# Patient Record
Sex: Female | Born: 1955 | Race: Black or African American | Hispanic: No | Marital: Married | Smoking: Former smoker
Health system: Southern US, Community
[De-identification: ages and names within clinical notes are randomized; demographics above are authoritative.]

## PROBLEM LIST (undated history)

## (undated) DIAGNOSIS — D696 Thrombocytopenia, unspecified: Secondary | ICD-10-CM

## (undated) DIAGNOSIS — M5430 Sciatica, unspecified side: Secondary | ICD-10-CM

## (undated) DIAGNOSIS — I10 Essential (primary) hypertension: Secondary | ICD-10-CM

## (undated) DIAGNOSIS — E119 Type 2 diabetes mellitus without complications: Secondary | ICD-10-CM

## (undated) DIAGNOSIS — E785 Hyperlipidemia, unspecified: Secondary | ICD-10-CM

## (undated) DIAGNOSIS — I480 Paroxysmal atrial fibrillation: Secondary | ICD-10-CM

## (undated) HISTORY — PX: ABDOMINAL HYSTERECTOMY: SHX81

## (undated) HISTORY — PX: OTHER SURGICAL HISTORY: SHX169

---

## 2008-08-22 ENCOUNTER — Ambulatory Visit: Payer: Self-pay | Admitting: Internal Medicine

## 2009-06-26 ENCOUNTER — Ambulatory Visit: Payer: Self-pay | Admitting: Internal Medicine

## 2010-06-04 ENCOUNTER — Ambulatory Visit: Payer: Self-pay | Admitting: Internal Medicine

## 2011-05-12 ENCOUNTER — Ambulatory Visit: Payer: Self-pay | Admitting: Internal Medicine

## 2011-05-22 ENCOUNTER — Ambulatory Visit: Payer: Self-pay | Admitting: Internal Medicine

## 2011-06-22 ENCOUNTER — Ambulatory Visit: Payer: Self-pay | Admitting: Internal Medicine

## 2011-07-23 ENCOUNTER — Ambulatory Visit: Payer: Self-pay | Admitting: Internal Medicine

## 2011-09-27 ENCOUNTER — Encounter: Payer: Self-pay | Admitting: Internal Medicine

## 2011-09-30 ENCOUNTER — Ambulatory Visit: Payer: Self-pay | Admitting: Internal Medicine

## 2011-10-22 ENCOUNTER — Encounter: Payer: Self-pay | Admitting: Internal Medicine

## 2012-10-03 ENCOUNTER — Ambulatory Visit: Payer: Self-pay | Admitting: Internal Medicine

## 2012-10-08 ENCOUNTER — Ambulatory Visit: Payer: Self-pay | Admitting: Internal Medicine

## 2013-02-18 ENCOUNTER — Ambulatory Visit: Payer: Self-pay | Admitting: Internal Medicine

## 2013-02-19 ENCOUNTER — Ambulatory Visit: Payer: Self-pay | Admitting: Internal Medicine

## 2013-10-10 ENCOUNTER — Ambulatory Visit: Payer: Self-pay | Admitting: Internal Medicine

## 2014-10-22 ENCOUNTER — Ambulatory Visit: Payer: Self-pay | Admitting: Internal Medicine

## 2015-09-30 ENCOUNTER — Other Ambulatory Visit: Payer: Self-pay | Admitting: Internal Medicine

## 2015-09-30 DIAGNOSIS — Z1231 Encounter for screening mammogram for malignant neoplasm of breast: Secondary | ICD-10-CM

## 2015-10-26 ENCOUNTER — Ambulatory Visit: Payer: Self-pay

## 2015-10-28 ENCOUNTER — Ambulatory Visit
Admission: RE | Admit: 2015-10-28 | Discharge: 2015-10-28 | Disposition: A | Discharge status: Home / Self Care | Payer: 59 | Source: Ambulatory Visit | Attending: Internal Medicine | Admitting: Internal Medicine

## 2015-10-28 DIAGNOSIS — Z1231 Encounter for screening mammogram for malignant neoplasm of breast: Secondary | ICD-10-CM | POA: Insufficient documentation

## 2016-05-17 DIAGNOSIS — Y999 Unspecified external cause status: Secondary | ICD-10-CM | POA: Insufficient documentation

## 2016-05-17 DIAGNOSIS — Y92410 Unspecified street and highway as the place of occurrence of the external cause: Secondary | ICD-10-CM | POA: Insufficient documentation

## 2016-05-17 DIAGNOSIS — M542 Cervicalgia: Secondary | ICD-10-CM | POA: Diagnosis present

## 2016-05-17 DIAGNOSIS — Y939 Activity, unspecified: Secondary | ICD-10-CM | POA: Diagnosis not present

## 2016-05-17 DIAGNOSIS — S134XXA Sprain of ligaments of cervical spine, initial encounter: Secondary | ICD-10-CM | POA: Diagnosis not present
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
## 2016-05-18 ENCOUNTER — Emergency Department: Payer: No Typology Code available for payment source

## 2016-05-18 ENCOUNTER — Emergency Department
Admission: EM | Admit: 2016-05-18 | Discharge: 2016-05-18 | Disposition: A | Discharge status: Home / Self Care | Payer: No Typology Code available for payment source | Attending: Emergency Medicine | Admitting: Emergency Medicine

## 2016-05-18 DIAGNOSIS — S134XXA Sprain of ligaments of cervical spine, initial encounter: Secondary | ICD-10-CM

## 2016-05-18 MED ORDER — CYCLOBENZAPRINE HCL 10 MG PO TABS
10.0000 mg | ORAL_TABLET | Freq: Once | ORAL | Status: AC
  Administered 2016-05-18: 10 mg via ORAL
  Filled 2016-05-18: qty 1

## 2016-05-18 MED ORDER — CYCLOBENZAPRINE HCL 10 MG PO TABS: 10.0000 mg | ORAL_TABLET | Freq: Three times a day (TID) | ORAL | Status: DC | PRN
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
## 2016-05-25 ENCOUNTER — Other Ambulatory Visit: Payer: Self-pay | Admitting: Specialist

## 2016-05-25 DIAGNOSIS — M542 Cervicalgia: Secondary | ICD-10-CM

## 2016-06-09 ENCOUNTER — Ambulatory Visit: Admission: RE | Admit: 2016-06-09 | Payer: 59 | Source: Ambulatory Visit

## 2016-06-22 ENCOUNTER — Ambulatory Visit
Admission: RE | Admit: 2016-06-22 | Discharge: 2016-06-22 | Disposition: A | Discharge status: Home / Self Care | Payer: 59 | Source: Ambulatory Visit | Attending: Specialist | Admitting: Specialist

## 2016-06-22 DIAGNOSIS — M47892 Other spondylosis, cervical region: Secondary | ICD-10-CM | POA: Insufficient documentation

## 2016-06-22 DIAGNOSIS — M5031 Other cervical disc degeneration,  high cervical region: Secondary | ICD-10-CM | POA: Insufficient documentation

## 2016-06-22 DIAGNOSIS — M542 Cervicalgia: Secondary | ICD-10-CM | POA: Diagnosis present

## 2016-06-22 DIAGNOSIS — M4803 Spinal stenosis, cervicothoracic region: Secondary | ICD-10-CM | POA: Diagnosis not present

## 2016-06-22 DIAGNOSIS — M5023 Other cervical disc displacement, cervicothoracic region: Secondary | ICD-10-CM | POA: Insufficient documentation

## 2016-11-01 ENCOUNTER — Ambulatory Visit: Payer: Self-pay | Admitting: Podiatry

## 2016-11-09 ENCOUNTER — Other Ambulatory Visit: Payer: Self-pay | Admitting: Internal Medicine

## 2016-11-09 DIAGNOSIS — Z1231 Encounter for screening mammogram for malignant neoplasm of breast: Secondary | ICD-10-CM

## 2016-12-14 ENCOUNTER — Ambulatory Visit
Admission: RE | Admit: 2016-12-14 | Discharge: 2016-12-14 | Disposition: A | Discharge status: Home / Self Care | Payer: 59 | Source: Ambulatory Visit | Attending: Internal Medicine | Admitting: Internal Medicine

## 2016-12-14 DIAGNOSIS — Z1231 Encounter for screening mammogram for malignant neoplasm of breast: Secondary | ICD-10-CM

## 2018-05-16 ENCOUNTER — Other Ambulatory Visit: Payer: Self-pay | Admitting: Internal Medicine

## 2018-05-16 DIAGNOSIS — Z1231 Encounter for screening mammogram for malignant neoplasm of breast: Secondary | ICD-10-CM

## 2018-06-01 ENCOUNTER — Ambulatory Visit
Admission: RE | Admit: 2018-06-01 | Discharge: 2018-06-01 | Disposition: A | Discharge status: Home / Self Care | Payer: BLUE CROSS/BLUE SHIELD | Source: Ambulatory Visit | Attending: Internal Medicine | Admitting: Internal Medicine

## 2018-06-01 DIAGNOSIS — Z1231 Encounter for screening mammogram for malignant neoplasm of breast: Secondary | ICD-10-CM | POA: Insufficient documentation

## 2019-10-24 ENCOUNTER — Other Ambulatory Visit: Payer: Self-pay

## 2019-10-24 DIAGNOSIS — Z20822 Contact with and (suspected) exposure to covid-19: Secondary | ICD-10-CM

## 2019-10-27 LAB — NOVEL CORONAVIRUS, NAA: SARS-CoV-2, NAA: NOT DETECTED

## 2020-04-29 ENCOUNTER — Other Ambulatory Visit: Payer: Self-pay

## 2020-04-29 ENCOUNTER — Encounter: Payer: Medicare HMO | Attending: Internal Medicine | Admitting: Internal Medicine

## 2020-04-29 DIAGNOSIS — E11621 Type 2 diabetes mellitus with foot ulcer: Secondary | ICD-10-CM | POA: Insufficient documentation

## 2020-04-29 DIAGNOSIS — E1142 Type 2 diabetes mellitus with diabetic polyneuropathy: Secondary | ICD-10-CM | POA: Diagnosis not present

## 2020-04-29 DIAGNOSIS — L97522 Non-pressure chronic ulcer of other part of left foot with fat layer exposed: Secondary | ICD-10-CM | POA: Insufficient documentation

## 2020-04-29 DIAGNOSIS — F17200 Nicotine dependence, unspecified, uncomplicated: Secondary | ICD-10-CM | POA: Diagnosis not present

## 2020-04-29 DIAGNOSIS — L97529 Non-pressure chronic ulcer of other part of left foot with unspecified severity: Secondary | ICD-10-CM | POA: Diagnosis present
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
## 2020-05-01 NOTE — Progress Notes (Signed)
BATOOL, MAJID ([id number]) Visit Report for 04/29/2020 Allergy List Details Patient Name: Yolanda Harris Date of Service: 04/29/2020 2:15 PM Medical Record Number: [id number] Patient Account Number: [id number] Date of Birth/Sex: [birth year] (64 y.o. F) Treating RN: Yolanda Harris Primary Care Yolanda Harris: Yolanda Harris Other Clinician: Referring Yolanda Harris: Yolanda Harris Treating Yolanda Harris/Extender: Yolanda Harris in Treatment: 0 Allergies Active Allergies Sulfa (Sulfonamide Antibiotics) Allergy Notes Electronic Signature(s) Signed: 04/29/2020 4:13:29 PM By: Yolanda Harris Entered By: Yolanda Harris on 04/29/2020 14:34:07 Yolanda Harris ([id number]) -------------------------------------------------------------------------------- Arrival Information Details Patient Name: Yolanda Harris Date of Service: 04/29/2020 2:15 PM Medical Record Number: [id number] Patient Account Number: [id number] Date of Birth/Sex: [birth year] (63 y.o. F) Treating RN: Yolanda Harris Primary Care Yolanda Harris: Yolanda Harris Other Clinician: Referring Cayne Yom: Yolanda Harris Treating Yolanda Harris: Yolanda Harris in Treatment: 0 Visit Information Patient Arrived: Ambulatory Arrival Time: 14:27 Accompanied By: daughter Transfer Assistance: None Patient Identification Verified: Yes Secondary Verification Process Completed: Yes Electronic Signature(s) Signed: 04/30/2020 11:39:41 AM By: Yolanda Harris Yolanda Harris Entered By: Yolanda Harris on 04/29/2020 14:29:48 Yolanda Harris ([id number]) -------------------------------------------------------------------------------- Clinic Level of Care Assessment Details Patient Name: Yolanda Harris Date of Service: 04/29/2020 2:15 PM Medical Record Number: [id number] Patient Account Number: [id number] Date of Birth/Sex: 07/11/1956 (63 y.o. F) Treating RN: Yolanda Harris Primary Care Tiesha Marich:  Yolanda Harris Other Clinician: Referring Yolanda Harris: Yolanda Harris Treating Yolanda Harris: Yolanda Harris in Treatment: 0 Clinic Level of Care Assessment Items TOOL 2 Quantity Score []  - Use when only an EandM is performed on the INITIAL visit 0 ASSESSMENTS - Nursing Assessment / Reassessment X - General Physical Exam (combine w/ comprehensive assessment (listed just below) when performed on new 1 20 pt. evals) X- 1 25 Comprehensive Assessment (HX, ROS, Risk Assessments, Wounds Hx, etc.) ASSESSMENTS - Wound and Skin Assessment / Reassessment X - Simple Wound Assessment / Reassessment - one wound 1 5 []  - 0 Complex Wound Assessment / Reassessment - multiple wounds []  - 0 Dermatologic / Skin Assessment (not related to wound area) ASSESSMENTS - Ostomy and/or Continence Assessment and Care []  - Incontinence Assessment and Management 0 []  - 0 Ostomy Care Assessment and Management (repouching, etc.) PROCESS - Coordination of Care X - Simple Patient / Family Education for ongoing care 1 15 []  - 0 Complex (extensive) Patient / Family Education for ongoing care []  - 0 Staff obtains Programmer, systems, Records, Test Results / Process Orders []  - 0 Staff telephones HHA, Nursing Homes / Clarify orders / etc []  - 0 Routine Transfer to another Facility (non-emergent condition) []  - 0 Routine Hospital Admission (non-emergent condition) X- 1 15 New Admissions / Biomedical engineer / Ordering NPWT, Apligraf, etc. []  - 0 Emergency Hospital Admission (emergent condition) []  - 0 Simple Discharge Coordination []  - 0 Complex (extensive) Discharge Coordination PROCESS - Special Needs []  - Pediatric / Minor Patient Management 0 []  - 0 Isolation Patient Management []  - 0 Hearing / Language / Visual special needs []  - 0 Assessment of Community assistance (transportation, D/C planning, etc.) []  - 0 Additional assistance / Altered mentation []  - 0 Support Surface(s) Assessment  (bed, cushion, seat, etc.) INTERVENTIONS - Wound Cleansing / Measurement X - Wound Imaging (photographs - any number of wounds) 1 5 []  - 0 Wound Tracing (instead of photographs) X- 1 5 Simple Wound Measurement - one wound []  - 0 Complex Wound Measurement - multiple wounds Tallent, Raejean E. ([id number]) X- 1 5 Simple Wound Cleansing - one wound []  -  0 Complex Wound Cleansing - multiple wounds INTERVENTIONS - Wound Dressings X - Small Wound Dressing one or multiple wounds 1 10 []  - 0 Medium Wound Dressing one or multiple wounds []  - 0 Large Wound Dressing one or multiple wounds []  - 0 Application of Medications - injection INTERVENTIONS - Miscellaneous []  - External ear exam 0 []  - 0 Specimen Collection (cultures, biopsies, blood, body fluids, etc.) []  - 0 Specimen(s) / Culture(s) sent or taken to Lab for analysis []  - 0 Patient Transfer (multiple staff / / Similar devices) []  - 0 Simple Staple / Suture removal (25 or less) []  - 0 Complex Staple / Suture removal (26 or more) []  - 0 Hypo / Hyperglycemic Management (close monitor of Blood Glucose) []  - 0 Ankle / Brachial Index (ABI) - do not check if billed separately Has the patient been seen at the hospital within the last three years: Yes Total Score: 105 Level Of Care: New/Established - Level 3 Electronic Signature(s) Signed: 05/01/2020 12:58:54 PM By: , BSN, RN, CWS, Kim RN, BSN Entered By: , BSN, RN, CWS, Kim on 04/29/2020 15:17:18 ( ) -------------------------------------------------------------------------------- Encounter Discharge Information Details Patient Name: Yolanda Harris, Yolanda Harris Date of Service: 04/29/2020 2:15 PM Medical Record Number: Patient Account Number: Date of Birth/Sex: 04/18/1956 (63 y.o. F) Treating RN: Yolanda Harris Primary Care Yolanda Harris: Yolanda Harris Other Clinician: Referring Yolanda Harris: 06/29/2020 Treating  Yolanda Harris/Extender: Yolanda Harris in Treatment: 0 Encounter Discharge Information Items Discharge Condition: Stable Ambulatory Status: Ambulatory Discharge Destination: Home Transportation: Private Auto Accompanied By: daughter Schedule Follow-up Appointment: Yes Clinical Summary of Care: Electronic Signature(s) Signed: 05/01/2020 12:58:54 PM By: Yolanda Harris, BSN, RN, CWS, Kim RN, BSN Entered By: 06/29/2020, BSN, RN, CWS, Kim on 04/29/2020 15:18:02 Yolanda Harris (10/21/1956) -------------------------------------------------------------------------------- Lower Extremity Assessment Details Patient Name: Yolanda Harris Date of Service: 04/29/2020 2:15 PM Medical Record Number: Yolanda Harris Patient Account Number: Yolanda Harris Date of Birth/Sex: 06-30-1956 (63 y.o. F) Treating RN: Yolanda Harris Primary Care Sarenity Ramaker: Yolanda Harris Other Clinician: Referring Adalaide Jaskolski: 06/29/2020 Treating Kiing Deakin/Extender: Yolanda Harris in Treatment: 0 Edema Assessment Assessed: [Left: No] [Right: No] Edema: [Left: Yes] [Right: Yes] Calf Left: Right: Point of Measurement: 34 cm From Medial Instep 39.5 cm 39 cm Ankle Left: Right: Point of Measurement: 12 cm From Medial Instep 27 cm 25 cm Vascular Assessment Pulses: Dorsalis Pedis Palpable: [Left:Yes] [Right:Yes] Doppler Audible: [Left:Yes] [Right:Yes] Posterior Tibial Palpable: [Left:Yes] [Right:Yes] Doppler Audible: [Left:Yes] [Right:Yes] Blood Pressure: Brachial: [Left:108] Dorsalis Pedis: 172 [Left:Dorsalis Pedis: 178] Ankle: Posterior Tibial: 154 [Left:Posterior Tibial: 148 1.59] [Right:1.65] Electronic Signature(s) Signed: 04/29/2020 4:13:29 PM By: Yolanda Harris Entered By: 06/29/2020 on 04/29/2020 14:59:34 Yolanda Harris, Yolanda Harris (10/21/1956) -------------------------------------------------------------------------------- Multi Wound Chart Details Patient Name: Yolanda Harris Date of Service: 04/29/2020  2:15 PM Medical Record Number: Yolanda Harris Patient Account Number: Yolanda Harris Date of Birth/Sex: Yolanda Harris22-1957 (63 y.o. F) Treating RN: Curtis Sites Primary Care Dann Galicia: Curtis Sites Other Clinician: Referring Lankford Gutzmer: 06/29/2020 Treating Laquia Rosano/Extender: Thornell Mule in Treatment: 0 Vital Signs Height(in): 68 Pulse(bpm): 77 Weight(lbs): 246 Blood Pressure(mmHg): 138/58 Body Mass Index(BMI): 37 Temperature(F): 98.6 Respiratory Rate(breaths/min): 18 Photos: [N/A:N/A] Wound Location: Left, Plantar Metatarsal head first N/A N/A Wounding Event: Blister N/A N/A Primary Etiology: Diabetic Wound/Ulcer of the Lower N/A N/A Extremity Comorbid History: Type II Diabetes, Neuropathy N/A N/A Date Acquired: 04/19/2020 N/A N/A Weeks of Treatment: 0 N/A N/A Wound Status: Open N/A N/A Measurements L x W x D (cm) 1.3x1x0.1 N/A  N/A Area (cm) : 1.021 N/A N/A Volume (cm) : 0.102 N/A N/A Classification: Grade 2 N/A N/A Exudate Amount: Medium N/A N/A Exudate Type: Serous N/A N/A Exudate Color: amber N/A N/A Wound Margin: Flat and Intact N/A N/A Granulation Amount: Large (67-100%) N/A N/A Granulation Quality: Pink N/A N/A Necrotic Amount: None Present (0%) N/A N/A Exposed Structures: Fat Layer (Subcutaneous Tissue) N/A N/A Exposed: Yes Fascia: No Tendon: No Muscle: No Joint: No Bone: No Epithelialization: None N/A N/A Treatment Notes Wound #1 (Left, Plantar Metatarsal head first) Notes scell, felt, conform, peg assist Electronic Signature(s) Signed: 04/29/2020 4:11:49 PM By: Baltazar Najjar MD Entered By: Baltazar Najjar on 04/29/2020 15:47:39 Yolanda Harris, Yolanda Harris ([id number]) Yolanda Harris, Yolanda Harris ([id number]) -------------------------------------------------------------------------------- Multi-Disciplinary Care Plan Details Patient Name: Yolanda Harris Date of Service: 04/29/2020 2:15 PM Medical Record Number: [id number] Patient Account Number: Yolanda Harris Date  of Birth/Sex: 06-09-1956 (63 y.o. F) Treating RN: Huel Coventry Primary Care Douglas Smolinsky: Yolanda Harris Other Clinician: Referring Zaakirah Kistner: Yolanda Harris Treating Shikara Mcauliffe/Extender: Altamese [place] in Treatment: 0 Active Inactive Orientation to the Wound Care Program Nursing Diagnoses: Knowledge deficit related to the wound healing center program Goals: Patient/caregiver will verbalize understanding of the Wound Healing Center Program Date Initiated: 04/29/2020 Target Resolution Date: 05/29/2020 Goal Status: Active Interventions: Provide education on orientation to the wound center Notes: Pressure Nursing Diagnoses: Knowledge deficit related to management of pressures ulcers Goals: Patient/caregiver will verbalize risk factors for pressure ulcer development Date Initiated: 04/29/2020 Target Resolution Date: 05/29/2020 Goal Status: Active Interventions: Assess potential for pressure ulcer upon admission and as needed Notes: Wound/Skin Impairment Nursing Diagnoses: Impaired tissue integrity Goals: Ulcer/skin breakdown will have a volume reduction of 30% by week 4 Date Initiated: 04/29/2020 Target Resolution Date: 05/29/2020 Goal Status: Active Interventions: Assess ulceration(s) every visit Notes: Electronic Signature(s) Signed: 05/01/2020 12:58:54 PM By: Yolanda Harris, BSN, RN, CWS, Kim RN, BSN Entered By: Yolanda Harris, BSN, RN, CWS, Kim on 04/29/2020 15:15:07 Yolanda Harris ([id number]) -------------------------------------------------------------------------------- Pain Assessment Details Patient Name: Yolanda Harris Date of Service: 04/29/2020 2:15 PM Medical Record Number: [id number] Patient Account Number: Yolanda Harris Date of Birth/Sex: October 29, 1956 (63 y.o. F) Treating RN: Huel Coventry Primary Care Avyukth Bontempo: Yolanda Harris Other Clinician: Referring Elisa Kutner: Yolanda Harris Treating Chen Saadeh/Extender: Altamese [place] in Treatment: 0 Active Problems Location  of Pain Severity and Description of Pain Patient Has Paino No Site Locations Pain Management and Medication Current Pain Management: Electronic Signature(s) Signed: 04/30/2020 11:39:41 AM By: Dayton Martes Yolanda Harris Signed: 05/01/2020 12:58:54 PM By: Yolanda Harris, BSN, RN, CWS, Kim RN, BSN Entered By: Dayton Martes on 04/29/2020 14:30:06 Yolanda Harris ([id number]) -------------------------------------------------------------------------------- Patient/Caregiver Education Details Patient Name: Yolanda Harris Date of Service: 04/29/2020 2:15 PM Medical Record Number: [id number] Patient Account Number: Yolanda Harris Date of Birth/Gender: [birth year] (63 y.o. F) Treating RN: Huel Coventry Primary Care Physician: Yolanda Harris Other Clinician: Referring Physician: Bluford Harris Treating Physician/Extender: Altamese [place] in Treatment: 0 Education Assessment Education Provided To: Patient Education Topics Provided Wound/Skin Impairment: Handouts: Caring for Your Ulcer Methods: Demonstration, Explain/Verbal Responses: State content correctly Electronic Signature(s) Signed: 05/01/2020 12:58:54 PM By: Yolanda Harris, BSN, RN, CWS, Kim RN, BSN Entered By: Yolanda Harris, BSN, RN, CWS, Kim on 04/29/2020 15:17:30 Yolanda Harris ([id number]) -------------------------------------------------------------------------------- Wound Assessment Details Patient Name: Yolanda Harris Date of Service: 04/29/2020 2:15 PM Medical Record Number: [id number] Patient Account Number: Yolanda Harris Date of Birth/Sex: 03-16-1956 (63 y.o. F) Treating RN: Curtis Sites Primary Care Marquel Pottenger: Yolanda Harris Other Clinician: Referring Matalyn Nawaz: Yolanda Harris  Treating Derrius Furtick/Extender: Maxwell Caul Weeks in Treatment: 0 Wound Status Wound Number: 1 Primary Etiology: Diabetic Wound/Ulcer of the Lower Extremity Wound Location: Left, Plantar Metatarsal head first Wound  Status: Open Wounding Event: Blister Comorbid History: Type II Diabetes, Neuropathy Date Acquired: 04/19/2020 Weeks Of Treatment: 0 Clustered Wound: No Photos Wound Measurements Length: (cm) 1.3 Width: (cm) 1 Depth: (cm) 0.1 Area: (cm) 1.021 Volume: (cm) 0.102 % Reduction in Area: % Reduction in Volume: Epithelialization: None Tunneling: No Undermining: No Wound Description Classification: Grade 2 Wound Margin: Flat and Intact Exudate Amount: Medium Exudate Type: Serous Exudate Color: amber Foul Odor After Cleansing: No Slough/Fibrino No Wound Bed Granulation Amount: Large (67-100%) Exposed Structure Granulation Quality: Pink Fascia Exposed: No Necrotic Amount: None Present (0%) Fat Layer (Subcutaneous Tissue) Exposed: Yes Tendon Exposed: No Muscle Exposed: No Joint Exposed: No Bone Exposed: No Treatment Notes Wound #1 (Left, Plantar Metatarsal head first) Notes scell, felt, conform, peg assist Electronic Signature(s) Signed: 04/29/2020 4:13:29 PM By: Margarita Mail, Thornell Mule ([id number]) Entered By: Curtis Sites on 04/29/2020 15:04:24 Yolanda Harris ([id number]) -------------------------------------------------------------------------------- Vitals Details Patient Name: Yolanda Harris Date of Service: 04/29/2020 2:15 PM Medical Record Number: [id number] Patient Account Number: Yolanda Harris Date of Birth/Sex: March 13, 1956 (63 y.o. F) Treating RN: Huel Coventry Primary Care Maja Mccaffery: Yolanda Harris Other Clinician: Referring Joffre Lucks: Yolanda Harris Treating Maico Mulvehill/Extender: Altamese  in Treatment: 0 Vital Signs Time Taken: 14:30 Temperature (F): 98.6 Height (in): 68 Pulse (bpm): 77 Source: Stated Respiratory Rate (breaths/min): 18 Weight (lbs): 246 Blood Pressure (mmHg): 138/58 Source: Measured Reference Range: 80 - 120 mg / dl Body Mass Index (BMI): 37.4 Electronic Signature(s) Signed: 04/30/2020 11:39:41 AM By:  Dayton Martes Yolanda Harris Entered By: Dayton Martes on 04/29/2020 14:30:53

## 2020-05-01 NOTE — Progress Notes (Signed)
Yolanda Harris, Yolanda Harris ([id number]) Visit Report for 04/29/2020 HPI Details Patient Name: Yolanda Harris, Yolanda Harris Date of Service: 04/29/2020 2:15 PM Medical Record Number: [id number] Patient Account Number: [id number] Date of Birth/Sex: 1956/03/21 (64 y.o. F) Treating RN: Huel Coventry Primary Care Provider: Bluford Main Other Clinician: Referring Provider: Bluford Main Treating Provider/Extender: Altamese [place] in Treatment: 0 History of Present Illness HPI Description: ADMISSION 04/29/2020 . This is a 64 year old woman who was vacationing at [facility] She got home and then the next day noted drainage from her foot she discovered she had an ulcer on her left first metatarsal head. 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 As far as we know her type 2 diabetes is otherwise been under good control. She has been cleaning this and applying a Band-Aid. Past medical history includes type 2 diabetes with neuropathy, history of venous insufficiency, she is a smoker ABIs in our clinic were 1.65 on the right and 1.59 on the left Electronic Signature(s) Signed: 04/29/2020 4:11:49 PM By: Baltazar Najjar MD Entered By: Baltazar Najjar on 04/29/2020 15:49:16 Yolanda Harris ([id number]) -------------------------------------------------------------------------------- Physical Exam Details Patient Name: Yolanda Harris Date of Service: 04/29/2020 2:15 PM Medical Record Number: [id number] Patient Account Number: [id number] Date of Birth/Sex: 12-19-1955 (64 y.o. F) Treating RN: Huel Coventry Primary Care Provider: Bluford Main Other Clinician: Referring Provider: Bluford Main Treating Provider/Extender: Altamese [place] in Treatment: 0 Constitutional Sitting or standing Blood Pressure is within target range for patient.. Pulse regular and within target range for patient.Marland Kitchen Respirations regular, non- labored and within target  range.. Temperature is normal and within the target range for the patient.Marland Kitchen appears in no distress. Cardiovascular Pedal pulses easily palpable at the dorsalis pedis and posterior tibial. Changes of chronic venous insufficiency. Integumentary (Hair, Skin) Skin changes of chronic venous insufficiency with hemosiderin deposition bilaterally no other skin issues are seen. Neurological Marked reduction in sensation to the microfilament test. Psychiatric No evidence of depression, anxiety, or agitation. Calm, cooperative, and communicative. Appropriate interactions and affect.. Notes Wound exam; the area questions over the plantar left first metatarsal head. Really a full-thickness wound but clean base. No debridement was required. There is no evidence of surrounding infection. Electronic Signature(s) Signed: 04/29/2020 4:11:49 PM By: Baltazar Najjar MD Entered By: Baltazar Najjar on 04/29/2020 15:51:30 Yolanda Harris ([id number]) -------------------------------------------------------------------------------- Physician Orders Details Patient Name: Yolanda Harris Date of Service: 04/29/2020 2:15 PM Medical Record Number: [id number] Patient Account Number: [id number] Date of Birth/Sex: 1956/10/15 (64 y.o. F) Treating RN: Huel Coventry Primary Care Provider: Bluford Main Other Clinician: Referring Provider: Bluford Main Treating Provider/Extender: Altamese [place] in Treatment: 0 Verbal / Phone Orders: No Diagnosis Coding Wound Cleansing Wound #1 Left,Plantar Metatarsal head first o Clean wound with Normal Saline. - in office o Cleanse wound with mild soap and water Primary Wound Dressing Wound #1 Left,Plantar Metatarsal head first o Silver Alginate Secondary Dressing Wound #1 Left,Plantar Metatarsal head first o Conform/Kerlix o Other - felt Dressing Change Frequency Wound #1 Left,Plantar Metatarsal head first o Change dressing every day. Follow-up  Appointments Wound #1 Left,Plantar Metatarsal head first o Return Appointment in 1 week. Off-Loading Wound #1 Left,Plantar Metatarsal head first o Open toe surgical shoe with peg assist. Electronic Signature(s) Signed: 04/29/2020 4:11:49 PM By: Baltazar Najjar MD Signed: 05/01/2020 12:58:54 PM By: Elliot Gurney, BSN, RN, CWS, Kim RN, BSN Entered By: Elliot Gurney, BSN, RN, CWS, Kim on 04/29/2020 15:16:47 Yolanda Harris ([id number]) --------------------------------------------------------------------------------  Problem List Details Patient Name: Yolanda Harris, Yolanda Harris Date of Service: 04/29/2020 2:15 PM Medical Record Number: [id number] Patient Account Number: [id number] Date of Birth/Sex: 1956/10/04 (64 y.o. 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 F) Treating RN: Cornell Barman Primary Care Provider: Pernell Dupre Other Clinician: Referring Provider: Pernell Dupre Treating Provider/Extender: Tito Dine in Treatment: 0 Subjective History of  Present Illness (HPI) ADMISSION 04/29/2020 . This is a 64 year old woman who was vacationing at [facility]. She got home and then the next day noted drainage from her foot she discovered she had an ulcer on her left first metatarsal head. 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 As far as we know her type 2 diabetes is otherwise been under good control. She has been cleaning this and applying a Band-Aid. Past medical history includes type 2 diabetes with neuropathy, history of venous insufficiency, she is a smoker ABIs in our clinic were 1.65 on the right and 1.59 on the left Patient History Information obtained from Patient. Allergies Sulfa (Sulfonamide Antibiotics) Family History Heart Disease - Father, Hypertension - Father, Stroke - Mother, No family history of Cancer, Diabetes, Hereditary Spherocytosis, Kidney Disease, Lung Disease, Seizures, Thyroid Problems, Tuberculosis. Social History Current every day smoker - At least 5 yrs ago, Marital Status - Married, Alcohol Use - Never, Drug Use - No History, Caffeine Use - Daily. Medical History Eyes Denies history of Cataracts, Glaucoma, Optic Neuritis Endocrine Patient has history of Type II Diabetes Integumentary (Skin) Denies history of History of Burn, History of pressure wounds Neurologic Patient has history of Neuropathy Patient is treated with Insulin, Oral Agents. Blood sugar is tested. Blood sugar results noted at the following times: Breakfast - 97, Lunch - 84, Dinner - 143. Hospitalization/Surgery History - No. Review of Systems (ROS) Eyes Complains or has symptoms of Glasses / Contacts - Wearing glasses. Ear/Nose/Mouth/Throat Denies complaints or symptoms of Difficult clearing ears, Sinusitis. Hematologic/Lymphatic Denies complaints or symptoms of Bleeding / Clotting Disorders, Human Immunodeficiency Virus. Respiratory Denies complaints or symptoms of Chronic or  frequent coughs, Shortness of Breath. Cardiovascular Denies complaints or symptoms of Chest pain, LE edema. Gastrointestinal Denies complaints or symptoms of Frequent diarrhea, Nausea, Vomiting. Endocrine Complains or has symptoms of Polydypsia (Excessive Thirst). Denies complaints or symptoms of Hepatitis, Thyroid disease. Genitourinary Denies complaints or symptoms of Kidney failure/ Dialysis, Incontinence/dribbling. Immunological Denies complaints or symptoms of Hives, Itching. Integumentary (Skin) Complains or has symptoms of Wounds, Swelling. Denies complaints or symptoms of Bleeding or bruising tendency, Breakdown. Musculoskeletal Yolanda Harris, Yolanda E. ([id number]) Denies complaints or symptoms of Muscle Pain, Muscle Weakness. Neurologic Denies complaints or symptoms of Numbness/parasthesias, Focal/Weakness. Psychiatric Denies complaints or symptoms of Anxiety, Claustrophobia. Objective Constitutional Sitting or standing Blood Pressure is within target  range for patient.. Pulse regular and within target range for patient.Marland Kitchen Respirations regular, non- labored and within target range.. Temperature is normal and within the target range for the patient.Marland Kitchen appears in no distress. Vitals Time Taken: 2:30 PM, Height: 68 in, Source: Stated, Weight: 246 lbs, Source: Measured, BMI: 37.4, Temperature: 98.6 F, Pulse: 77 bpm, Respiratory Rate: 18 breaths/min, Blood Pressure: 138/58 mmHg. Cardiovascular Pedal pulses easily palpable at the dorsalis pedis and posterior tibial. Changes of chronic venous insufficiency. Neurological Marked reduction in sensation to the microfilament test. Psychiatric No evidence of depression, anxiety, or agitation. Calm, cooperative, and communicative. Appropriate interactions and affect.. General Notes: Wound exam; the area questions over the plantar left first metatarsal head. Really a full-thickness wound but clean base. No debridement was required. There is  no evidence of surrounding infection. Integumentary (Hair, Skin) Skin changes of chronic venous insufficiency with hemosiderin deposition bilaterally no other skin issues are seen. Wound #1 status is Open. Original cause of wound was Blister. The wound is located on the Left,Plantar Metatarsal head first. The wound measures 1.3cm length x 1cm width x 0.1cm depth; 1.021cm^2 area and 0.102cm^3 volume. There is Fat Layer (Subcutaneous Tissue) Exposed exposed. There is no tunneling or undermining noted. There is a medium amount of serous drainage noted. The wound margin is flat and intact. There is large (67-100%) pink granulation within the wound bed. There is no necrotic tissue within the wound bed. Assessment Active Problems ICD-10 Type 2 diabetes mellitus with foot ulcer Non-pressure chronic ulcer of other part of left foot with fat layer exposed Type 2 diabetes mellitus with diabetic polyneuropathy Plan Wound Cleansing: Wound #1 Left,Plantar Metatarsal head first: Clean wound with Normal Saline. - in office Cleanse wound with mild soap and water Primary Wound Dressing: Wound #1 Left,Plantar Metatarsal head first: Silver Alginate Secondary Dressing: Wound #1 Left,Plantar Metatarsal head first: Conform/Kerlix Other - felt Nield, Anniebell E. ([id number]) Dressing Change Frequency: Wound #1 Left,Plantar Metatarsal head first: Change dressing every day. Follow-up Appointments: Wound #1 Left,Plantar Metatarsal head first: Return Appointment in 1 week. Off-Loading: Wound #1 Left,Plantar Metatarsal head first: Open toe surgical shoe with peg assist. 1. We are going to use silver alginate, Kerlix and conform 2. We gave her a Peg assist offloading shoe so we can offload this area 3. I cautioned her against excessive pressure on this wound area. 4. There was no evidence of infection no antibiotics were necessary. 5. Her ABIs were noncompressible however I will see how the wound  progresses before I consider formal arterial studies. Her peripheral pulses were easily palpable. Forefoot was warm. 6. This is the type of wound that may require a total contact cast although I did not bring that up for discussion with her today. I am hopeful will be able to avoid this by using offloading sandals or perhaps a forefoot off loader if the shoe does not work I spent 35 minutes in review of this patient's past medical history, face-to-face evaluation and preparation of this record Electronic Signature(s) Signed: 04/29/2020 4:11:49 PM By: Baltazar Najjar MD Entered By: Baltazar Najjar on 04/29/2020 15:54:06 Yolanda Harris ([id number]) -------------------------------------------------------------------------------- ROS/PFSH Details Patient Name: Yolanda Harris Date of Service: 04/29/2020 2:15 PM Medical Record Number: [id number] Patient Account Number: [id number] Date of Birth/Sex: 18-Oct-1956 (63 y.o. F) Treating RN: Curtis Sites Primary Care Provider: Bluford Main Other Clinician: Referring Provider: Bluford Main Treating Provider/Extender: Altamese [place] in Treatment: 0 Information Obtained From Patient Eyes Complaints and Symptoms: Positive for: Glasses /  Contacts - Wearing glasses Medical History: Negative for: Cataracts; Glaucoma; Optic Neuritis Ear/Nose/Mouth/Throat Complaints and Symptoms: Negative for: Difficult clearing ears; Sinusitis Hematologic/Lymphatic Complaints and Symptoms: Negative for: Bleeding / Clotting Disorders; Human Immunodeficiency Virus Respiratory Complaints and Symptoms: Negative for: Chronic or frequent coughs; Shortness of Breath Cardiovascular Complaints and Symptoms: Negative for: Chest pain; LE edema Gastrointestinal Complaints and Symptoms: Negative for: Frequent diarrhea; Nausea; Vomiting Endocrine Complaints and Symptoms: Positive for: Polydypsia (Excessive Thirst) Negative for: Hepatitis; Thyroid  disease Medical History: Positive for: Type II Diabetes Time with diabetes: About 10 years Treated with: Insulin, Oral agents Blood sugar tested every day: Yes Tested : BID Blood sugar testing results: Breakfast: 97; Lunch: 84; Dinner: 143 Genitourinary Complaints and Symptoms: Negative for: Kidney failure/ Dialysis; Incontinence/dribbling Immunological Yolanda Harris, Yolanda E. ([id number]) Complaints and Symptoms: Negative for: Hives; Itching Integumentary (Skin) Complaints and Symptoms: Positive for: Wounds; Swelling Negative for: Bleeding or bruising tendency; Breakdown Medical History: Negative for: History of Burn; History of pressure wounds Musculoskeletal Complaints and Symptoms: Negative for: Muscle Pain; Muscle Weakness Neurologic Complaints and Symptoms: Negative for: Numbness/parasthesias; Focal/Weakness Medical History: Positive for: Neuropathy Psychiatric Complaints and Symptoms: Negative for: Anxiety; Claustrophobia Oncologic Immunizations Pneumococcal Vaccine: Received Pneumococcal Vaccination: Yes Immunization Notes: Up to date. Implantable Devices No devices added Hospitalization / Surgery History Type of Hospitalization/Surgery No Family and Social History Cancer: No; Diabetes: No; Heart Disease: Yes - Father; Hereditary Spherocytosis: No; Hypertension: Yes - Father; Kidney Disease: No; Lung Disease: No; Seizures: No; Stroke: Yes - Mother; Thyroid Problems: No; Tuberculosis: No; Current every day smoker - At least 5 yrs ago; Marital Status - Married; Alcohol Use: Never; Drug Use: No History; Caffeine Use: Daily Electronic Signature(s) Signed: 04/29/2020 4:11:49 PM By: Baltazar Najjar MD Signed: 04/29/2020 4:13:29 PM By: Curtis Sites Entered By: Curtis Sites on 04/29/2020 14:50:58 Yolanda Harris ([id number]) -------------------------------------------------------------------------------- SuperBill Details Patient Name: Yolanda Harris Date of  Service: 04/29/2020 Medical Record Number: [id number] Patient Account Number: [id number] Date of Birth/Sex: 1956-06-05 (63 y.o. F) Treating RN: Huel Coventry Primary Care Provider: Bluford Main Other Clinician: Referring Provider: Bluford Main Treating Provider/Extender: Altamese [place] in Treatment: 0 Diagnosis Coding ICD-10 Codes Code Description E11.621 Type 2 diabetes mellitus with foot ulcer L97.522 Non-pressure chronic ulcer of other part of left foot with fat layer exposed E11.42 Type 2 diabetes mellitus with diabetic polyneuropathy Facility Procedures CPT4 Code: [id number] Description: 99213 - WOUND CARE VISIT-LEV 3 EST PT Modifier: Quantity: 1 Physician Procedures CPT4 Code: [id number] Description: WC PHYS LEVEL 3 o NEW PT Modifier: Quantity: 1 CPT4 Code: Description: ICD-10 Diagnosis Description E11.621 Type 2 diabetes mellitus with foot ulcer L97.522 Non-pressure chronic ulcer of other part of left foot with fat layer E11.42 Type 2 diabetes mellitus with diabetic polyneuropathy Modifier: exposed Quantity: Electronic Signature(s) Signed: 04/29/2020 4:11:49 PM By: Baltazar Najjar MD Entered By: Baltazar Najjar on 04/29/2020 15:57:02

## 2020-05-06 ENCOUNTER — Encounter: Payer: Medicare HMO | Admitting: Internal Medicine

## 2020-05-06 ENCOUNTER — Other Ambulatory Visit: Payer: Self-pay

## 2020-05-06 DIAGNOSIS — E11621 Type 2 diabetes mellitus with foot ulcer: Secondary | ICD-10-CM | POA: Diagnosis not present

## 2020-05-06 NOTE — Progress Notes (Signed)
SAGAL, GAYTON ([id number]) Visit Report for 05/06/2020 Arrival Information Details Patient Name: Yolanda Harris, Yolanda Harris Date of Service: 05/06/2020 8:15 AM Medical Record Number: [id number] Patient Account Number: [id number] Date of Birth/Sex: 1955-12-15 (63 y.o. F) Treating RN: Montey Hora Primary Care Brionne Mertz: Pernell Dupre Other Clinician: Referring Neddie Steedman: Pernell Dupre Treating Treena Cosman/Extender: Tito Dine in Treatment: 1 Visit Information History Since Last Visit Added or deleted any medications: No Patient Arrived: Ambulatory Any new allergies or adverse reactions: No Arrival Time: 08:17 Had a fall or experienced change in No Accompanied By: daughter activities of daily living that may affect Transfer Assistance: None risk of falls: Patient Identification Verified: Yes Signs or symptoms of abuse/neglect since last visito No Secondary Verification Process Completed: Yes Hospitalized since last visit: No Implantable device outside of the clinic excluding No cellular tissue based products placed in the center since last visit: Has Dressing in Place as Prescribed: Yes Pain Present Now: No Electronic Signature(s) Signed: 05/06/2020 4:35:33 PM By: Montey Hora Entered By: Montey Hora on 05/06/2020 08:19:47 Yolanda Harris ([id number]) -------------------------------------------------------------------------------- Clinic Level of Care Assessment Details Patient Name: Yolanda Harris Date of Service: 05/06/2020 8:15 AM Medical Record Number: [id number] Patient Account Number: [id number] Date of Birth/Sex: [birth year] (63 y.o. F) Treating RN: Cornell Barman Primary Care Collins Dimaria: Pernell Dupre Other Clinician: Referring Aland Chestnutt: Pernell Dupre Treating Kolby Myung/Extender: Tito Dine in Treatment: 1 Clinic Level of Care Assessment Items TOOL 4 Quantity Score []  - Use when only an EandM is performed on FOLLOW-UP visit  0 ASSESSMENTS - Nursing Assessment / Reassessment X - Reassessment of Co-morbidities (includes updates in patient status) 1 10 X- 1 5 Reassessment of Adherence to Treatment Plan ASSESSMENTS - Wound and Skin Assessment / Reassessment X - Simple Wound Assessment / Reassessment - one wound 1 5 []  - 0 Complex Wound Assessment / Reassessment - multiple wounds []  - 0 Dermatologic / Skin Assessment (not related to wound area) ASSESSMENTS - Focused Assessment []  - Circumferential Edema Measurements - multi extremities 0 []  - 0 Nutritional Assessment / Counseling / Intervention []  - 0 Lower Extremity Assessment (monofilament, tuning fork, pulses) []  - 0 Peripheral Arterial Disease Assessment (using hand held doppler) ASSESSMENTS - Ostomy and/or Continence Assessment and Care []  - Incontinence Assessment and Management 0 []  - 0 Ostomy Care Assessment and Management (repouching, etc.) PROCESS - Coordination of Care X - Simple Patient / Family Education for ongoing care 1 15 []  - 0 Complex (extensive) Patient / Family Education for ongoing care []  - 0 Staff obtains Programmer, systems, Records, Test Results / Process Orders []  - 0 Staff telephones HHA, Nursing Homes / Clarify orders / etc []  - 0 Routine Transfer to another Facility (non-emergent condition) []  - 0 Routine Hospital Admission (non-emergent condition) []  - 0 New Admissions / Biomedical engineer / Ordering NPWT, Apligraf, etc. []  - 0 Emergency Hospital Admission (emergent condition) X- 1 10 Simple Discharge Coordination []  - 0 Complex (extensive) Discharge Coordination PROCESS - Special Needs []  - Pediatric / Minor Patient Management 0 []  - 0 Isolation Patient Management []  - 0 Hearing / Language / Visual special needs []  - 0 Assessment of Community assistance (transportation, D/C planning, etc.) []  - 0 Additional assistance / Altered mentation []  - 0 Support Surface(s) Assessment (bed, cushion, seat,  etc.) INTERVENTIONS - Wound Cleansing / Measurement Harris, Yolanda E. (Yolanda Harris) X- 1 5 Simple Wound Cleansing - one wound []  - 0 Complex Wound Cleansing - multiple wounds X- 1 5 Wound Imaging (photographs -  any number of wounds) []  - 0 Wound Tracing (instead of photographs) X- 1 5 Simple Wound Measurement - one wound []  - 0 Complex Wound Measurement - multiple wounds INTERVENTIONS - Wound Dressings []  - Small Wound Dressing one or multiple wounds 0 X- 1 15 Medium Wound Dressing one or multiple wounds []  - 0 Large Wound Dressing one or multiple wounds []  - 0 Application of Medications - topical []  - 0 Application of Medications - injection INTERVENTIONS - Miscellaneous []  - External ear exam 0 []  - 0 Specimen Collection (cultures, biopsies, blood, body fluids, etc.) []  - 0 Specimen(s) / Culture(s) sent or taken to Lab for analysis []  - 0 Patient Transfer (multiple staff / / Similar devices) []  - 0 Simple Staple / Suture removal (25 or less) []  - 0 Complex Staple / Suture removal (26 or more) []  - 0 Hypo / Hyperglycemic Management (close monitor of Blood Glucose) []  - 0 Ankle / Brachial Index (ABI) - do not check if billed separately X- 1 5 Vital Signs Has the patient been seen at the hospital within the last three years: Yes Total Score: 80 Level Of Care: New/Established - Level 3 Electronic Signature(s) Signed: 05/06/2020 5:04:05 PM By: , BSN, RN, CWS, Kim RN, BSN Entered By: , BSN, RN, CWS, Kim on 05/06/2020 08:35:47 ( ) -------------------------------------------------------------------------------- Encounter Discharge Information Details Patient Name: Date of Service: 05/06/2020 8:15 AM Medical Record Number: Patient Account Number: Nurse, adult Date of Birth/Sex: 11-11-1956 (63 y.o. F) Treating RN: Primary Care Kaylor Maiers: Other Clinician: Referring  Brighten Buzzelli: 05/08/2020 Treating Aubriee Szeto/Extender: Elliot Gurney in Treatment: 1 Encounter Discharge Information Items Discharge Condition: Stable Ambulatory Status: Ambulatory Discharge Destination: Home Transportation: Private Auto Accompanied By: self Schedule Follow-up Appointment: Yes Clinical Summary of Care: Electronic Signature(s) Signed: 05/06/2020 5:04:05 PM By: 05/08/2020, BSN, RN, CWS, Kim RN, BSN Entered By: Yolanda Harris, BSN, RN, CWS, Kim on 05/06/2020 08:36:40 Yolanda Harris (05/08/2020) -------------------------------------------------------------------------------- Lower Extremity Assessment Details Patient Name: Yolanda Harris Date of Service: 05/06/2020 8:15 AM Medical Record Number: 10/21/1956 Patient Account Number: [id number] Date of Birth/Sex: 1956/10/26 (63 y.o. F) Treating RN: Bluford Main Primary Care Keslie Gritz: Altamese [place] Other Clinician: Referring Elizzie Westergard: 05/08/2020 Treating Cregg Jutte/Extender: Elliot Gurney in Treatment: 1 Edema Assessment Assessed: [Left: No] [Right: No] Edema: [Left: Ye] [Right: s] Vascular Assessment Pulses: Dorsalis Pedis Palpable: [Left:Yes] Electronic Signature(s) Signed: 05/06/2020 4:35:33 PM By: 05/08/2020 Entered By: Yolanda Harris on 05/06/2020 08:25:28 Yolanda Harris (05/08/2020) -------------------------------------------------------------------------------- Multi Wound Chart Details Patient Name: Yolanda Harris Date of Service: 05/06/2020 8:15 AM Medical Record Number: 10/21/1956 Patient Account Number: [id number] Date of Birth/Sex: 02/21/1956 (63 y.o. F) Treating RN: Bluford Main Primary Care Savanna Dooley: Altamese  Other Clinician: Referring Duquan Gillooly: 05/08/2020 Treating Karlin Heilman/Extender: Curtis Sites in Treatment: 1 Vital Signs Height(in): 68 Pulse(bpm): 84 Weight(lbs): 246 Blood Pressure(mmHg): 127/74 Body Mass Index(BMI):  37 Temperature(F): 98.2 Respiratory Rate(breaths/min): 16 Photos: [N/A:N/A] Wound Location: Left, Plantar Metatarsal head first N/A N/A Wounding Event: Blister N/A N/A Primary Etiology: Diabetic Wound/Ulcer of the Lower N/A N/A Extremity Comorbid History: Type II Diabetes, Neuropathy N/A N/A Date Acquired: 04/19/2020 N/A N/A Weeks of Treatment: 1 N/A N/A Wound Status: Open N/A N/A Measurements L x W x D (cm) 0.6x0.9x0.1 N/A N/A Area (cm) : 0.424 N/A N/A Volume (cm) : 0.042 N/A N/A % Reduction in Area: 58.50% N/A N/A % Reduction in Volume: 58.80% N/A N/A Classification:  Grade 2 N/A N/A Exudate Amount: Medium N/A N/A Exudate Type: Serous N/A N/A Exudate Color: amber N/A N/A Wound Margin: Flat and Intact N/A N/A Granulation Amount: Large (67-100%) N/A N/A Granulation Quality: Pink N/A N/A Necrotic Amount: None Present (0%) N/A N/A Exposed Structures: Fat Layer (Subcutaneous Tissue) N/A N/A Exposed: Yes Fascia: No Tendon: No Muscle: No Joint: No Bone: No Epithelialization: None N/A N/A Treatment Notes Wound #1 (Left, Plantar Metatarsal head first) Notes scell, felt, conform, peg assist Electronic Signature(s) ALAISHA, EVERSLEY (Yolanda Harris) Signed: 05/06/2020 4:26:25 PM By: Baltazar Najjar MD Entered By: Baltazar Najjar on 05/06/2020 08:38:02 Yolanda Harris ([id number]) -------------------------------------------------------------------------------- Multi-Disciplinary Care Plan Details Patient Name: Yolanda Harris Date of Service: 05/06/2020 8:15 AM Medical Record Number: [id number] Patient Account Number: [id number] Date of Birth/Sex: [birth year] (63 y.o. F) Treating RN: Huel Coventry Primary Care Keidy Thurgood: Bluford Main Other Clinician: Referring Angelique Chevalier: Bluford Main Treating Massiah Longanecker/Extender: Altamese [place] in Treatment: 1 Active Inactive Orientation to the Wound Care Program Nursing Diagnoses: Knowledge deficit related to the wound  healing center program Goals: Patient/caregiver will verbalize understanding of the Wound Healing Center Program Date Initiated: 04/29/2020 Target Resolution Date: 05/29/2020 Goal Status: Active Interventions: Provide education on orientation to the wound center Notes: Pressure Nursing Diagnoses: Knowledge deficit related to management of pressures ulcers Goals: Patient/caregiver will verbalize risk factors for pressure ulcer development Date Initiated: 04/29/2020 Target Resolution Date: 05/29/2020 Goal Status: Active Interventions: Assess potential for pressure ulcer upon admission and as needed Notes: Wound/Skin Impairment Nursing Diagnoses: Impaired tissue integrity Goals: Ulcer/skin breakdown will have a volume reduction of 30% by week 4 Date Initiated: 04/29/2020 Target Resolution Date: 05/29/2020 Goal Status: Active Interventions: Assess ulceration(s) every visit Notes: Electronic Signature(s) Signed: 05/06/2020 5:04:05 PM By: Elliot Gurney, BSN, RN, CWS, Kim RN, BSN Entered By: Elliot Gurney, BSN, RN, CWS, Kim on 05/06/2020 08:34:45 Yolanda Harris ([id number]) -------------------------------------------------------------------------------- Pain Assessment Details Patient Name: Yolanda Harris Date of Service: 05/06/2020 8:15 AM Medical Record Number: [id number] Patient Account Number: [id number] Date of Birth/Sex: 07-09-1956 (63 y.o. F) Treating RN: Curtis Sites Primary Care Leviathan Macera: Bluford Main Other Clinician: Referring Jahlia Omura: Bluford Main Treating Jeb Schloemer/Extender: Altamese [place] in Treatment: 1 Active Problems Location of Pain Severity and Description of Pain Patient Has Paino No Site Locations Pain Management and Medication Current Pain Management: Electronic Signature(s) Signed: 05/06/2020 4:35:33 PM By: Curtis Sites Entered By: Curtis Sites on 05/06/2020 08:20:59 Yolanda Harris  ([id number]) -------------------------------------------------------------------------------- Patient/Caregiver Education Details Patient Name: Yolanda Harris Date of Service: 05/06/2020 8:15 AM Medical Record Number: [id number] Patient Account Number: [id number] Date of Birth/Gender: 02/23/1956 (63 y.o. F) Treating RN: Huel Coventry Primary Care Physician: Bluford Main Other Clinician: Referring Physician: Bluford Main Treating Physician/Extender: Altamese [place] in Treatment: 1 Education Assessment Education Provided To: Patient Education Topics Provided Wound/Skin Impairment: Handouts: Caring for Your Ulcer Methods: Demonstration, Explain/Verbal Responses: State content correctly Electronic Signature(s) Signed: 05/06/2020 5:04:05 PM By: Elliot Gurney, BSN, RN, CWS, Kim RN, BSN Entered By: Elliot Gurney, BSN, RN, CWS, Kim on 05/06/2020 08:36:07 Yolanda Harris ([id number]) -------------------------------------------------------------------------------- Wound Assessment Details Patient Name: Yolanda Harris Date of Service: 05/06/2020 8:15 AM Medical Record Number: [id number] Patient Account Number: [id number] Date of Birth/Sex: January 25, 1956 (63 y.o. F) Treating RN: Curtis Sites Primary Care Braydon Kullman: Bluford Main Other Clinician: Referring Spenser Cong: Bluford Main Treating Lavora Brisbon/Extender: Altamese [place] in Treatment: 1 Wound Status Wound Number: 1 Primary Etiology: Diabetic Wound/Ulcer of the Lower Extremity Wound Location: Left, Plantar Metatarsal head first Wound Status:  Open Wounding Event: Blister Comorbid History: Type II Diabetes, Neuropathy Date Acquired: 04/19/2020 Weeks Of Treatment: 1 Clustered Wound: No Photos Wound Measurements Length: (cm) 0.6 Width: (cm) 0.9 Depth: (cm) 0.1 Area: (cm) 0.424 Volume: (cm) 0.042 % Reduction in Area: 58.5% % Reduction in Volume: 58.8% Epithelialization: None Tunneling: No Undermining:  No Wound Description Classification: Grade 2 Wound Margin: Flat and Intact Exudate Amount: Medium Exudate Type: Serous Exudate Color: amber Foul Odor After Cleansing: No Slough/Fibrino No Wound Bed Granulation Amount: Large (67-100%) Exposed Structure Granulation Quality: Pink Fascia Exposed: No Necrotic Amount: None Present (0%) Fat Layer (Subcutaneous Tissue) Exposed: Yes Tendon Exposed: No Muscle Exposed: No Joint Exposed: No Bone Exposed: No Treatment Notes Wound #1 (Left, Plantar Metatarsal head first) Notes scell, felt, conform, peg assist Electronic Signature(s) Signed: 05/06/2020 4:35:33 PM By: Margarita Mail, Thornell Mule ([id number]) Entered By: Curtis Sites on 05/06/2020 08:26:18 Yolanda Harris ([id number]) -------------------------------------------------------------------------------- Vitals Details Patient Name: Yolanda Harris Date of Service: 05/06/2020 8:15 AM Medical Record Number: [id number] Patient Account Number: [id number] Date of Birth/Sex: Aug 16, 1956 (63 y.o. F) Treating RN: Curtis Sites Primary Care Derrell Milanes: Bluford Main Other Clinician: Referring Leighann Amadon: Bluford Main Treating Helios Kohlmann/Extender: Altamese [place] in Treatment: 1 Vital Signs Time Taken: 08:21 Temperature (F): 98.2 Height (in): 68 Pulse (bpm): 84 Weight (lbs): 246 Respiratory Rate (breaths/min): 16 Body Mass Index (BMI): 37.4 Blood Pressure (mmHg): 127/74 Reference Range: 80 - 120 mg / dl Electronic Signature(s) Signed: 05/06/2020 4:35:33 PM By: Curtis Sites Entered By: Curtis Sites on 05/06/2020 25:05:39

## 2020-05-06 NOTE — Progress Notes (Signed)
Yolanda Harris, Yolanda Harris ([id number]) Visit Report for 05/06/2020 HPI Details Patient Name: Yolanda Harris, Yolanda Harris Date of Service: 05/06/2020 8:15 AM Medical Record Number: [id number] Patient Account Number: [id number] Date of Birth/Sex: [birth year] (63 y.o. F) Treating RN: Huel Coventry Primary Care Provider: Bluford Main Other Clinician: Referring Provider: Bluford Main Treating Provider/Extender: Altamese [place] in Treatment: 1 History of Present Illness HPI Description: ADMISSION 04/29/2020 . This is a 64 year old woman who was vacationing at [facility]. She got home and then the next day noted drainage from her foot she discovered she had an ulcer on her left first metatarsal head. 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 As far as we know her type 2 diabetes is otherwise been under good control. She has been cleaning this and applying a Band-Aid. Past medical history includes type 2 diabetes with neuropathy, history of venous insufficiency, she is a smoker ABIs in our clinic were 1.65 on the right and 1.59 on the left 6/16; patient has had a nice improvement in the wound on her left plantar foot. Surface area is much better Electronic Signature(s) Signed: 05/06/2020 4:26:25 PM By: Baltazar Najjar MD Entered By: Baltazar Najjar on 05/06/2020 08:38:47 Yolanda Harris ([id number]) -------------------------------------------------------------------------------- Physical Exam Details Patient Name: Yolanda Harris Date of Service: 05/06/2020 8:15 AM Medical Record Number: [id number] Patient Account Number: [id number] Date of Birth/Sex: 06/21/1956 (63 y.o. F) Treating RN: Huel Coventry Primary Care Provider: Bluford Main Other Clinician: Referring Provider: Bluford Main Treating Provider/Extender: Altamese [place] in Treatment: 1 Constitutional Sitting or standing Blood Pressure is within target range for  patient.. Pulse regular and within target range for patient.Marland Kitchen Respirations regular, non- labored and within target range.. Temperature is normal and within the target range for the patient.Marland Kitchen appears in no distress. Cardiovascular Pedal pulses of both the dorsalis pedis and posterior tibial are easily palpable. Her forefoot is warm. Integumentary (Hair, Skin) No erythema or evidence of infection around the wound. Notes Wound exam; the area in question over the plantar left first metatarsal head. Really a full-thickness wound last week it is now filled in nicely. There is no depth. Surface area is also down. There is no evidence of surrounding infection Electronic Signature(s) Signed: 05/06/2020 4:26:25 PM By: Baltazar Najjar MD Entered By: Baltazar Najjar on 05/06/2020 08:40:07 Yolanda Harris ([id number]) -------------------------------------------------------------------------------- Physician Orders Details Patient Name: Yolanda Harris Date of Service: 05/06/2020 8:15 AM Medical Record Number: [id number] Patient Account Number: [id number] Date of Birth/Sex: [birth year] (63 y.o. F) Treating RN: Huel Coventry Primary Care Provider: Bluford Main Other Clinician: Referring Provider: Bluford Main Treating Provider/Extender: Altamese [place] in Treatment: 1 Verbal / Phone Orders: No Diagnosis Coding Wound Cleansing Wound #1 Left,Plantar Metatarsal head first o Clean wound with Normal Saline. - in office o Cleanse wound with mild soap and water Primary Wound Dressing Wound #1 Left,Plantar Metatarsal head first o Silver Alginate Secondary Dressing Wound #1 Left,Plantar Metatarsal head first o Conform/Kerlix o Other - felt Dressing Change Frequency Wound #1 Left,Plantar Metatarsal head first o Change dressing every day. Follow-up Appointments Wound #1 Left,Plantar Metatarsal head first o Return Appointment in 1 week. Off-Loading Wound #1  Left,Plantar Metatarsal head first o Open toe surgical shoe with peg assist. Electronic Signature(s) Signed: 05/06/2020 4:26:25 PM By: Baltazar Najjar MD Signed: 05/06/2020 5:04:05 PM By: Elliot Gurney, BSN, RN, CWS, Kim RN, BSN Entered By: Elliot Gurney, BSN, RN, CWS, Kim on 05/06/2020 08:35:15 Yolanda Harris, Yolanda Harris ([id number]) --------------------------------------------------------------------------------  Problem List Details Patient Name: Yolanda Harris, Yolanda Harris Date of Service: 05/06/2020 8:15 AM Medical Record Number: [id number] Patient Account Number: [id number] Date of Birth/Sex: [birth year] (64 y.o. 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 F) Treating RN: Cornell Barman Primary Care Provider: Pernell Dupre Other Clinician: Referring Provider: Pernell Dupre Treating Provider/Extender: Tito Dine in Treatment: 1 Subjective History of Present Illness (HPI) ADMISSION 04/29/2020 . This is a 64 year old woman who was vacationing at [facility]. She got  home and then the next day noted drainage from her foot she discovered she had an ulcer on her left first metatarsal head. 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 As far as we know her type 2 diabetes is otherwise been under good control. She has been cleaning this and applying a Band-Aid. Past medical history includes type 2 diabetes with neuropathy, history of venous insufficiency, she is a smoker ABIs in our clinic were 1.65 on the right and 1.59 on the left 6/16; patient has had a nice improvement in the wound on her left plantar foot. Surface area is much better Objective Constitutional Sitting or standing Blood Pressure is within target range for patient.. Pulse regular and within target range for patient.Marland Kitchen Respirations regular, non- labored and within target range.. Temperature is normal and within the target range for the patient.Marland Kitchen appears in no distress. Vitals Time Taken: 8:21 AM, Height: 68 in, Weight: 246 lbs, BMI: 37.4, Temperature: 98.2 F, Pulse: 84 bpm, Respiratory Rate: 16 breaths/min, Blood Pressure: 127/74 mmHg. Cardiovascular Pedal pulses of both the dorsalis pedis and posterior tibial are easily palpable. Her forefoot is warm. General Notes: Wound exam; the area in question over the plantar left first metatarsal head. Really a full-thickness wound last week it is now filled in nicely. There is no depth. Surface area is also down. There is no evidence of surrounding infection Integumentary (Hair, Skin) No erythema or evidence of infection around the wound. Wound #1 status is Open. Original cause of wound was Blister. The wound is located on the Left,Plantar Metatarsal head first. The wound measures 0.6cm length x 0.9cm width x 0.1cm depth; 0.424cm^2 area and 0.042cm^3 volume. There is Fat Layer (Subcutaneous Tissue) Exposed exposed. There is no tunneling or undermining noted. There is a medium amount of serous drainage  noted. The wound margin is flat and intact. There is large (67-100%) pink granulation within the wound bed. There is no necrotic tissue within the wound bed. Assessment Active Problems ICD-10 Type 2 diabetes mellitus with foot ulcer Non-pressure chronic ulcer of other part of left foot with fat layer exposed Type 2 diabetes mellitus with diabetic polyneuropathy Hutchins, Zamariya E. ([id number]) Plan Wound Cleansing: Wound #1 Left,Plantar Metatarsal head first: Clean wound with Normal Saline. -  in office Cleanse wound with mild soap and water Primary Wound Dressing: Wound #1 Left,Plantar Metatarsal head first: Silver Alginate Secondary Dressing: Wound #1 Left,Plantar Metatarsal head first: Conform/Kerlix Other - felt Dressing Change Frequency: Wound #1 Left,Plantar Metatarsal head first: Change dressing every day. Follow-up Appointments: Wound #1 Left,Plantar Metatarsal head first: Return Appointment in 1 week. Off-Loading: Wound #1 Left,Plantar Metatarsal head first: Open toe surgical shoe with peg assist. #1 continue with the same treatment which was silver alginate/Kerlix/conform 2. The patient's ABIs were noncompressible in our clinic however her pedal pulses are robust. Her forefoot is warm I do not believe there is currently an arterial issue sufficient enough to affect wound healing in this area therefore I am not going to order formal arterial studies at this point 3. The patient has a significant peripheral neuropathy. I have asked her to begin to carefully inspect her feet including between her toes and on the plantar aspect. Also use of footwear with forefoot space, cushioning insoles etc. Electronic Signature(s) Signed: 05/06/2020 4:26:25 PM By: Baltazar Najjar MD Entered By: Baltazar Najjar on 05/06/2020 08:42:35 Yolanda Harris, Yolanda Harris ([id number]) -------------------------------------------------------------------------------- SuperBill Details Patient Name: Yolanda Harris Date of Service: 05/06/2020 Medical Record Number: [id number] Patient Account Number: [id number] Date of Birth/Sex: December 24, 1955 (63 y.o. F) Treating RN: Huel Coventry Primary Care Provider: Bluford Main Other Clinician: Referring Provider: Bluford Main Treating Provider/Extender: Altamese [place] in Treatment: 1 Diagnosis Coding ICD-10 Codes Code Description E11.621 Type 2 diabetes mellitus with foot ulcer L97.522 Non-pressure chronic ulcer of other part of left foot with fat layer exposed E11.42 Type 2 diabetes mellitus with diabetic polyneuropathy Facility Procedures CPT4 Code: [id number] Description: 99213 - WOUND CARE VISIT-LEV 3 EST PT Modifier: Quantity: 1 Physician Procedures CPT4 Code: [id number] Description: 99213 - WC PHYS LEVEL 3 - EST PT Modifier: Quantity: 1 CPT4 Code: Description: ICD-10 Diagnosis Description E11.621 Type 2 diabetes mellitus with foot ulcer L97.522 Non-pressure chronic ulcer of other part of left foot with fat layer ex E11.42 Type 2 diabetes mellitus with diabetic polyneuropathy Modifier: posed Quantity: Electronic Signature(s) Signed: 05/06/2020 4:26:25 PM By: Baltazar Najjar MD Entered By: Baltazar Najjar on 05/06/2020 08:42:56

## 2020-05-13 ENCOUNTER — Encounter: Payer: Medicare HMO | Admitting: Internal Medicine

## 2020-05-13 ENCOUNTER — Other Ambulatory Visit: Payer: Self-pay

## 2020-05-13 DIAGNOSIS — E11621 Type 2 diabetes mellitus with foot ulcer: Secondary | ICD-10-CM | POA: Diagnosis not present

## 2020-05-13 NOTE — Progress Notes (Signed)
ALEXAH, KIVETT ([id number]) Visit Report for 05/13/2020 Arrival Information Details Patient Name: Yolanda Harris, Yolanda Harris Date of Service: 05/13/2020 1:30 PM Medical Record Number: [id number] Patient Account Number: [id number] Date of Birth/Sex: [birth year] (63 y.o. F) Treating RN: Huel Coventry Primary Care Madysin Crisp: Bluford Main Other Clinician: Referring Lanita Stammen: Bluford Main Treating Joseantonio Dittmar/Extender: Altamese [place] in Treatment: 2 Visit Information History Since Last Visit Added or deleted any medications: No Patient Arrived: Ambulatory Any new allergies or adverse reactions: No Arrival Time: 13:25 Had a fall or experienced change in No Accompanied By: self activities of daily living that may affect Transfer Assistance: None risk of falls: Patient Identification Verified: Yes Signs or symptoms of abuse/neglect since last visito No Secondary Verification Process Completed: Yes Hospitalized since last visit: No Implantable device outside of the clinic excluding No cellular tissue based products placed in the center since last visit: Has Dressing in Place as Prescribed: Yes Pain Present Now: No Electronic Signature(s) Signed: 05/13/2020 3:28:46 PM By: Dayton Martes RCP, RRT, CHT Entered By: Dayton Martes on 05/13/2020 13:26:44 Yolanda Harris ([id number]) -------------------------------------------------------------------------------- Clinic Level of Care Assessment Details Patient Name: Yolanda Harris Date of Service: 05/13/2020 1:30 PM Medical Record Number: [id number] Patient Account Number: [id number] Date of Birth/Sex: [birth year] (63 y.o. F) Treating RN: Huel Coventry Primary Care Darsha Zumstein: Bluford Main Other Clinician: Referring Ronne Stefanski: Bluford Main Treating Makynlie Rossini/Extender: Altamese [place] in Treatment: 2 Clinic Level of Care Assessment Items TOOL 4 Quantity Score []  - Use when only an EandM  is performed on FOLLOW-UP visit 0 ASSESSMENTS - Nursing Assessment / Reassessment X - Reassessment of Co-morbidities (includes updates in patient status) 1 10 X- 1 5 Reassessment of Adherence to Treatment Plan ASSESSMENTS - Wound and Skin Assessment / Reassessment X - Simple Wound Assessment / Reassessment - one wound 1 5 []  - 0 Complex Wound Assessment / Reassessment - multiple wounds []  - 0 Dermatologic / Skin Assessment (not related to wound area) ASSESSMENTS - Focused Assessment []  - Circumferential Edema Measurements - multi extremities 0 []  - 0 Nutritional Assessment / Counseling / Intervention []  - 0 Lower Extremity Assessment (monofilament, tuning fork, pulses) []  - 0 Peripheral Arterial Disease Assessment (using hand held doppler) ASSESSMENTS - Ostomy and/or Continence Assessment and Care []  - Incontinence Assessment and Management 0 []  - 0 Ostomy Care Assessment and Management (repouching, etc.) PROCESS - Coordination of Care X - Simple Patient / Family Education for ongoing care 1 15 []  - 0 Complex (extensive) Patient / Family Education for ongoing care []  - 0 Staff obtains , Records, Test Results / Process Orders []  - 0 Staff telephones HHA, Nursing Homes / Clarify orders / etc []  - 0 Routine Transfer to another Facility (non-emergent condition) []  - 0 Routine Hospital Admission (non-emergent condition) []  - 0 New Admissions / / Ordering NPWT, Apligraf, etc. []  - 0 Emergency Hospital Admission (emergent condition) X- 1 10 Simple Discharge Coordination []  - 0 Complex (extensive) Discharge Coordination PROCESS - Special Needs []  - Pediatric / Minor Patient Management 0 []  - 0 Isolation Patient Management []  - 0 Hearing / Language / Visual special needs []  - 0 Assessment of Community assistance (transportation, D/C planning, etc.) []  - 0 Additional assistance / Altered mentation []  - 0 Support Surface(s) Assessment (bed,  cushion, seat, etc.) INTERVENTIONS - Wound Cleansing / Measurement Jilek, Breyon E. ( ) X- 1 5 Simple Wound Cleansing - one wound []  - 0 Complex Wound Cleansing - multiple wounds X-  1 5 Wound Imaging (photographs - any number of wounds) []  - 0 Wound Tracing (instead of photographs) X- 1 5 Simple Wound Measurement - one wound []  - 0 Complex Wound Measurement - multiple wounds INTERVENTIONS - Wound Dressings []  - Small Wound Dressing one or multiple wounds 0 X- 1 15 Medium Wound Dressing one or multiple wounds []  - 0 Large Wound Dressing one or multiple wounds []  - 0 Application of Medications - topical []  - 0 Application of Medications - injection INTERVENTIONS - Miscellaneous []  - External ear exam 0 []  - 0 Specimen Collection (cultures, biopsies, blood, body fluids, etc.) []  - 0 Specimen(s) / Culture(s) sent or taken to Lab for analysis []  - 0 Patient Transfer (multiple staff / Civil Service fast streamer / Similar devices) []  - 0 Simple Staple / Suture removal (25 or less) []  - 0 Complex Staple / Suture removal (26 or more) []  - 0 Hypo / Hyperglycemic Management (close monitor of Blood Glucose) []  - 0 Ankle / Brachial Index (ABI) - do not check if billed separately X- 1 5 Vital Signs Has the patient been seen at the hospital within the last three years: Yes Total Score: 80 Level Of Care: New/Established - Level 3 Electronic Signature(s) Signed: 05/13/2020 3:55:21 PM By: Gretta Cool, BSN, RN, CWS, Kim RN, BSN Entered By: Gretta Cool, BSN, RN, CWS, Kim on 05/13/2020 13:46:33 Yolanda Harris ([id number]) -------------------------------------------------------------------------------- Encounter Discharge Information Details Patient Name: Yolanda Harris Date of Service: 05/13/2020 1:30 PM Medical Record Number: [id number] Patient Account Number: [id number] Date of Birth/Sex: [birth year] (63 y.o. F) Treating RN: Cornell Barman Primary Care Lamel Mccarley: Pernell Dupre Other  Clinician: Referring Selma Mink: Pernell Dupre Treating Rjay Revolorio/Extender: Tito Dine in Treatment: 2 Encounter Discharge Information Items Discharge Condition: Stable Ambulatory Status: Ambulatory Discharge Destination: Home Transportation: Private Auto Accompanied By: self Schedule Follow-up Appointment: Yes Clinical Summary of Care: Electronic Signature(s) Signed: 05/13/2020 3:55:21 PM By: Gretta Cool, BSN, RN, CWS, Kim RN, BSN Entered By: Gretta Cool, BSN, RN, CWS, Kim on 05/13/2020 13:47:22 LYNDSEE, CASA ([id number]) -------------------------------------------------------------------------------- Lower Extremity Assessment Details Patient Name: Yolanda Harris Date of Service: 05/13/2020 1:30 PM Medical Record Number: [id number] Patient Account Number: [id number] Date of Birth/Sex: [birth year] (63 y.o. F) Treating RN: Montey Hora Primary Care Velina Drollinger: Pernell Dupre Other Clinician: Referring Jazlyne Gauger: Pernell Dupre Treating Vasili Fok/Extender: Tito Dine in Treatment: 2 Edema Assessment Assessed: [Left: No] [Right: No] Edema: [Left: Ye] [Right: s] Vascular Assessment Pulses: Dorsalis Pedis Palpable: [Left:Yes] Electronic Signature(s) Signed: 05/13/2020 3:45:40 PM By: Montey Hora Entered By: Montey Hora on 05/13/2020 13:38:29 Morten, Estanislado Pandy ([id number]) -------------------------------------------------------------------------------- Multi Wound Chart Details Patient Name: Yolanda Harris Date of Service: 05/13/2020 1:30 PM Medical Record Number: [id number] Patient Account Number: [id number] Date of Birth/Sex: 1956/03/05 (63 y.o. F) Treating RN: Cornell Barman Primary Care Pearl Berlinger: Pernell Dupre Other Clinician: Referring Camaron Cammack: Pernell Dupre Treating Griff Badley/Extender: Tito Dine in Treatment: 2 Vital Signs Height(in): 68 Pulse(bpm): 78 Weight(lbs): 246 Blood Pressure(mmHg): 146/72 Body Mass  Index(BMI): 37 Temperature(F): 98.1 Respiratory Rate(breaths/min): 18 Photos: [N/A:N/A] Wound Location: Left, Plantar Metatarsal head first N/A N/A Wounding Event: Blister N/A N/A Primary Etiology: Diabetic Wound/Ulcer of the Lower N/A N/A Extremity Comorbid History: Type II Diabetes, Neuropathy N/A N/A Date Acquired: 04/19/2020 N/A N/A Weeks of Treatment: 2 N/A N/A Wound Status: Open N/A N/A Measurements L x W x D (cm) 0.3x0.6x0.1 N/A N/A Area (cm) : 0.141 N/A N/A Volume (cm) : 0.014 N/A N/A % Reduction in Area: 86.20% N/A N/A % Reduction  in Volume: 86.30% N/A N/A Classification: Grade 2 N/A N/A Exudate Amount: Medium N/A N/A Exudate Type: Serous N/A N/A Exudate Color: amber N/A N/A Wound Margin: Flat and Intact N/A N/A Granulation Amount: Large (67-100%) N/A N/A Granulation Quality: Pink N/A N/A Necrotic Amount: Small (1-33%) N/A N/A Exposed Structures: Fat Layer (Subcutaneous Tissue) N/A N/A Exposed: Yes Fascia: No Tendon: No Muscle: No Joint: No Bone: No Epithelialization: None N/A N/A Treatment Notes Wound #1 (Left, Plantar Metatarsal head first) Notes scell, felt, conform, peg assist Electronic Signature(s) AKAIYA, TOUCHETTE ([id number]) Signed: 05/13/2020 3:45:53 PM By: Baltazar Najjar MD Entered By: Baltazar Najjar on 05/13/2020 13:49:17 Yolanda Harris ([id number]) -------------------------------------------------------------------------------- Multi-Disciplinary Care Plan Details Patient Name: Yolanda Harris Date of Service: 05/13/2020 1:30 PM Medical Record Number: [id number] Patient Account Number: [id number] Date of Birth/Sex: 1956/05/17 (63 y.o. F) Treating RN: Huel Coventry Primary Care Stone Spirito: Bluford Main Other Clinician: Referring Randee Upchurch: Bluford Main Treating Dail Lerew/Extender: Altamese [place] in Treatment: 2 Active Inactive Orientation to the Wound Care Program Nursing Diagnoses: Knowledge deficit related to the  wound healing center program Goals: Patient/caregiver will verbalize understanding of the Wound Healing Center Program Date Initiated: 04/29/2020 Target Resolution Date: 05/29/2020 Goal Status: Active Interventions: Provide education on orientation to the wound center Notes: Pressure Nursing Diagnoses: Knowledge deficit related to management of pressures ulcers Goals: Patient/caregiver will verbalize risk factors for pressure ulcer development Date Initiated: 04/29/2020 Target Resolution Date: 05/29/2020 Goal Status: Active Interventions: Assess potential for pressure ulcer upon admission and as needed Notes: Wound/Skin Impairment Nursing Diagnoses: Impaired tissue integrity Goals: Ulcer/skin breakdown will have a volume reduction of 30% by week 4 Date Initiated: 04/29/2020 Target Resolution Date: 05/29/2020 Goal Status: Active Interventions: Assess ulceration(s) every visit Notes: Electronic Signature(s) Signed: 05/13/2020 3:55:21 PM By: Elliot Gurney, BSN, RN, CWS, Kim RN, BSN Entered By: Elliot Gurney, BSN, RN, CWS, Kim on 05/13/2020 13:45:23 ZYKERIA, LAGUARDIA ([id number]) -------------------------------------------------------------------------------- Pain Assessment Details Patient Name: Yolanda Harris Date of Service: 05/13/2020 1:30 PM Medical Record Number: [id number] Patient Account Number: [id number] Date of Birth/Sex: 05/06/1956 (63 y.o. F) Treating RN: Curtis Sites Primary Care Chaddrick Brue: Bluford Main Other Clinician: Referring Lyndell Allaire: Bluford Main Treating Kamaal Cast/Extender: Altamese [place] in Treatment: 2 Active Problems Location of Pain Severity and Description of Pain Patient Has Paino No Site Locations Pain Management and Medication Current Pain Management: Electronic Signature(s) Signed: 05/13/2020 3:45:40 PM By: Curtis Sites Entered By: Curtis Sites on 05/13/2020 13:31:31 Yolanda Harris  ([id number]) -------------------------------------------------------------------------------- Patient/Caregiver Education Details Patient Name: Yolanda Harris Date of Service: 05/13/2020 1:30 PM Medical Record Number: [id number] Patient Account Number: [id number] Date of Birth/Gender: October 02, 1956 (63 y.o. F) Treating RN: Huel Coventry Primary Care Physician: Bluford Main Other Clinician: Referring Physician: Bluford Main Treating Physician/Extender: Altamese [place] in Treatment: 2 Education Assessment Education Provided To: Patient Education Topics Provided Wound/Skin Impairment: Handouts: Caring for Your Ulcer Methods: Demonstration, Explain/Verbal Responses: State content correctly Electronic Signature(s) Signed: 05/13/2020 3:55:21 PM By: Elliot Gurney, BSN, RN, CWS, Kim RN, BSN Entered By: Elliot Gurney, BSN, RN, CWS, Kim on 05/13/2020 13:46:51 MECCA, GUITRON ([id number]) -------------------------------------------------------------------------------- Wound Assessment Details Patient Name: Yolanda Harris Date of Service: 05/13/2020 1:30 PM Medical Record Number: [id number] Patient Account Number: [id number] Date of Birth/Sex: 03-15-1956 (63 y.o. F) Treating RN: Curtis Sites Primary Care Agostino Gorin: Bluford Main Other Clinician: Referring Casy Tavano: Bluford Main Treating Lenisha Lacap/Extender: Altamese [place] in Treatment: 2 Wound Status Wound Number: 1 Primary Etiology: Diabetic Wound/Ulcer of the Lower Extremity Wound Location: Left, Plantar  Metatarsal head first Wound Status: Open Wounding Event: Blister Comorbid History: Type II Diabetes, Neuropathy Date Acquired: 04/19/2020 Weeks Of Treatment: 2 Clustered Wound: No Photos Wound Measurements Length: (cm) 0.3 Width: (cm) 0.6 Depth: (cm) 0.1 Area: (cm) 0.141 Volume: (cm) 0.014 % Reduction in Area: 86.2% % Reduction in Volume: 86.3% Epithelialization: None Tunneling: No Undermining:  No Wound Description Classification: Grade 2 Wound Margin: Flat and Intact Exudate Amount: Medium Exudate Type: Serous Exudate Color: amber Foul Odor After Cleansing: No Slough/Fibrino No Wound Bed Granulation Amount: Large (67-100%) Exposed Structure Granulation Quality: Pink Fascia Exposed: No Necrotic Amount: Small (1-33%) Fat Layer (Subcutaneous Tissue) Exposed: Yes Necrotic Quality: Adherent Slough Tendon Exposed: No Muscle Exposed: No Joint Exposed: No Bone Exposed: No Treatment Notes Wound #1 (Left, Plantar Metatarsal head first) Notes scell, felt, conform, peg assist Electronic Signature(s) Signed: 05/13/2020 3:45:40 PM By: Margarita Mail, Thornell Mule ([id number]) Entered By: Curtis Sites on 05/13/2020 13:37:30 Yolanda Harris ([id number]) -------------------------------------------------------------------------------- Vitals Details Patient Name: Yolanda Harris Date of Service: 05/13/2020 1:30 PM Medical Record Number: [id number] Patient Account Number: [id number] Date of Birth/Sex: 1956-03-01 (63 y.o. F) Treating RN: Huel Coventry Primary Care Paytan Recine: Bluford Main Other Clinician: Referring Nollie Terlizzi: Bluford Main Treating Zakk Borgen/Extender: Altamese [place] in Treatment: 2 Vital Signs Time Taken: 13:25 Temperature (F): 98.1 Height (in): 68 Pulse (bpm): 78 Weight (lbs): 246 Respiratory Rate (breaths/min): 18 Body Mass Index (BMI): 37.4 Blood Pressure (mmHg): 146/72 Reference Range: 80 - 120 mg / dl Electronic Signature(s) Signed: 05/13/2020 3:28:46 PM By: Dayton Martes RCP, RRT, CHT Entered By: Weyman Rodney, Lucio Edward on 05/13/2020 13:29:27

## 2020-05-13 NOTE — Progress Notes (Signed)
ELVIE, PALOMO ([id number]) Visit Report for 05/13/2020 HPI Details Patient Name: Yolanda Harris, Yolanda Harris Date of Service: 05/13/2020 1:30 PM Medical Record Number: [id number] Patient Account Number: [id number] Date of Birth/Sex: 26-Sep-1956 (64 y.o. F) Treating RN: Cornell Barman Primary Care Provider: Pernell Dupre Other Clinician: Referring Provider: Pernell Dupre Treating Provider/Extender: Tito Dine in Treatment: 2 History of Present Illness HPI Description: ADMISSION 04/29/2020 . This is a 64 year old woman who was vacationing at [facility]. She got home and then the next day noted drainage from her foot she discovered she had an ulcer on her left first metatarsal head. 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 As far as we know her type 2 diabetes is otherwise been under good control. She has been cleaning this and applying a Band-Aid. Past medical history includes type 2 diabetes with neuropathy, history of venous insufficiency, she is a smoker ABIs in our clinic were 1.65 on the right and 1.59 on the left 6/16; patient has had a nice improvement in the wound on her left plantar foot. Surface area is much better 6/23; the wound on the left plantar first metatarsal head continues to contract. Electronic Signature(s) Signed: 05/13/2020 3:45:53 PM By: Linton Ham MD Entered By: Linton Ham on 05/13/2020 13:49:56 Yolanda Harris ([id number]) -------------------------------------------------------------------------------- Physical Exam Details Patient Name: Yolanda Harris Date of Service: 05/13/2020 1:30 PM Medical Record Number: [id number] Patient Account Number: [id number] Date of Birth/Sex: 1955/12/26 (64 y.o. F) Treating RN: Cornell Barman Primary Care Provider: Pernell Dupre Other Clinician: Referring Provider: Pernell Dupre Treating Provider/Extender: Tito Dine in Treatment:  2 Constitutional Patient is hypertensive.. Pulse regular and within target range for patient.Marland Kitchen Respirations regular, non-labored and within target range.. Temperature is normal and within the target range for the patient.Marland Kitchen appears in no distress. Cardiovascular Pedal pulses are palpable. Integumentary (Hair, Skin) No erythema around the wound. Notes Wound exam; the area in question is on the left first plantar metatarsal head. It is filling in nicely. Down 50% from last time. No evidence of surrounding infection Electronic Signature(s) Signed: 05/13/2020 3:45:53 PM By: Linton Ham MD Entered By: Linton Ham on 05/13/2020 13:50:50 Yolanda Harris, Yolanda Harris ([id number]) -------------------------------------------------------------------------------- Physician Orders Details Patient Name: Yolanda Harris Date of Service: 05/13/2020 1:30 PM Medical Record Number: [id number] Patient Account Number: [id number] Date of Birth/Sex: 11-21-1956 (64 y.o. F) Treating RN: Cornell Barman Primary Care Provider: Pernell Dupre Other Clinician: Referring Provider: Pernell Dupre Treating Provider/Extender: Tito Dine in Treatment: 2 Verbal / Phone Orders: No Diagnosis Coding Wound Cleansing Wound #1 Left,Plantar Metatarsal head first o Clean wound with Normal Saline. - in office o Cleanse wound with mild soap and water Primary Wound Dressing Wound #1 Left,Plantar Metatarsal head first o Silver Alginate Secondary Dressing Wound #1 Left,Plantar Metatarsal head first o Conform/Kerlix o Other - felt Dressing Change Frequency Wound #1 Left,Plantar Metatarsal head first o Change dressing every day. Follow-up Appointments Wound #1 Left,Plantar Metatarsal head first o Return Appointment in 1 week. Off-Loading Wound #1 Left,Plantar Metatarsal head first o Open toe surgical shoe with peg assist. Electronic Signature(s) Signed: 05/13/2020 3:45:53 PM By: Linton Ham MD Signed: 05/13/2020 3:55:21 PM By: Gretta Cool, BSN, RN, CWS, Kim RN, BSN Entered By: Gretta Cool, BSN, RN, CWS, Kim on 05/13/2020 13:46:06 Yolanda Harris, Yolanda Harris ([id number]) -------------------------------------------------------------------------------- Problem List Details Patient Name: Yolanda Harris Date of Service: 05/13/2020 1:30 PM Medical Record Number: [id number] Patient Account Number: [id number] Date of  Birth/Sex: 01/26/1956 (64 y.o. 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 F) Treating RN: Huel Coventry Primary Care Provider: Bluford Main Other Clinician: Referring Provider: Bluford Main Treating Provider/Extender: Altamese [place] in Treatment: 2 Subjective History of Present Illness (HPI) ADMISSION 04/29/2020 . This is a 64 year old woman who was vacationing at [facility]. She got home and then the next day noted drainage from her foot she discovered she had an ulcer on her left first metatarsal head. She does not have a  history of wound wound issues however she does have diabetic polyneuropathy. As far as we know her type 2 diabetes is otherwise been under good control. She has been cleaning this and applying a Band-Aid. Past medical history includes type 2 diabetes with neuropathy, history of venous insufficiency, she is a smoker ABIs in our clinic were 1.65 on the right and 1.59 on the left 6/16; patient has had a nice improvement in the wound on her left plantar foot. Surface area is much better 6/23; the wound on the left plantar first metatarsal head continues to contract. Objective Constitutional Patient is hypertensive.. Pulse regular and within target range for patient.Marland Kitchen Respirations regular, non-labored and within target range.. Temperature is normal and within the target range for the patient.Marland Kitchen appears in no distress. Vitals Time Taken: 1:25 PM, Height: 68 in, Weight: 246 lbs, BMI: 37.4, Temperature: 98.1 F, Pulse: 78 bpm, Respiratory Rate: 18 breaths/min, Blood Pressure: 146/72 mmHg. Cardiovascular Pedal pulses are palpable. General Notes: Wound exam; the area in question is on the left first plantar metatarsal head. It is filling in nicely. Down 50% from last time. No evidence of surrounding infection Integumentary (Hair, Skin) No erythema around the wound. Wound #1 status is Open. Original cause of wound was Blister. The wound is located on the Left,Plantar Metatarsal head first. The wound measures 0.3cm length x 0.6cm width x 0.1cm depth; 0.141cm^2 area and 0.014cm^3 volume. There is Fat Layer (Subcutaneous Tissue) Exposed exposed. There is no tunneling or undermining noted. There is a medium amount of serous drainage noted. The wound margin is flat and intact. There is large (67-100%) pink granulation within the wound bed. There is a small (1-33%) amount of necrotic tissue within the wound bed including Adherent Slough. Assessment Active Problems ICD-10 Type 2 diabetes mellitus with foot  ulcer Non-pressure chronic ulcer of other part of left foot with fat layer exposed Type 2 diabetes mellitus with diabetic polyneuropathy Mizzell, Zaeda E. ([id number]) Plan Wound Cleansing: Wound #1 Left,Plantar Metatarsal head first: Clean wound with Normal Saline. - in office Cleanse wound with mild soap and water Primary Wound Dressing: Wound #1 Left,Plantar Metatarsal head first: Silver Alginate Secondary Dressing: Wound #1 Left,Plantar Metatarsal head first: Conform/Kerlix Other - felt Dressing Change Frequency: Wound #1 Left,Plantar Metatarsal head first: Change  dressing every day. Follow-up Appointments: Wound #1 Left,Plantar Metatarsal head first: Return Appointment in 1 week. Off-Loading: Wound #1 Left,Plantar Metatarsal head first: Open toe surgical shoe with peg assist. 1. Continue with silver alginate/Kerlix/conform 2. I spent some time talking to the patient today about how were going to offload this when this heals. She does not have the resources for custom-made diabetic shoes with inserts. I asked her to get a shoe with forefoot depth perhaps 2 insoles. May need to gauze over this area etc. We explained the issues of pressure and friction Electronic Signature(s) Signed: 05/13/2020 3:45:53 PM By: Baltazar Najjar MD Entered By: Baltazar Najjar on 05/13/2020 13:52:11 Yolanda Harris, Yolanda Harris ([id number]) -------------------------------------------------------------------------------- SuperBill Details Patient Name: Yolanda Harris Date of Service: 05/13/2020 Medical Record Number: [id number] Patient Account Number: [id number] Date of Birth/Sex: [birth year] (63 y.o. F) Treating RN: Huel Coventry Primary Care Provider: Bluford Main Other Clinician: Referring Provider: Bluford Main Treating Provider/Extender: Altamese [place] in Treatment: 2 Diagnosis Coding ICD-10 Codes Code Description E11.621 Type 2 diabetes mellitus with foot ulcer L97.522  Non-pressure chronic ulcer of other part of left foot with fat layer exposed E11.42 Type 2 diabetes mellitus with diabetic polyneuropathy Facility Procedures CPT4 Code: [id number] Description: 99213 - WOUND CARE VISIT-LEV 3 EST PT Modifier: Quantity: 1 Physician Procedures CPT4 Code: 5974163 Description: 99213 - WC PHYS LEVEL 3 - EST PT Modifier: Quantity: 1 CPT4 Code: Description: ICD-10 Diagnosis Description L97.522 Non-pressure chronic ulcer of other part of left foot with fat layer ex A45.364 Type 2 diabetes mellitus with foot ulcer E11.42 Type 2 diabetes mellitus with diabetic polyneuropathy Modifier: posed Quantity: Electronic Signature(s) Signed: 05/13/2020 3:45:53 PM By: Baltazar Najjar MD Entered By: Baltazar Najjar on 05/13/2020 13:52:35

## 2020-05-20 ENCOUNTER — Other Ambulatory Visit: Payer: Self-pay

## 2020-05-20 ENCOUNTER — Encounter: Payer: Medicare HMO | Admitting: Internal Medicine

## 2020-05-20 DIAGNOSIS — E11621 Type 2 diabetes mellitus with foot ulcer: Secondary | ICD-10-CM | POA: Diagnosis not present

## 2020-05-21 NOTE — Progress Notes (Signed)
Yolanda Harris, Vianney E. ([id number]) Visit Report for 05/20/2020 Arrival Information Details Patient Name: Yolanda Harris, Yolanda E. Date of Service: 05/20/2020 1:30 PM Medical Record Number: [id number] Patient Account Number: [id number] Date of Birth/Sex: Oct 27, 1956 (64 y.o. F) Treating RN: Huel CoventryWoody, Kim Primary Care Rhena Glace: Bluford Mainejan-Sie, Sheikh Other Clinician: Referring Deidre Carino: Bluford Mainejan-Sie, Sheikh Treating Melanni Benway/Extender: Altamese CarolinaOBSON, MICHAEL G Weeks in Treatment: 3 Visit Information History Since Last Visit Added or deleted any medications: Yes Patient Arrived: Ambulatory Any new allergies or adverse reactions: No Arrival Time: 13:27 Had a fall or experienced change in No Accompanied By: self activities of daily living that may affect Transfer Assistance: None risk of falls: Patient Identification Verified: Yes Signs or symptoms of abuse/neglect since last visito No Secondary Verification Process Completed: Yes Hospitalized since last visit: No Implantable device outside of the clinic excluding No cellular tissue based products placed in the center since last visit: Has Dressing in Place as Prescribed: Yes Pain Present Now: No Electronic Signature(s) Signed: 05/20/2020 2:41:56 PM By: Rodell PernaScott, Dajea Entered By: Rodell PernaScott, Dajea on 05/20/2020 14:41:56 Yolanda Harris, Yolanda E. ([id number]) -------------------------------------------------------------------------------- Clinic Level of Care Assessment Details Patient Name: Yolanda Harris, Yolanda E. Date of Service: 05/20/2020 1:30 PM Medical Record Number: [id number] Patient Account Number: [id number] Date of Birth/Sex: Oct 27, 1956 (64 y.o. F) Treating RN: Huel CoventryWoody, Kim Primary Care Desiderio Dolata: Bluford Mainejan-Sie, Sheikh Other Clinician: Referring Levorn Oleski: Bluford Mainejan-Sie, Sheikh Treating Aneisha Skyles/Extender: Altamese CarolinaOBSON, MICHAEL G Weeks in Treatment: 3 Clinic Level of Care Assessment Items TOOL 4 Quantity Score []  - Use when only an EandM is performed on FOLLOW-UP visit 0 ASSESSMENTS  - Nursing Assessment / Reassessment X - Reassessment of Co-morbidities (includes updates in patient status) 1 10 X- 1 5 Reassessment of Adherence to Treatment Plan ASSESSMENTS - Wound and Skin Assessment / Reassessment X - Simple Wound Assessment / Reassessment - one wound 1 5 []  - 0 Complex Wound Assessment / Reassessment - multiple wounds []  - 0 Dermatologic / Skin Assessment (not related to wound area) ASSESSMENTS - Focused Assessment []  - Circumferential Edema Measurements - multi extremities 0 []  - 0 Nutritional Assessment / Counseling / Intervention []  - 0 Lower Extremity Assessment (monofilament, tuning fork, pulses) []  - 0 Peripheral Arterial Disease Assessment (using hand held doppler) ASSESSMENTS - Ostomy and/or Continence Assessment and Care []  - Incontinence Assessment and Management 0 []  - 0 Ostomy Care Assessment and Management (repouching, etc.) PROCESS - Coordination of Care X - Simple Patient / Family Education for ongoing care 1 15 []  - 0 Complex (extensive) Patient / Family Education for ongoing care []  - 0 Staff obtains ChiropractorConsents, Records, Test Results / Process Orders []  - 0 Staff telephones HHA, Nursing Homes / Clarify orders / etc []  - 0 Routine Transfer to another Facility (non-emergent condition) []  - 0 Routine Hospital Admission (non-emergent condition) []  - 0 New Admissions / Manufacturing engineernsurance Authorizations / Ordering NPWT, Apligraf, etc. []  - 0 Emergency Hospital Admission (emergent condition) X- 1 10 Simple Discharge Coordination []  - 0 Complex (extensive) Discharge Coordination PROCESS - Special Needs []  - Pediatric / Minor Patient Management 0 []  - 0 Isolation Patient Management []  - 0 Hearing / Language / Visual special needs []  - 0 Assessment of Community assistance (transportation, D/C planning, etc.) []  - 0 Additional assistance / Altered mentation []  - 0 Support Surface(s) Assessment (bed, cushion, seat, etc.) INTERVENTIONS - Wound  Cleansing / Measurement Yolanda Harris, Yolanda E. ([id number]) []  - 0 Simple Wound Cleansing - one wound []  - 0 Complex Wound Cleansing - multiple wounds X- 1 5 Wound Imaging (photographs -  any number of wounds) []  - 0 Wound Tracing (instead of photographs) []  - 0 Simple Wound Measurement - one wound []  - 0 Complex Wound Measurement - multiple wounds INTERVENTIONS - Wound Dressings []  - Small Wound Dressing one or multiple wounds 0 []  - 0 Medium Wound Dressing one or multiple wounds []  - 0 Large Wound Dressing one or multiple wounds []  - 0 Application of Medications - topical []  - 0 Application of Medications - injection INTERVENTIONS - Miscellaneous []  - External ear exam 0 []  - 0 Specimen Collection (cultures, biopsies, blood, body fluids, etc.) []  - 0 Specimen(s) / Culture(s) sent or taken to Lab for analysis []  - 0 Patient Transfer (multiple staff / / Similar devices) []  - 0 Simple Staple / Suture removal (25 or less) []  - 0 Complex Staple / Suture removal (26 or more) []  - 0 Hypo / Hyperglycemic Management (close monitor of Blood Glucose) []  - 0 Ankle / Brachial Index (ABI) - do not check if billed separately X- 1 5 Vital Signs Has the patient been seen at the hospital within the last three years: Yes Total Score: 55 Level Of Care: New/Established - Level 2 Electronic Signature(s) Signed: 05/20/2020 4:01:55 PM By: Previous Signature: 05/20/2020 2:35:46 PM Version By: , BSN, RN, CWS, Kim RN, BSN Entered By: on 05/20/2020 14:43:45 ( ) -------------------------------------------------------------------------------- Encounter Discharge Information Details Patient Name: Date of Service: 05/20/2020 1:30 PM Medical Record Number: Patient Account Number: Nurse, adult Date of Birth/Sex: 26-Apr-1956 (64 y.o. F) Treating RN: Primary Care Aradhya Shellenbarger:  Other Clinician: Referring Korie Brabson: 05/22/2020 Treating Kunta Hilleary/Extender: Rodell Perna in Treatment: 3 Encounter Discharge Information Items Discharge Condition: Stable Ambulatory Status: Ambulatory Discharge Destination: Home Transportation: Private Auto Accompanied By: self Schedule Follow-up Appointment: Yes Clinical Summary of Care: Electronic Signature(s) Signed: 05/20/2020 2:44:11 PM By: Elliot Gurney Previous Signature: 05/20/2020 2:35:46 PM Version By: 05/22/2020 BSN, RN, CWS, Kim RN, BSN Entered By: Yolanda Harris on 05/20/2020 14:44:11 Yolanda Harris (05/22/2020) -------------------------------------------------------------------------------- Lower Extremity Assessment Details Patient Name: Yolanda Harris Date of Service: 05/20/2020 1:30 PM Medical Record Number: Yolanda Harris Patient Account Number: 07-22-1970 Date of Birth/Sex: 02-Jan-1956 (64 y.o. F) Treating RN: Bluford Main Primary Care Reilynn Lauro: Altamese [place] Other Clinician: Referring Aedon Deason: 05/22/2020 Treating Tessia Kassin/Extender: Rodell Perna in Treatment: 3 Vascular Assessment Pulses: Dorsalis Pedis Palpable: [Left:Yes] Electronic Signature(s) Signed: 05/20/2020 2:42:54 PM By: Elliot Gurney Signed: 05/20/2020 6:00:23 PM By: 05/22/2020 BSN, RN, CWS, Kim RN, BSN Previous Signature: 05/20/2020 2:35:46 PM Version By: [id number] BSN, RN, CWS, Kim RN, BSN Entered By: Yolanda Harris on 05/20/2020 14:42:54 Yolanda Harris, Yolanda Harris ([id number]) -------------------------------------------------------------------------------- Multi Wound Chart Details Patient Name: Yolanda Harris Date of Service: 05/20/2020 1:30 PM Medical Record Number: Huel Coventry Patient Account Number: Bluford Main Date of Birth/Sex: 1956/08/28 (64 y.o. F) Treating RN: 05/22/2020 Primary Care Makaelah Cranfield: Rodell Perna Other Clinician: Referring Patryck Kilgore: 05/22/2020 Treating Caeleigh [place]haska/Extender: Elliot Gurney  in Treatment: 3 Vital Signs Height(in): 68 Pulse(bpm): 78 Weight(lbs): 246 Blood Pressure(mmHg): 130/60 Body Mass Index(BMI): 37 Temperature(F): 98.6 Respiratory Rate(breaths/min): 18 Photos: [N/A:N/A] Wound Location: Left, Plantar Metatarsal head first N/A N/A Wounding Event: Blister N/A N/A Primary Etiology: Diabetic Wound/Ulcer of the Lower N/A N/A Extremity Comorbid History: Type II Diabetes, Neuropathy N/A N/A Date Acquired: 04/19/2020 N/A N/A Weeks of Treatment: 3 N/A N/A Wound Status: Healed - Epithelialized N/A N/A Measurements L x W x D (cm) 0x0x0 N/A  N/A Area (cm) : 0 N/A N/A Volume (cm) : 0 N/A N/A % Reduction in Area: 100.00% N/A N/A % Reduction in Volume: 100.00% N/A N/A Classification: Grade 2 N/A N/A Exudate Amount: Medium N/A N/A Exudate Type: Serous N/A N/A Exudate Color: amber N/A N/A Wound Margin: Flat and Intact N/A N/A Granulation Amount: Small (1-33%) N/A N/A Granulation Quality: Pink N/A N/A Necrotic Amount: Large (67-100%) N/A N/A Necrotic Tissue: Eschar N/A N/A Exposed Structures: Fat Layer (Subcutaneous Tissue) N/A N/A Exposed: Yes Fascia: No Tendon: No Muscle: No Joint: No Bone: No Epithelialization: Large (67-100%) N/A N/A Treatment Notes Electronic Signature(s) Signed: 05/20/2020 2:43:22 PM By: Rodell Perna Entered By: Rodell Perna on 05/20/2020 14:43:22 Yolanda Harris ([id number]) -------------------------------------------------------------------------------- Multi-Disciplinary Care Plan Details Patient Name: Yolanda Harris Date of Service: 05/20/2020 1:30 PM Medical Record Number: [id number] Patient Account Number: [id number] Date of Birth/Sex: 07-Mar-1956 (64 y.o. F) Treating RN: Huel Coventry Primary Care Guiliana Shor: Bluford Main Other Clinician: Referring Adaliz Dobis: Bluford Main Treating Ventura Hollenbeck/Extender: Altamese [place] in Treatment: 3 Active Inactive Electronic Signature(s) Signed: 05/20/2020 2:43:07 PM  By: Rodell Perna Signed: 05/20/2020 6:00:23 PM By: Elliot Gurney BSN, RN, CWS, Kim RN, BSN Previous Signature: 05/20/2020 2:35:46 PM Version By: Elliot Gurney BSN, RN, CWS, Kim RN, BSN Entered By: Rodell Perna on 05/20/2020 14:43:07 Yolanda Harris, Yolanda Harris ([id number]) -------------------------------------------------------------------------------- Pain Assessment Details Patient Name: Yolanda Harris Date of Service: 05/20/2020 1:30 PM Medical Record Number: [id number] Patient Account Number: [id number] Date of Birth/Sex: 28-Jun-1956 (64 y.o. F) Treating RN: Rodell Perna Primary Care Denora Wysocki: Bluford Main Other Clinician: Referring Waunetta Riggle: Bluford Main Treating Lorrin Nawrot/Extender: Altamese [place] in Treatment: 3 Active Problems Location of Pain Severity and Description of Pain Patient Has Paino No Site Locations Pain Management and Medication Current Pain Management: Electronic Signature(s) Signed: 05/20/2020 2:42:42 PM By: Rodell Perna Previous Signature: 05/20/2020 2:19:10 PM Version By: Rodell Perna Entered By: Rodell Perna on 05/20/2020 14:42:42 Yolanda Harris, Yolanda Harris ([id number]) -------------------------------------------------------------------------------- Wound Assessment Details Patient Name: Yolanda Harris Date of Service: 05/20/2020 1:30 PM Medical Record Number: [id number] Patient Account Number: [id number] Date of Birth/Sex: June 26, 1956 (64 y.o. F) Treating RN: Huel Coventry Primary Care Jocee Kissick: Bluford Main Other Clinician: Referring Karenna Romanoff: Bluford Main Treating Aleksia Freiman/Extender: Linwood Dibbles, HOYT Weeks in Treatment: 3 Wound Status Wound Number: 1 Primary Etiology: Diabetic Wound/Ulcer of the Lower Extremity Wound Location: Left, Plantar Metatarsal head first Wound Status: Healed - Epithelialized Wounding Event: Blister Comorbid History: Type II Diabetes, Neuropathy Date Acquired: 04/19/2020 Weeks Of Treatment: 3 Clustered Wound: No Photos Wound  Measurements Length: (cm) 0 Width: (cm) 0 Depth: (cm) 0 Area: (cm) 0 Volume: (cm) 0 % Reduction in Area: 100% % Reduction in Volume: 100% Epithelialization: Large (67-100%) Tunneling: No Undermining: No Wound Description Classification: Grade 2 Wound Margin: Flat and Intact Exudate Amount: Medium Exudate Type: Serous Exudate Color: amber Foul Odor After Cleansing: No Slough/Fibrino No Wound Bed Granulation Amount: Small (1-33%) Exposed Structure Granulation Quality: Pink Fascia Exposed: No Necrotic Amount: Large (67-100%) Fat Layer (Subcutaneous Tissue) Exposed: Yes Necrotic Quality: Eschar Tendon Exposed: No Muscle Exposed: No Joint Exposed: No Bone Exposed: No Electronic Signature(s) Signed: 05/20/2020 2:35:46 PM By: Elliot Gurney, BSN, RN, CWS, Kim RN, BSN Entered By: Elliot Gurney, BSN, RN, CWS, Kim on 05/20/2020 13:40:51 Yolanda Harris ([id number]) -------------------------------------------------------------------------------- Vitals Details Patient Name: Yolanda Harris Date of Service: 05/20/2020 1:30 PM Medical Record Number: [id number] Patient Account Number: [id number] Date of Birth/Sex: 01-19-1956 (64 y.o. F) Treating RN: Huel Coventry Primary Care Jessiah Wojnar: Bluford Main Other Clinician:  Referring Kenslei Hearty: Bluford Main Treating Atarah Cadogan/Extender: Altamese [place] in Treatment: 3 Vital Signs Time Taken: 13:25 Temperature (F): 98.6 Height (in): 68 Pulse (bpm): 78 Weight (lbs): 246 Respiratory Rate (breaths/min): 18 Body Mass Index (BMI): 37.4 Blood Pressure (mmHg): 130/60 Reference Range: 80 - 120 mg / dl Electronic Signature(s) Signed: 05/20/2020 2:42:31 PM By: Rodell Perna Entered By: Rodell Perna on 05/20/2020 14:42:30

## 2020-05-26 NOTE — Progress Notes (Signed)
Yolanda Harris, Yolanda Harris ([id number]) Visit Report for 05/20/2020 HPI Details Patient Name: Yolanda Harris, Yolanda Harris Date of Service: 05/20/2020 1:30 PM Medical Record Number: [id number] Patient Account Number: [id number] Date of Birth/Sex: November 05, 1956 (64 y.o. F) Treating RN: Huel Coventry Primary Care Provider: Bluford Main Other Clinician: Referring Provider: Bluford Main Treating Provider/Extender: Linwood Dibbles, HOYT Weeks in Treatment: 3 History of Present Illness HPI Description: ADMISSION 04/29/2020 . This is a 64 year old woman who was vacationing at [facility]. She got home and then the next day noted drainage from her foot she discovered she had an ulcer on her left first metatarsal head. 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 As far as we know her type 2 diabetes is otherwise been under good control. She has been cleaning this and applying a Band-Aid. Past medical history includes type 2 diabetes with neuropathy, history of venous insufficiency, she is a smoker ABIs in our clinic were 1.65 on the right and 1.59 on the left 6/16; patient has had a nice improvement in the wound on her left plantar foot. Surface area is much better 6/23; the wound on the left plantar first metatarsal head continues to contract. 6/30 the wound on her left first plantar metatarsal pad is totally epithelialized Electronic Signature(s) Signed: 05/21/2020 4:07:16 PM By: Lenda Kelp PA-C Signed: 05/26/2020 7:31:01 AM By: Baltazar Najjar MD Entered By: Baltazar Najjar on 05/20/2020 13:45:15 Yolanda Harris, Yolanda Harris ([id number]) -------------------------------------------------------------------------------- Physical Exam Details Patient Name: Yolanda Harris Date of Service: 05/20/2020 1:30 PM Medical Record Number: [id number] Patient Account Number: [id number] Date of Birth/Sex: 10/31/1956 (64 y.o. F) Treating RN: Huel Coventry Primary Care Provider:  Bluford Main Other Clinician: Referring Provider: Bluford Main Treating Provider/Extender: Linwood Dibbles, HOYT Weeks in Treatment: 3 Constitutional Sitting or standing Blood Pressure is within target range for patient.. Pulse regular and within target range for patient.Marland Kitchen Respirations regular, non- labored and within target range.. Temperature is normal and within the target range for the patient.Marland Kitchen appears in no distress. Cardiovascular Pedal pulses are palpable. Notes Wound exam; the area questions on the left plantar first metatarsal head. This is totally epithelialized and the area looks really healthy. Electronic Signature(s) Signed: 05/21/2020 4:07:16 PM By: Lenda Kelp PA-C Signed: 05/26/2020 7:31:01 AM By: Baltazar Najjar MD Entered By: Baltazar Najjar on 05/20/2020 13:46:30 Yolanda Harris ([id number]) -------------------------------------------------------------------------------- Physician Orders Details Patient Name: Yolanda Harris Date of Service: 05/20/2020 1:30 PM Medical Record Number: [id number] Patient Account Number: [id number] Date of Birth/Sex: 1956/06/14 (64 y.o. F) Treating RN: Huel Coventry Primary Care Provider: Bluford Main Other Clinician: Referring Provider: Bluford Main Treating Provider/Extender: Altamese Bevier in Treatment: 3 Verbal / Phone Orders: No Diagnosis Coding Electronic Signature(s) Signed: 05/20/2020 2:43:37 PM By: Rodell Perna Signed: 05/26/2020 7:31:01 AM By: Baltazar Najjar MD Previous Signature: 05/20/2020 2:35:46 PM Version By: Elliot Gurney BSN, RN, CWS, Kim RN, BSN Entered By: Rodell Perna on 05/20/2020 14:43:36 Yolanda Harris, Yolanda Harris ([id number]) -------------------------------------------------------------------------------- Problem List Details Patient Name: Yolanda Harris Date of Service: 05/20/2020 1:30 PM Medical Record Number: [id number] Patient Account Number: [id number] Date of Birth/Sex: 1955/11/26 (64  y.o. F) Treating RN: Huel Coventry Primary Care Provider: Bluford Main Other Clinician: Referring Provider: Bluford Main Treating Provider/Extender: Linwood Dibbles, HOYT Weeks in Treatment: 3 Active Problems ICD-10 Encounter Code Description Active Date MDM Diagnosis E11.621 Type 2 diabetes mellitus with foot ulcer 04/29/2020 No Yes L97.522 Non-pressure chronic ulcer of other part of left foot with fat layer  04/29/2020 No Yes exposed E11.42 Type 2 diabetes mellitus with diabetic polyneuropathy 04/29/2020 No Yes Inactive Problems Resolved Problems Electronic Signature(s) Signed: 05/21/2020 4:07:16 PM By: Lenda Kelp PA-C Signed: 05/26/2020 7:31:01 AM By: Baltazar Najjar MD Entered By: Baltazar Najjar on 05/20/2020 13:43:45 Yolanda Harris, Yolanda Harris ([id number]) -------------------------------------------------------------------------------- Progress Note Details Patient Name: Yolanda Harris Date of Service: 05/20/2020 1:30 PM Medical Record Number: [id number] Patient Account Number: [id number] Date of Birth/Sex: 1956/08/02 (64 y.o. F) Treating RN: Huel Coventry Primary Care Provider: Bluford Main Other Clinician: Referring Provider: Bluford Main Treating Provider/Extender: Linwood Dibbles, HOYT Weeks in Treatment: 3 Subjective History of Present Illness (HPI) ADMISSION 04/29/2020 . This is a 64 year old woman who was vacationing at [facility]. She got home and then the next day noted drainage from her foot she discovered she had an ulcer on her left first metatarsal head. 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 As far as we know her type 2 diabetes is otherwise been under good control. She has been cleaning this and applying a Band-Aid. Past medical history includes type 2 diabetes with neuropathy, history of venous insufficiency, she is a smoker ABIs in our clinic were 1.65 on the right and 1.59 on the left 6/16; patient has had  a nice improvement in the wound on her left plantar foot. Surface area is much better 6/23; the wound on the left plantar first metatarsal head continues to contract. 6/30 the wound on her left first plantar metatarsal pad is totally epithelialized Objective Constitutional Sitting or standing Blood Pressure is within target range for patient.. Pulse regular and within target range for patient.Marland Kitchen Respirations regular, non- labored and within target range.. Temperature is normal and within the target range for the patient.Marland Kitchen appears in no distress. Vitals Time Taken: 1:25 PM, Height: 68 in, Weight: 246 lbs, BMI: 37.4, Temperature: 98.6 F, Pulse: 78 bpm, Respiratory Rate: 18 breaths/min, Blood Pressure: 130/60 mmHg. Cardiovascular Pedal pulses are palpable. General Notes: Wound exam; the area questions on the left plantar first metatarsal head. This is totally epithelialized and the area looks really healthy. Integumentary (Hair, Skin) Wound #1 status is Healed - Epithelialized. Original cause of wound was Blister. The wound is located on the Left,Plantar Metatarsal head first. The wound measures 0cm length x 0cm width x 0cm depth; 0cm^2 area and 0cm^3 volume. There is Fat Layer (Subcutaneous Tissue) Exposed exposed. There is no tunneling or undermining noted. There is a medium amount of serous drainage noted. The wound margin is flat and intact. There is small (1-33%) pink granulation within the wound bed. There is a large (67-100%) amount of necrotic tissue within the wound bed including Eschar. Assessment Active Problems ICD-10 Type 2 diabetes mellitus with foot ulcer Non-pressure chronic ulcer of other part of left foot with fat layer exposed Type 2 diabetes mellitus with diabetic polyneuropathy Yolanda Harris, Yolanda E. ([id number]) Plan Discharge From [facility]: Wound #1 Left,Plantar Metatarsal head first: Discharge from [facility] - Treatment complete 1. Left plantar first  metatarsal head is totally healed. 2. The patient can be discharged from the clinic 3. She tells me she is getting diabetic shoes her primary doctor has prescribed these. In the interim she will pat her shoes. We also suggested routine foot inspection Electronic Signature(s) Signed: 05/21/2020 4:07:16 PM By: Lenda Kelp PA-C Signed: 05/26/2020 7:31:01 AM By: Baltazar Najjar MD Entered By: Baltazar Najjar on 05/20/2020 13:47:35 Yolanda Harris, Yolanda Harris ([id number]) -------------------------------------------------------------------------------- SuperBill Details Patient  Name: Yolanda Harris, Yolanda Harris Date of Service: 05/20/2020 Medical Record Number: [id number] Patient Account Number: [id number] Date of Birth/Sex: 08/11/1956 (64 y.o. F) Treating RN: Huel Coventry Primary Care Provider: Bluford Main Other Clinician: Referring Provider: Bluford Main Treating Provider/Extender: Altamese Ursina in Treatment: 3 Diagnosis Coding ICD-10 Codes Code Description E11.621 Type 2 diabetes mellitus with foot ulcer L97.522 Non-pressure chronic ulcer of other part of left foot with fat layer exposed E11.42 Type 2 diabetes mellitus with diabetic polyneuropathy Facility Procedures CPT4 Code: [id number] Description: [phone] - WOUND CARE VISIT-LEV 2 EST PT Modifier: Quantity: 1 Physician Procedures CPT4 Code: [id number] Description: 67209 - WC PHYS LEVEL 2 - EST PT Modifier: Quantity: 1 CPT4 Code: Description: ICD-10 Diagnosis Description E11.621 Type 2 diabetes mellitus with foot ulcer L97.522 Non-pressure chronic ulcer of other part of left foot with fat layer ex E11.42 Type 2 diabetes mellitus with diabetic polyneuropathy Modifier: posed Quantity: Electronic Signature(s) Signed: 05/20/2020 2:43:51 PM By: Rodell Perna Signed: 05/26/2020 7:31:01 AM By: Baltazar Najjar MD Entered By: Rodell Perna on 05/20/2020 14:43:51

## 2020-05-27 ENCOUNTER — Ambulatory Visit: Payer: Medicare HMO | Admitting: Internal Medicine

## 2020-06-23 ENCOUNTER — Encounter: Payer: Self-pay | Admitting: *Deleted

## 2020-06-23 ENCOUNTER — Other Ambulatory Visit: Payer: Self-pay

## 2020-06-23 DIAGNOSIS — R197 Diarrhea, unspecified: Secondary | ICD-10-CM | POA: Diagnosis not present

## 2020-06-23 DIAGNOSIS — F17210 Nicotine dependence, cigarettes, uncomplicated: Secondary | ICD-10-CM | POA: Insufficient documentation

## 2020-06-23 DIAGNOSIS — I10 Essential (primary) hypertension: Secondary | ICD-10-CM | POA: Diagnosis not present

## 2020-06-23 DIAGNOSIS — E119 Type 2 diabetes mellitus without complications: Secondary | ICD-10-CM | POA: Diagnosis not present

## 2020-06-23 DIAGNOSIS — R1084 Generalized abdominal pain: Secondary | ICD-10-CM | POA: Insufficient documentation

## 2020-06-23 LAB — LIPASE, BLOOD: Lipase: 22 U/L (ref 11–51)

## 2020-06-23 LAB — URINALYSIS, COMPLETE (UACMP) WITH MICROSCOPIC
Bilirubin Urine: NEGATIVE
Glucose, UA: >=500 mg/dL — AB
Hgb urine dipstick: NEGATIVE
Ketones, ur: NEGATIVE mg/dL
Leukocytes,Ua: NEGATIVE
Nitrite: NEGATIVE
Protein, ur: NEGATIVE mg/dL
Specific Gravity, Urine: 1.011 (ref 1.005–1.030)
pH: 6 (ref 5.0–8.0)

## 2020-06-23 LAB — COMPREHENSIVE METABOLIC PANEL
ALT: 23 U/L (ref 0–44)
AST: 34 U/L (ref 15–41)
Albumin: 4 g/dL (ref 3.5–5.0)
Alkaline Phosphatase: 127 U/L — ABNORMAL HIGH (ref 38–126)
Anion gap: 11 (ref 5–15)
BUN: 12 mg/dL (ref 8–23)
CO2: 26 mmol/L (ref 22–32)
Calcium: 9.2 mg/dL (ref 8.9–10.3)
Chloride: 101 mmol/L (ref 98–111)
Creatinine, Ser: 0.62 mg/dL (ref 0.44–1.00)
GFR calc Af Amer: >60 mL/min (ref >60)
GFR calc non Af Amer: >60 mL/min (ref >60)
Glucose, Bld: 265 mg/dL — ABNORMAL HIGH (ref 70–99)
Potassium: 5 mmol/L (ref 3.5–5.1)
Sodium: 138 mmol/L (ref 135–145)
Total Bilirubin: 1 mg/dL (ref 0.3–1.2)
Total Protein: 7.9 g/dL (ref 6.5–8.1)

## 2020-06-23 LAB — CBC
HCT: 43 % (ref 36.0–46.0)
Hemoglobin: 15.1 g/dL — ABNORMAL HIGH (ref 12.0–15.0)
MCH: 31.3 pg (ref 26.0–34.0)
MCHC: 35.1 g/dL (ref 30.0–36.0)
MCV: 89 fL (ref 80.0–100.0)
Platelets: 108 10*3/uL — ABNORMAL LOW (ref 150–400)
RBC: 4.83 MIL/uL (ref 3.87–5.11)
RDW: 16.4 % — ABNORMAL HIGH (ref 11.5–15.5)
WBC: 9.9 10*3/uL (ref 4.0–10.5)
nRBC: 0 % (ref 0.0–0.2)

## 2020-06-23 NOTE — ED Triage Notes (Signed)
Pt to ED with generalized abd pain and diarrhea that started today. Pts PCP told her to come to ED for evaluation of diverticulitis.   No fever, or vomiting.

## 2020-06-24 ENCOUNTER — Emergency Department
Admission: EM | Admit: 2020-06-24 | Discharge: 2020-06-24 | Disposition: A | Discharge status: Home / Self Care | Payer: Medicare HMO | Attending: Emergency Medicine | Admitting: Emergency Medicine

## 2020-06-24 ENCOUNTER — Emergency Department: Payer: Medicare HMO

## 2020-06-24 DIAGNOSIS — R1084 Generalized abdominal pain: Secondary | ICD-10-CM

## 2020-06-24 DIAGNOSIS — R197 Diarrhea, unspecified: Secondary | ICD-10-CM

## 2020-06-24 HISTORY — DX: Type 2 diabetes mellitus without complications: E11.9

## 2020-06-24 HISTORY — DX: Essential (primary) hypertension: I10

## 2020-06-24 HISTORY — DX: Sciatica, unspecified side: M54.30

## 2020-06-24 MED ORDER — IOHEXOL 300 MG/ML  SOLN
100.0000 mL | Freq: Once | INTRAMUSCULAR | Status: AC | PRN
  Administered 2020-06-24: 100 mL via INTRAVENOUS

## 2020-06-24 NOTE — ED Notes (Signed)
Pt to CT

## 2020-06-24 NOTE — Discharge Instructions (Addendum)

## 2020-06-24 NOTE — ED Provider Notes (Signed)
[facility] Emergency Department Provider Note  ____________________________________________   First MD Initiated Contact with Patient 06/24/20 0408     (approximate)  I have reviewed the triage vital signs and the nursing notes.   HISTORY  Chief Complaint Abdominal Pain    HPI Yolanda Harris is a 64 y.o. female with medical issues as listed below who presents for evaluation of generalized abdominal pain with diarrhea.  The symptoms started about 24 hours ago.  She is reporting generalized lower abdominal aching and occasionally sharp pain associated with multiple episodes of loose stool.  She denies fever, nausea, vomiting, chest pain, shortness of breath, dysuria, and cough.  She has not had similar symptoms in the past.   She has no history of diverticulitis but she called her primary care doctor today and he recommended that she come to the emergency department for further evaluation including to rule out diverticulitis.  Of note, the patient has been waiting for more than 8 hours in the ED waiting room due to overwhelming ED and hospital patient volumes, and she has had no additional bowel movements during that time.  She said the pain has essentially gone away as well.  She is ambulatory without difficulty and it does not cause the pain to recur.  Nothing in particular made it better or worse, the symptoms simply went away by themselves.        Past Medical History:  Diagnosis Date  . Diabetes mellitus without complication (HCC)   . Hypertension   . Sciatic leg pain     There are no problems to display for this patient.   Past Surgical History:  Procedure Laterality Date  . ABDOMINAL HYSTERECTOMY    . HTN      Prior to Admission medications   Medication Sig Start Date End Date Taking? Authorizing Provider  cyclobenzaprine (FLEXERIL) 10 MG tablet Take 1 tablet (10 mg total) by mouth 3 (three) times daily as needed for muscle spasms. 05/18/16    Darci Current, MD    Allergies Sulfa antibiotics  History reviewed. No pertinent family history.  Social History Social History   Tobacco Use  . Smoking status: Current Every Day Smoker    Packs/day: 0.50    Types: Cigarettes  . Smokeless tobacco: Never Used  Substance Use Topics  . Alcohol use: Not Currently  . Drug use: Not on file    Review of Systems Constitutional: No fever/chills Eyes: No visual changes. ENT: No sore throat. Cardiovascular: Denies chest pain. Respiratory: Denies shortness of breath. Gastrointestinal: Lower abdominal pain with diarrhea, now resolved.  No nausea nor vomiting. Genitourinary: Negative for dysuria. Musculoskeletal: Negative for neck pain.  Negative for back pain. Integumentary: Negative for rash. Neurological: Negative for headaches, focal weakness or numbness.   ____________________________________________   PHYSICAL EXAM:  VITAL SIGNS: ED Triage Vitals [06/23/20 2040]  Enc Vitals Group     BP 117/85     Pulse Rate 93     Resp 16     Temp 98.7 F (37.1 C)     Temp Source Oral     SpO2 98 %     Weight 113.4 kg (250 lb)     Height 1.727 m (5\' 8" )     Head Circumference      Peak Flow      Pain Score 8     Pain Loc      Pain Edu?      Excl. in GC?  Constitutional: Alert and oriented.  Eyes: Conjunctivae are normal.  Head: Atraumatic. Nose: No congestion/rhinnorhea. Mouth/Throat: Patient is wearing a mask. Neck: No stridor.  No meningeal signs.   Cardiovascular: Normal rate, regular rhythm. Good peripheral circulation. Grossly normal heart sounds. Respiratory: Normal respiratory effort.  No retractions. Gastrointestinal: Soft and nondistended.  No tenderness to palpation at this time. Musculoskeletal: No lower extremity tenderness nor edema. No gross deformities of extremities. Neurologic:  Normal speech and language. No gross focal neurologic deficits are appreciated.  Skin:  Skin is warm, dry and  intact. Psychiatric: Mood and affect are normal. Speech and behavior are normal.  ____________________________________________   LABS (all labs ordered are listed, but only abnormal results are displayed)  Labs Reviewed  COMPREHENSIVE METABOLIC PANEL - Abnormal; Notable for the following components:      Result Value   Glucose, Bld 265 (*)    Alkaline Phosphatase 127 (*)    All other components within normal limits  CBC - Abnormal; Notable for the following components:   Hemoglobin 15.1 (*)    RDW 16.4 (*)    Platelets 108 (*)    All other components within normal limits  URINALYSIS, COMPLETE (UACMP) WITH MICROSCOPIC - Abnormal; Notable for the following components:   Color, Urine YELLOW (*)    APPearance CLOUDY (*)    Glucose, UA >=500 (*)    Bacteria, UA MANY (*)    All other components within normal limits  LIPASE, BLOOD   ____________________________________________  EKG  No indication for emergent EKG ____________________________________________  RADIOLOGY I, Loleta Rose, personally viewed and evaluated these images (plain radiographs) as part of my medical decision making, as well as reviewing the written report by the radiologist.  ED MD interpretation:  No acute abnormalities identified on CT scan  Official radiology report(s): CT ABDOMEN PELVIS W CONTRAST  Result Date: 06/24/2020 CLINICAL DATA:  Left lower quadrant abdominal pain and diarrhea for 1 day. History of appendectomy and partial hysterectomy. EXAM: CT ABDOMEN AND PELVIS WITH CONTRAST TECHNIQUE: Multidetector CT imaging of the abdomen and pelvis was performed using the standard protocol following bolus administration of intravenous contrast. CONTRAST:  OMNIPAQUE IOHEXOL 300 MG/ML  SOLN COMPARISON:  None. FINDINGS: Lower chest: Lung bases are clear. Hepatobiliary: No focal liver abnormality is seen. No gallstones, gallbladder wall thickening, or biliary dilatation. Pancreas: Unremarkable. No pancreatic  ductal dilatation or surrounding inflammatory changes. Spleen: Normal in size without focal abnormality. Adrenals/Urinary Tract: Adrenal glands are unremarkable. Kidneys are normal, without renal calculi, focal lesion, or hydronephrosis. Bladder is unremarkable. Stomach/Bowel: Stomach and small bowel are decompressed. Scattered stool throughout the colon. Sigmoid colonic diverticulosis without evidence of diverticulitis. Appendix is not identified. Vascular/Lymphatic: Scattered aortic calcifications. No aneurysm. Prominent retroperitoneal and iliac chain pelvic lymph nodes. These are nonspecific but most likely to be reactive. Reproductive: Status post hysterectomy. No adnexal masses. Other: No abdominal wall hernia or abnormality. No abdominopelvic ascites. Musculoskeletal: No acute or significant osseous findings. IMPRESSION: 1. No acute process demonstrated in the abdomen or pelvis. No evidence of bowel obstruction or inflammation. Sigmoid colonic diverticulosis without evidence of diverticulitis. 2. Prominent retroperitoneal and iliac chain pelvic lymph nodes are nonspecific but most likely to be reactive. Consider follow-up if clinically indicated. 3. Aortic atherosclerosis. Aortic Atherosclerosis (ICD10-I70.0). Electronically Signed   By: Burman Nieves M.D.   On: 06/24/2020 05:43    ____________________________________________   PROCEDURES   Procedure(s) performed (including Critical Care):  Procedures   ____________________________________________   INITIAL IMPRESSION / MDM /  ASSESSMENT AND PLAN / ED COURSE  As part of my medical decision making, I reviewed the following data within the electronic MEDICAL RECORD NUMBER Nursing notes reviewed and incorporated, Labs reviewed , Old chart reviewed and Notes from prior ED visits   Differential diagnosis includes, but is not limited to, viral GI illness, bacterial colitis, diverticulitis, less likely appendicitis or biliary colic.  The  patient's symptoms have completely resolved on their own.  Theoretically this could be the result of a ruptured appendix or ruptured diverticula.  Given her age and the fact that she has diabetes and the severity of her symptoms earlier today, I think it is reasonable to obtain a CT scan with IV contrast to make sure there is no evidence of any emergent medical condition, but at this time her lab work is within normal limits including her metabolic panel and CBC including no leukocytosis, and her vital signs are stable with no tachycardia, normotensive, and no fever.  The patient agrees with the plan and does not require analgesia or antiemetics at this time.  I anticipate discharge with outpatient follow-up as needed if her scan is normal.       Clinical Course as of Jun 25 615  Wed Jun 24, 2020  0615 CT scan is generally reassuring.  There are some reactive lymph nodes which is likely consistent with her diarrheal illness.  Patient has been sleeping.  She has not had a bowel movement in more than 10 hours.  She is comfortable with the plan for discharge and outpatient follow-up.  I gave my usual and customary management recommendations and return precautions.  CT ABDOMEN PELVIS W CONTRAST [CF]    Clinical Course User Index [CF] Loleta Rose, MD     ____________________________________________  FINAL CLINICAL IMPRESSION(S) / ED DIAGNOSES  Final diagnoses:  Diarrhea, unspecified type  Generalized abdominal pain     MEDICATIONS GIVEN DURING THIS VISIT:  Medications  iohexol (OMNIPAQUE) 300 MG/ML solution 100 mL (100 mLs Intravenous Contrast Given 06/24/20 0515)     ED Discharge Orders    None      *Please note:  Yolanda Harris was evaluated in Emergency Department on 06/24/2020 for the symptoms described in the history of present illness. She was evaluated in the context of the global COVID-19 pandemic, which necessitated consideration that the patient might be at risk for  infection with the SARS-CoV-2 virus that causes COVID-19. Institutional protocols and algorithms that pertain to the evaluation of patients at risk for COVID-19 are in a state of rapid change based on information released by regulatory bodies including the CDC and federal and state organizations. These policies and algorithms were followed during the patient's care in the ED.  Some ED evaluations and interventions may be delayed as a result of limited staffing during and after the pandemic.*  Note:  This document was prepared using Dragon voice recognition software and may include unintentional dictation errors.   Loleta Rose, MD 06/24/20 [phone]

## 2020-09-22 ENCOUNTER — Other Ambulatory Visit: Payer: Self-pay | Admitting: Internal Medicine

## 2020-09-22 DIAGNOSIS — Z1231 Encounter for screening mammogram for malignant neoplasm of breast: Secondary | ICD-10-CM

## 2020-10-29 ENCOUNTER — Ambulatory Visit
Admission: RE | Admit: 2020-10-29 | Discharge: 2020-10-29 | Disposition: A | Discharge status: Home / Self Care | Payer: Medicare HMO | Source: Ambulatory Visit | Attending: Internal Medicine | Admitting: Internal Medicine

## 2020-10-29 ENCOUNTER — Other Ambulatory Visit: Payer: Self-pay

## 2020-10-29 DIAGNOSIS — Z1231 Encounter for screening mammogram for malignant neoplasm of breast: Secondary | ICD-10-CM | POA: Diagnosis not present

## 2021-06-14 DIAGNOSIS — F411 Generalized anxiety disorder: Secondary | ICD-10-CM | POA: Diagnosis not present

## 2021-06-14 DIAGNOSIS — L309 Dermatitis, unspecified: Secondary | ICD-10-CM | POA: Diagnosis not present

## 2021-06-14 DIAGNOSIS — E669 Obesity, unspecified: Secondary | ICD-10-CM | POA: Diagnosis not present

## 2021-06-14 DIAGNOSIS — E119 Type 2 diabetes mellitus without complications: Secondary | ICD-10-CM | POA: Diagnosis not present

## 2021-06-14 DIAGNOSIS — R609 Edema, unspecified: Secondary | ICD-10-CM | POA: Diagnosis not present

## 2021-06-14 DIAGNOSIS — I872 Venous insufficiency (chronic) (peripheral): Secondary | ICD-10-CM | POA: Diagnosis not present

## 2021-06-14 DIAGNOSIS — I10 Essential (primary) hypertension: Secondary | ICD-10-CM | POA: Diagnosis not present

## 2021-06-14 DIAGNOSIS — F17200 Nicotine dependence, unspecified, uncomplicated: Secondary | ICD-10-CM | POA: Diagnosis not present

## 2021-06-14 DIAGNOSIS — E785 Hyperlipidemia, unspecified: Secondary | ICD-10-CM | POA: Diagnosis not present

## 2021-07-13 DIAGNOSIS — L309 Dermatitis, unspecified: Secondary | ICD-10-CM | POA: Diagnosis not present

## 2021-07-13 DIAGNOSIS — F411 Generalized anxiety disorder: Secondary | ICD-10-CM | POA: Diagnosis not present

## 2021-07-13 DIAGNOSIS — R609 Edema, unspecified: Secondary | ICD-10-CM | POA: Diagnosis not present

## 2021-07-13 DIAGNOSIS — I872 Venous insufficiency (chronic) (peripheral): Secondary | ICD-10-CM | POA: Diagnosis not present

## 2021-07-13 DIAGNOSIS — E119 Type 2 diabetes mellitus without complications: Secondary | ICD-10-CM | POA: Diagnosis not present

## 2021-07-13 DIAGNOSIS — I10 Essential (primary) hypertension: Secondary | ICD-10-CM | POA: Diagnosis not present

## 2021-07-13 DIAGNOSIS — E785 Hyperlipidemia, unspecified: Secondary | ICD-10-CM | POA: Diagnosis not present

## 2021-07-13 DIAGNOSIS — E669 Obesity, unspecified: Secondary | ICD-10-CM | POA: Diagnosis not present

## 2021-07-13 DIAGNOSIS — F17200 Nicotine dependence, unspecified, uncomplicated: Secondary | ICD-10-CM | POA: Diagnosis not present

## 2021-07-21 DIAGNOSIS — E119 Type 2 diabetes mellitus without complications: Secondary | ICD-10-CM | POA: Diagnosis not present

## 2021-07-27 DIAGNOSIS — I10 Essential (primary) hypertension: Secondary | ICD-10-CM | POA: Diagnosis not present

## 2021-07-27 DIAGNOSIS — E119 Type 2 diabetes mellitus without complications: Secondary | ICD-10-CM | POA: Diagnosis not present

## 2021-07-27 DIAGNOSIS — F17200 Nicotine dependence, unspecified, uncomplicated: Secondary | ICD-10-CM | POA: Diagnosis not present

## 2021-07-27 DIAGNOSIS — I872 Venous insufficiency (chronic) (peripheral): Secondary | ICD-10-CM | POA: Diagnosis not present

## 2021-07-27 DIAGNOSIS — E669 Obesity, unspecified: Secondary | ICD-10-CM | POA: Diagnosis not present

## 2021-07-27 DIAGNOSIS — F411 Generalized anxiety disorder: Secondary | ICD-10-CM | POA: Diagnosis not present

## 2021-07-27 DIAGNOSIS — E785 Hyperlipidemia, unspecified: Secondary | ICD-10-CM | POA: Diagnosis not present

## 2021-07-27 DIAGNOSIS — L309 Dermatitis, unspecified: Secondary | ICD-10-CM | POA: Diagnosis not present

## 2021-07-27 DIAGNOSIS — R609 Edema, unspecified: Secondary | ICD-10-CM | POA: Diagnosis not present

## 2021-09-10 DIAGNOSIS — F17200 Nicotine dependence, unspecified, uncomplicated: Secondary | ICD-10-CM | POA: Diagnosis not present

## 2021-09-10 DIAGNOSIS — F411 Generalized anxiety disorder: Secondary | ICD-10-CM | POA: Diagnosis not present

## 2021-09-10 DIAGNOSIS — E119 Type 2 diabetes mellitus without complications: Secondary | ICD-10-CM | POA: Diagnosis not present

## 2021-09-10 DIAGNOSIS — E669 Obesity, unspecified: Secondary | ICD-10-CM | POA: Diagnosis not present

## 2021-09-10 DIAGNOSIS — L309 Dermatitis, unspecified: Secondary | ICD-10-CM | POA: Diagnosis not present

## 2021-09-10 DIAGNOSIS — Z23 Encounter for immunization: Secondary | ICD-10-CM | POA: Diagnosis not present

## 2021-09-10 DIAGNOSIS — E785 Hyperlipidemia, unspecified: Secondary | ICD-10-CM | POA: Diagnosis not present

## 2021-09-10 DIAGNOSIS — G8929 Other chronic pain: Secondary | ICD-10-CM | POA: Diagnosis not present

## 2021-09-10 DIAGNOSIS — I10 Essential (primary) hypertension: Secondary | ICD-10-CM | POA: Diagnosis not present

## 2021-10-08 DIAGNOSIS — I10 Essential (primary) hypertension: Secondary | ICD-10-CM | POA: Diagnosis not present

## 2021-10-08 DIAGNOSIS — E785 Hyperlipidemia, unspecified: Secondary | ICD-10-CM | POA: Diagnosis not present

## 2021-10-11 DIAGNOSIS — L309 Dermatitis, unspecified: Secondary | ICD-10-CM | POA: Diagnosis not present

## 2021-10-11 DIAGNOSIS — I872 Venous insufficiency (chronic) (peripheral): Secondary | ICD-10-CM | POA: Diagnosis not present

## 2021-10-11 DIAGNOSIS — E669 Obesity, unspecified: Secondary | ICD-10-CM | POA: Diagnosis not present

## 2021-10-11 DIAGNOSIS — E119 Type 2 diabetes mellitus without complications: Secondary | ICD-10-CM | POA: Diagnosis not present

## 2021-10-11 DIAGNOSIS — E785 Hyperlipidemia, unspecified: Secondary | ICD-10-CM | POA: Diagnosis not present

## 2021-10-11 DIAGNOSIS — I10 Essential (primary) hypertension: Secondary | ICD-10-CM | POA: Diagnosis not present

## 2021-10-11 DIAGNOSIS — R609 Edema, unspecified: Secondary | ICD-10-CM | POA: Diagnosis not present

## 2021-10-11 DIAGNOSIS — F17200 Nicotine dependence, unspecified, uncomplicated: Secondary | ICD-10-CM | POA: Diagnosis not present

## 2021-10-11 DIAGNOSIS — F411 Generalized anxiety disorder: Secondary | ICD-10-CM | POA: Diagnosis not present

## 2021-11-10 DIAGNOSIS — L309 Dermatitis, unspecified: Secondary | ICD-10-CM | POA: Diagnosis not present

## 2021-11-10 DIAGNOSIS — K5903 Drug induced constipation: Secondary | ICD-10-CM | POA: Diagnosis not present

## 2021-11-10 DIAGNOSIS — Z1331 Encounter for screening for depression: Secondary | ICD-10-CM | POA: Diagnosis not present

## 2021-11-10 DIAGNOSIS — F411 Generalized anxiety disorder: Secondary | ICD-10-CM | POA: Diagnosis not present

## 2021-11-10 DIAGNOSIS — F17200 Nicotine dependence, unspecified, uncomplicated: Secondary | ICD-10-CM | POA: Diagnosis not present

## 2021-11-10 DIAGNOSIS — I872 Venous insufficiency (chronic) (peripheral): Secondary | ICD-10-CM | POA: Diagnosis not present

## 2021-11-10 DIAGNOSIS — E669 Obesity, unspecified: Secondary | ICD-10-CM | POA: Diagnosis not present

## 2021-11-10 DIAGNOSIS — E785 Hyperlipidemia, unspecified: Secondary | ICD-10-CM | POA: Diagnosis not present

## 2021-11-10 DIAGNOSIS — I10 Essential (primary) hypertension: Secondary | ICD-10-CM | POA: Diagnosis not present

## 2021-11-10 DIAGNOSIS — R609 Edema, unspecified: Secondary | ICD-10-CM | POA: Diagnosis not present

## 2021-11-10 DIAGNOSIS — E119 Type 2 diabetes mellitus without complications: Secondary | ICD-10-CM | POA: Diagnosis not present

## 2021-11-10 DIAGNOSIS — G8929 Other chronic pain: Secondary | ICD-10-CM | POA: Diagnosis not present

## 2021-12-01 DIAGNOSIS — E119 Type 2 diabetes mellitus without complications: Secondary | ICD-10-CM | POA: Diagnosis not present

## 2021-12-10 DIAGNOSIS — E785 Hyperlipidemia, unspecified: Secondary | ICD-10-CM | POA: Diagnosis not present

## 2021-12-10 DIAGNOSIS — F17200 Nicotine dependence, unspecified, uncomplicated: Secondary | ICD-10-CM | POA: Diagnosis not present

## 2021-12-10 DIAGNOSIS — E669 Obesity, unspecified: Secondary | ICD-10-CM | POA: Diagnosis not present

## 2021-12-10 DIAGNOSIS — R609 Edema, unspecified: Secondary | ICD-10-CM | POA: Diagnosis not present

## 2021-12-10 DIAGNOSIS — E119 Type 2 diabetes mellitus without complications: Secondary | ICD-10-CM | POA: Diagnosis not present

## 2021-12-10 DIAGNOSIS — L309 Dermatitis, unspecified: Secondary | ICD-10-CM | POA: Diagnosis not present

## 2021-12-10 DIAGNOSIS — I872 Venous insufficiency (chronic) (peripheral): Secondary | ICD-10-CM | POA: Diagnosis not present

## 2021-12-10 DIAGNOSIS — F411 Generalized anxiety disorder: Secondary | ICD-10-CM | POA: Diagnosis not present

## 2021-12-10 DIAGNOSIS — I10 Essential (primary) hypertension: Secondary | ICD-10-CM | POA: Diagnosis not present

## 2021-12-22 DIAGNOSIS — R2689 Other abnormalities of gait and mobility: Secondary | ICD-10-CM | POA: Diagnosis not present

## 2021-12-22 DIAGNOSIS — M5459 Other low back pain: Secondary | ICD-10-CM | POA: Diagnosis not present

## 2021-12-22 DIAGNOSIS — M79604 Pain in right leg: Secondary | ICD-10-CM | POA: Diagnosis not present

## 2021-12-22 DIAGNOSIS — M79605 Pain in left leg: Secondary | ICD-10-CM | POA: Diagnosis not present

## 2021-12-27 DIAGNOSIS — M5459 Other low back pain: Secondary | ICD-10-CM | POA: Diagnosis not present

## 2021-12-27 DIAGNOSIS — M79605 Pain in left leg: Secondary | ICD-10-CM | POA: Diagnosis not present

## 2021-12-27 DIAGNOSIS — M79604 Pain in right leg: Secondary | ICD-10-CM | POA: Diagnosis not present

## 2021-12-27 DIAGNOSIS — R2689 Other abnormalities of gait and mobility: Secondary | ICD-10-CM | POA: Diagnosis not present

## 2021-12-31 DIAGNOSIS — M79605 Pain in left leg: Secondary | ICD-10-CM | POA: Diagnosis not present

## 2021-12-31 DIAGNOSIS — R2689 Other abnormalities of gait and mobility: Secondary | ICD-10-CM | POA: Diagnosis not present

## 2021-12-31 DIAGNOSIS — M5459 Other low back pain: Secondary | ICD-10-CM | POA: Diagnosis not present

## 2021-12-31 DIAGNOSIS — M79604 Pain in right leg: Secondary | ICD-10-CM | POA: Diagnosis not present

## 2022-01-06 DIAGNOSIS — M5459 Other low back pain: Secondary | ICD-10-CM | POA: Diagnosis not present

## 2022-01-06 DIAGNOSIS — R2689 Other abnormalities of gait and mobility: Secondary | ICD-10-CM | POA: Diagnosis not present

## 2022-01-06 DIAGNOSIS — M79605 Pain in left leg: Secondary | ICD-10-CM | POA: Diagnosis not present

## 2022-01-06 DIAGNOSIS — M79604 Pain in right leg: Secondary | ICD-10-CM | POA: Diagnosis not present

## 2022-01-10 DIAGNOSIS — M79605 Pain in left leg: Secondary | ICD-10-CM | POA: Diagnosis not present

## 2022-01-10 DIAGNOSIS — M79604 Pain in right leg: Secondary | ICD-10-CM | POA: Diagnosis not present

## 2022-01-10 DIAGNOSIS — M5459 Other low back pain: Secondary | ICD-10-CM | POA: Diagnosis not present

## 2022-01-10 DIAGNOSIS — R2689 Other abnormalities of gait and mobility: Secondary | ICD-10-CM | POA: Diagnosis not present

## 2022-01-13 DIAGNOSIS — R2689 Other abnormalities of gait and mobility: Secondary | ICD-10-CM | POA: Diagnosis not present

## 2022-01-13 DIAGNOSIS — M5459 Other low back pain: Secondary | ICD-10-CM | POA: Diagnosis not present

## 2022-01-13 DIAGNOSIS — M79604 Pain in right leg: Secondary | ICD-10-CM | POA: Diagnosis not present

## 2022-01-13 DIAGNOSIS — M79605 Pain in left leg: Secondary | ICD-10-CM | POA: Diagnosis not present

## 2022-01-17 DIAGNOSIS — M5459 Other low back pain: Secondary | ICD-10-CM | POA: Diagnosis not present

## 2022-01-17 DIAGNOSIS — M79604 Pain in right leg: Secondary | ICD-10-CM | POA: Diagnosis not present

## 2022-01-17 DIAGNOSIS — R2689 Other abnormalities of gait and mobility: Secondary | ICD-10-CM | POA: Diagnosis not present

## 2022-01-17 DIAGNOSIS — M79605 Pain in left leg: Secondary | ICD-10-CM | POA: Diagnosis not present

## 2022-01-28 DIAGNOSIS — M79604 Pain in right leg: Secondary | ICD-10-CM | POA: Diagnosis not present

## 2022-01-28 DIAGNOSIS — R2689 Other abnormalities of gait and mobility: Secondary | ICD-10-CM | POA: Diagnosis not present

## 2022-01-28 DIAGNOSIS — M79605 Pain in left leg: Secondary | ICD-10-CM | POA: Diagnosis not present

## 2022-01-28 DIAGNOSIS — M5459 Other low back pain: Secondary | ICD-10-CM | POA: Diagnosis not present

## 2022-02-08 DIAGNOSIS — I10 Essential (primary) hypertension: Secondary | ICD-10-CM | POA: Diagnosis not present

## 2022-02-08 DIAGNOSIS — E669 Obesity, unspecified: Secondary | ICD-10-CM | POA: Diagnosis not present

## 2022-02-08 DIAGNOSIS — F411 Generalized anxiety disorder: Secondary | ICD-10-CM | POA: Diagnosis not present

## 2022-02-08 DIAGNOSIS — I872 Venous insufficiency (chronic) (peripheral): Secondary | ICD-10-CM | POA: Diagnosis not present

## 2022-02-08 DIAGNOSIS — L309 Dermatitis, unspecified: Secondary | ICD-10-CM | POA: Diagnosis not present

## 2022-02-08 DIAGNOSIS — E785 Hyperlipidemia, unspecified: Secondary | ICD-10-CM | POA: Diagnosis not present

## 2022-02-08 DIAGNOSIS — G8929 Other chronic pain: Secondary | ICD-10-CM | POA: Diagnosis not present

## 2022-02-08 DIAGNOSIS — E119 Type 2 diabetes mellitus without complications: Secondary | ICD-10-CM | POA: Diagnosis not present

## 2022-02-08 DIAGNOSIS — F17200 Nicotine dependence, unspecified, uncomplicated: Secondary | ICD-10-CM | POA: Diagnosis not present

## 2022-02-24 ENCOUNTER — Other Ambulatory Visit: Payer: Self-pay | Admitting: Internal Medicine

## 2022-02-24 DIAGNOSIS — Z1231 Encounter for screening mammogram for malignant neoplasm of breast: Secondary | ICD-10-CM

## 2022-03-07 ENCOUNTER — Emergency Department: Payer: Medicare PPO

## 2022-03-07 ENCOUNTER — Other Ambulatory Visit: Payer: Self-pay

## 2022-03-07 ENCOUNTER — Encounter: Payer: Self-pay | Admitting: Emergency Medicine

## 2022-03-07 DIAGNOSIS — M7989 Other specified soft tissue disorders: Secondary | ICD-10-CM | POA: Diagnosis not present

## 2022-03-07 DIAGNOSIS — Y92096 Garden or yard of other non-institutional residence as the place of occurrence of the external cause: Secondary | ICD-10-CM | POA: Diagnosis not present

## 2022-03-07 DIAGNOSIS — E119 Type 2 diabetes mellitus without complications: Secondary | ICD-10-CM | POA: Insufficient documentation

## 2022-03-07 DIAGNOSIS — W010XXA Fall on same level from slipping, tripping and stumbling without subsequent striking against object, initial encounter: Secondary | ICD-10-CM | POA: Diagnosis not present

## 2022-03-07 DIAGNOSIS — S4992XA Unspecified injury of left shoulder and upper arm, initial encounter: Secondary | ICD-10-CM | POA: Diagnosis not present

## 2022-03-07 DIAGNOSIS — Z23 Encounter for immunization: Secondary | ICD-10-CM | POA: Insufficient documentation

## 2022-03-07 DIAGNOSIS — I10 Essential (primary) hypertension: Secondary | ICD-10-CM | POA: Diagnosis not present

## 2022-03-07 DIAGNOSIS — S5012XA Contusion of left forearm, initial encounter: Secondary | ICD-10-CM | POA: Insufficient documentation

## 2022-03-07 DIAGNOSIS — S59912A Unspecified injury of left forearm, initial encounter: Secondary | ICD-10-CM | POA: Diagnosis present

## 2022-03-07 DIAGNOSIS — S50812A Abrasion of left forearm, initial encounter: Secondary | ICD-10-CM | POA: Diagnosis not present

## 2022-03-07 NOTE — ED Triage Notes (Addendum)
Patient ambulatory to triage with steady gait, without difficulty or distress noted; pt reports tripped and fell in shrubbery today; bruising/swelling noted to left upper and lower arm; denies any other c/o or injuries ?

## 2022-03-08 ENCOUNTER — Emergency Department
Admission: EM | Admit: 2022-03-08 | Discharge: 2022-03-08 | Disposition: A | Discharge status: Home / Self Care | Payer: Medicare PPO | Attending: Emergency Medicine | Admitting: Emergency Medicine

## 2022-03-08 DIAGNOSIS — W19XXXA Unspecified fall, initial encounter: Secondary | ICD-10-CM

## 2022-03-08 DIAGNOSIS — S4992XA Unspecified injury of left shoulder and upper arm, initial encounter: Secondary | ICD-10-CM | POA: Diagnosis not present

## 2022-03-08 DIAGNOSIS — M7989 Other specified soft tissue disorders: Secondary | ICD-10-CM | POA: Diagnosis not present

## 2022-03-08 DIAGNOSIS — S5012XA Contusion of left forearm, initial encounter: Secondary | ICD-10-CM

## 2022-03-08 DIAGNOSIS — S50812A Abrasion of left forearm, initial encounter: Secondary | ICD-10-CM

## 2022-03-08 MED ORDER — TETANUS-DIPHTH-ACELL PERTUSSIS 5-2.5-18.5 LF-MCG/0.5 IM SUSY
0.5000 mL | PREFILLED_SYRINGE | Freq: Once | INTRAMUSCULAR | Status: AC
  Administered 2022-03-08: 0.5 mL via INTRAMUSCULAR
  Filled 2022-03-08: qty 0.5

## 2022-03-08 MED ORDER — BACITRACIN ZINC 500 UNIT/GM EX OINT
TOPICAL_OINTMENT | Freq: Once | CUTANEOUS | Status: AC
  Filled 2022-03-08: qty 1.8

## 2022-03-08 NOTE — ED Provider Notes (Signed)
? ?[facility] ?Provider Note ? ? ? Event Date/Time  ? First MD Initiated Contact with Patient 03/08/22 0147   ?  (approximate) ? ? ?History  ? ?Fall ? ? ?HPI ? ?Yolanda Harris is a 66 y.o. female history of hypertension, diabetes, obesity who presents to the emergency department with complaints of left arm pain after she tripped and fell in her yard today.  States she was digging a hole to plant a flower when she tripped in the hall and fell onto her left arm.  She denies hitting her head or losing consciousness.  She is not on any blood thinners.  Has an abrasion to the left forearm with soft tissue swelling and bruising.  Unsure of her last tetanus vaccine.  Denies any other injuries.  No neck or back pain, chest or abdominal pain. ? ? ?History provided by patient and family at bedside. ? ? ? ?Past Medical History:  ?Diagnosis Date  ? Diabetes mellitus without complication (HCC)   ? Hypertension   ? Sciatic leg pain   ? ? ?Past Surgical History:  ?Procedure Laterality Date  ? ABDOMINAL HYSTERECTOMY    ? HTN    ? ? ?MEDICATIONS:  ?Prior to Admission medications   ?Medication Sig Start Date End Date Taking? Authorizing Provider  ?cyclobenzaprine (FLEXERIL) 10 MG tablet Take 1 tablet (10 mg total) by mouth 3 (three) times daily as needed for muscle spasms. 05/18/16   Darci Current, MD  ? ? ?Physical Exam  ? ?Triage Vital Signs: ?ED Triage Vitals  ?Enc Vitals Group  ?   BP 03/07/22 2316 (!) 182/81  ?   Pulse Rate 03/07/22 2316 79  ?   Resp 03/07/22 2316 18  ?   Temp 03/07/22 2316 97.9 ?F (36.6 ?C)  ?   Temp Source 03/07/22 2316 Oral  ?   SpO2 03/07/22 2316 97 %  ?   Weight 03/07/22 2314 230 lb (104.3 kg)  ?   Height 03/07/22 2314 5\' 9"  (1.753 m)  ?   Head Circumference --   ?   Peak Flow --   ?   Pain Score 03/07/22 2314 8  ?   Pain Loc --   ?   Pain Edu? --   ?   Excl. in GC? --   ? ? ?Most recent vital signs: ?Vitals:  ? 03/07/22 2316  ?BP: (!) 182/81  ?Pulse: 79  ?Resp: 18  ?Temp: 97.9  ?F (36.6 ?C)  ?SpO2: 97%  ? ? ? ?CONSTITUTIONAL: Alert and oriented and responds appropriately to questions. Well-appearing; well-nourished; GCS 15 ?HEAD: Normocephalic; atraumatic ?EYES: Conjunctivae clear, PERRL, EOMI ?ENT: normal nose; no rhinorrhea; moist mucous membranes; pharynx without lesions noted; no dental injury; no septal hematoma, no epistaxis; no facial deformity or bony tenderness ?NECK: Supple, no midline spinal tenderness, step-off or deformity; trachea midline ?CARD: RRR; S1 and S2 appreciated; no murmurs, no clicks, no rubs, no gallops ?RESP: Normal chest excursion without splinting or tachypnea; breath sounds clear and equal bilaterally; no wheezes, no rhonchi, no rales; no hypoxia or respiratory distress ?CHEST:  chest wall stable, no crepitus or ecchymosis or deformity, nontender to palpation; no flail chest ?ABD/GI: Normal bowel sounds; non-distended; soft, non-tender, no rebound, no guarding; no ecchymosis or other lesions noted ?PELVIS:  stable, nontender to palpation ?BACK:  The back appears normal; no midline spinal tenderness, step-off or deformity ?EXT: Superficial abrasion noted to the mid left forearm with no active bleeding, retained foreign  body or laceration.  She has some soft tissue swelling and ecchymosis to the mid forearm but no other deformity noted.  Tender to palpation over this area but normal range of motion in all joints of the left arm, no joint effusion and compartments are soft.  2+ left radial pulse and normal capillary refill.  Normal sensation throughout the left upper extremity.  Otherwise extremities are nontender to palpation without deformity. ?SKIN: Normal color for age and race; warm ?NEURO: No facial asymmetry, normal speech, moving all extremities equally ? ?ED Results / Procedures / Treatments  ? ?LABS: ?(all labs ordered are listed, but only abnormal results are displayed) ?Labs Reviewed - No data to display ? ? ?EKG: ? EKG Interpretation ? ?Date/Time:     ?Ventricular Rate:    ?PR Interval:    ?QRS Duration:   ?QT Interval:    ?QTC Calculation:   ?R Axis:     ?Text Interpretation:   ?  ? ?  ? ? ? ? ?RADIOLOGY: ?My personal review and interpretation of imaging: X-rays of the left humerus and forearm show no fracture. ? ?I have personally reviewed all radiology reports. ?DG Forearm Left ? ?Result Date: 03/08/2022 ?CLINICAL DATA:  Fall injury with bruising and swelling. EXAM: LEFT FOREARM - 2 VIEW COMPARISON:  None. FINDINGS: There is no evidence of fracture or other focal bone lesions. Swelling and subcutaneous stranding are noted in the dorsomedial aspect of the proximal/mid forearm. IMPRESSION: Soft tissue swelling without evidence of fractures. Electronically Signed   By: Almira BarKeith  Chesser M.D.   On: 03/08/2022 00:20  ? ?DG Humerus Left ? ?Result Date: 03/08/2022 ?CLINICAL DATA:  Fall injury with a possible left humerus fracture. EXAM: LEFT HUMERUS - 2+ VIEW COMPARISON:  None. FINDINGS: There is no evidence of fracture, dislocation or other focal bone lesions. Soft tissues are unremarkable. IMPRESSION: Negative. Electronically Signed   By: Almira BarKeith  Chesser M.D.   On: 03/08/2022 00:18   ? ? ?PROCEDURES: ? ?Critical Care performed: No ? ? ? ? ? ?Procedures ? ? ? ?IMPRESSION / MDM / ASSESSMENT AND PLAN / ED COURSE  ?I reviewed the triage vital signs and the nursing notes. ? ?Patient here after mechanical fall with left arm pain.  Denies head injury. ? ? ? ? ?DIFFERENTIAL DIAGNOSIS (includes but not limited to):   Contusion, hematoma, fracture, dislocation, abrasion ? ? ?PLAN: We will obtain x-rays of the left humerus and forearm.  We will update her tetanus vaccine.  Have offered her pain medication which she declines.  We will clean the wound to her left arm and apply bacitracin and a sterile dressing. ? ? ?MEDICATIONS GIVEN IN ED: ?Medications  ?bacitracin ointment (has no administration in time range)  ?Tdap (BOOSTRIX) injection 0.5 mL (has no administration in time  range)  ? ? ? ?ED COURSE: X-rays reviewed by myself and radiologist and show no fracture or dislocation.  Discussed with patient that she has a contusion and abrasion.  Discussed wound care instructions, supportive care instructions and return precautions.  Recommended alternating Tylenol and Motrin over-the-counter as needed.  Patient comfortable with this plan. ? ? ?CONSULTS: No orthopedic consult needed at this time given x-ray showed no fracture or dislocation. ? ? ?OUTSIDE RECORDS REVIEWED: Reviewed patient's last office visit with rheumatology, Dr. Kathi LudwigSyed, on 03/02/2015. ? ? ? ? ? ? ? ? ?FINAL CLINICAL IMPRESSION(S) / ED DIAGNOSES  ? ?Final diagnoses:  ?Fall, initial encounter  ?Abrasion of left forearm, initial encounter  ?Contusion  of left forearm, initial encounter  ? ? ? ?Rx / DC Orders  ? ?ED Discharge Orders   ? ? None  ? ?  ? ? ? ?Note:  This document was prepared using Dragon voice recognition software and may include unintentional dictation errors. ?  ?Bobi Daudelin, Layla Maw, DO ?03/08/22 0239 ? ?

## 2022-03-08 NOTE — Discharge Instructions (Addendum)
You may alternate Tylenol 1000 mg every 6 hours as needed for pain, fever and Ibuprofen 800 mg every 6-8 hours as needed for pain, fever.  Please take Ibuprofen with food.  Do not take more than 4000 mg of Tylenol (acetaminophen) in a 24 hour period. ° °

## 2022-03-08 NOTE — ED Notes (Signed)
Wound on posterior left forearm cleaned with chlorhexidine soap, rinsed with saline, Telfa dressing applied and secured with kerlex gauze. Pt educated on how to clean and dress wound at home. Pt verbalized understanding.  ?

## 2022-03-10 DIAGNOSIS — M79602 Pain in left arm: Secondary | ICD-10-CM | POA: Diagnosis not present

## 2022-03-10 DIAGNOSIS — L03114 Cellulitis of left upper limb: Secondary | ICD-10-CM | POA: Diagnosis not present

## 2022-03-10 DIAGNOSIS — E119 Type 2 diabetes mellitus without complications: Secondary | ICD-10-CM | POA: Diagnosis not present

## 2022-03-21 DIAGNOSIS — E119 Type 2 diabetes mellitus without complications: Secondary | ICD-10-CM | POA: Diagnosis not present

## 2022-03-21 DIAGNOSIS — L03114 Cellulitis of left upper limb: Secondary | ICD-10-CM | POA: Diagnosis not present

## 2022-04-01 ENCOUNTER — Ambulatory Visit
Admission: RE | Admit: 2022-04-01 | Discharge: 2022-04-01 | Disposition: A | Discharge status: Home / Self Care | Payer: Medicare PPO | Source: Ambulatory Visit | Attending: Internal Medicine | Admitting: Internal Medicine

## 2022-04-01 DIAGNOSIS — Z1231 Encounter for screening mammogram for malignant neoplasm of breast: Secondary | ICD-10-CM | POA: Insufficient documentation

## 2022-04-04 DIAGNOSIS — E119 Type 2 diabetes mellitus without complications: Secondary | ICD-10-CM | POA: Diagnosis not present

## 2022-04-08 DIAGNOSIS — F411 Generalized anxiety disorder: Secondary | ICD-10-CM | POA: Diagnosis not present

## 2022-04-08 DIAGNOSIS — L309 Dermatitis, unspecified: Secondary | ICD-10-CM | POA: Diagnosis not present

## 2022-04-08 DIAGNOSIS — E669 Obesity, unspecified: Secondary | ICD-10-CM | POA: Diagnosis not present

## 2022-04-08 DIAGNOSIS — F17200 Nicotine dependence, unspecified, uncomplicated: Secondary | ICD-10-CM | POA: Diagnosis not present

## 2022-04-08 DIAGNOSIS — I10 Essential (primary) hypertension: Secondary | ICD-10-CM | POA: Diagnosis not present

## 2022-04-08 DIAGNOSIS — R609 Edema, unspecified: Secondary | ICD-10-CM | POA: Diagnosis not present

## 2022-04-08 DIAGNOSIS — E119 Type 2 diabetes mellitus without complications: Secondary | ICD-10-CM | POA: Diagnosis not present

## 2022-04-08 DIAGNOSIS — I872 Venous insufficiency (chronic) (peripheral): Secondary | ICD-10-CM | POA: Diagnosis not present

## 2022-04-08 DIAGNOSIS — E785 Hyperlipidemia, unspecified: Secondary | ICD-10-CM | POA: Diagnosis not present

## 2022-06-08 DIAGNOSIS — F411 Generalized anxiety disorder: Secondary | ICD-10-CM | POA: Diagnosis not present

## 2022-06-08 DIAGNOSIS — I872 Venous insufficiency (chronic) (peripheral): Secondary | ICD-10-CM | POA: Diagnosis not present

## 2022-06-08 DIAGNOSIS — R609 Edema, unspecified: Secondary | ICD-10-CM | POA: Diagnosis not present

## 2022-06-08 DIAGNOSIS — L309 Dermatitis, unspecified: Secondary | ICD-10-CM | POA: Diagnosis not present

## 2022-06-08 DIAGNOSIS — E785 Hyperlipidemia, unspecified: Secondary | ICD-10-CM | POA: Diagnosis not present

## 2022-06-08 DIAGNOSIS — F17200 Nicotine dependence, unspecified, uncomplicated: Secondary | ICD-10-CM | POA: Diagnosis not present

## 2022-06-08 DIAGNOSIS — E119 Type 2 diabetes mellitus without complications: Secondary | ICD-10-CM | POA: Diagnosis not present

## 2022-06-08 DIAGNOSIS — I10 Essential (primary) hypertension: Secondary | ICD-10-CM | POA: Diagnosis not present

## 2022-06-08 DIAGNOSIS — E669 Obesity, unspecified: Secondary | ICD-10-CM | POA: Diagnosis not present

## 2022-07-04 DIAGNOSIS — I10 Essential (primary) hypertension: Secondary | ICD-10-CM | POA: Diagnosis not present

## 2022-07-04 DIAGNOSIS — E119 Type 2 diabetes mellitus without complications: Secondary | ICD-10-CM | POA: Diagnosis not present

## 2022-08-08 DIAGNOSIS — E119 Type 2 diabetes mellitus without complications: Secondary | ICD-10-CM | POA: Diagnosis not present

## 2022-08-08 DIAGNOSIS — L309 Dermatitis, unspecified: Secondary | ICD-10-CM | POA: Diagnosis not present

## 2022-08-08 DIAGNOSIS — F17200 Nicotine dependence, unspecified, uncomplicated: Secondary | ICD-10-CM | POA: Diagnosis not present

## 2022-08-08 DIAGNOSIS — E785 Hyperlipidemia, unspecified: Secondary | ICD-10-CM | POA: Diagnosis not present

## 2022-08-08 DIAGNOSIS — I10 Essential (primary) hypertension: Secondary | ICD-10-CM | POA: Diagnosis not present

## 2022-08-08 DIAGNOSIS — R609 Edema, unspecified: Secondary | ICD-10-CM | POA: Diagnosis not present

## 2022-08-08 DIAGNOSIS — I872 Venous insufficiency (chronic) (peripheral): Secondary | ICD-10-CM | POA: Diagnosis not present

## 2022-08-08 DIAGNOSIS — F411 Generalized anxiety disorder: Secondary | ICD-10-CM | POA: Diagnosis not present

## 2022-08-08 DIAGNOSIS — E669 Obesity, unspecified: Secondary | ICD-10-CM | POA: Diagnosis not present

## 2022-10-05 DIAGNOSIS — I10 Essential (primary) hypertension: Secondary | ICD-10-CM | POA: Diagnosis not present

## 2022-10-05 DIAGNOSIS — E785 Hyperlipidemia, unspecified: Secondary | ICD-10-CM | POA: Diagnosis not present

## 2022-10-05 DIAGNOSIS — F411 Generalized anxiety disorder: Secondary | ICD-10-CM | POA: Diagnosis not present

## 2022-10-05 DIAGNOSIS — L309 Dermatitis, unspecified: Secondary | ICD-10-CM | POA: Diagnosis not present

## 2022-10-05 DIAGNOSIS — G8929 Other chronic pain: Secondary | ICD-10-CM | POA: Diagnosis not present

## 2022-10-05 DIAGNOSIS — F17200 Nicotine dependence, unspecified, uncomplicated: Secondary | ICD-10-CM | POA: Diagnosis not present

## 2022-10-05 DIAGNOSIS — E119 Type 2 diabetes mellitus without complications: Secondary | ICD-10-CM | POA: Diagnosis not present

## 2022-10-05 DIAGNOSIS — I872 Venous insufficiency (chronic) (peripheral): Secondary | ICD-10-CM | POA: Diagnosis not present

## 2022-10-05 DIAGNOSIS — E669 Obesity, unspecified: Secondary | ICD-10-CM | POA: Diagnosis not present

## 2022-12-14 ENCOUNTER — Other Ambulatory Visit: Payer: Self-pay | Admitting: Internal Medicine

## 2022-12-14 DIAGNOSIS — E669 Obesity, unspecified: Secondary | ICD-10-CM | POA: Diagnosis not present

## 2022-12-14 DIAGNOSIS — Z0001 Encounter for general adult medical examination with abnormal findings: Secondary | ICD-10-CM | POA: Diagnosis not present

## 2022-12-14 DIAGNOSIS — E785 Hyperlipidemia, unspecified: Secondary | ICD-10-CM | POA: Diagnosis not present

## 2022-12-14 DIAGNOSIS — G8929 Other chronic pain: Secondary | ICD-10-CM | POA: Diagnosis not present

## 2022-12-14 DIAGNOSIS — E119 Type 2 diabetes mellitus without complications: Secondary | ICD-10-CM | POA: Diagnosis not present

## 2022-12-19 DIAGNOSIS — E119 Type 2 diabetes mellitus without complications: Secondary | ICD-10-CM | POA: Diagnosis not present

## 2022-12-19 DIAGNOSIS — F411 Generalized anxiety disorder: Secondary | ICD-10-CM | POA: Diagnosis not present

## 2022-12-19 DIAGNOSIS — F17210 Nicotine dependence, cigarettes, uncomplicated: Secondary | ICD-10-CM | POA: Diagnosis not present

## 2022-12-19 DIAGNOSIS — I10 Essential (primary) hypertension: Secondary | ICD-10-CM | POA: Diagnosis not present

## 2022-12-19 DIAGNOSIS — E785 Hyperlipidemia, unspecified: Secondary | ICD-10-CM | POA: Diagnosis not present

## 2022-12-19 DIAGNOSIS — L309 Dermatitis, unspecified: Secondary | ICD-10-CM | POA: Diagnosis not present

## 2022-12-19 DIAGNOSIS — R000 Tachycardia, unspecified: Secondary | ICD-10-CM | POA: Diagnosis not present

## 2022-12-19 DIAGNOSIS — E669 Obesity, unspecified: Secondary | ICD-10-CM | POA: Diagnosis not present

## 2022-12-19 DIAGNOSIS — I4891 Unspecified atrial fibrillation: Secondary | ICD-10-CM | POA: Diagnosis not present

## 2022-12-26 LAB — LIPID PANEL W/O CHOL/HDL RATIO
Cholesterol, Total: 119 mg/dL (ref 100–199)
HDL: 50 mg/dL (ref >39)
LDL Chol Calc (NIH): 44 mg/dL (ref 0–99)
Triglycerides: 148 mg/dL (ref 0–149)
VLDL Cholesterol Cal: 25 mg/dL (ref 5–40)

## 2022-12-26 LAB — TSH: TSH: 4.63 u[IU]/mL — ABNORMAL HIGH (ref 0.450–4.500)

## 2022-12-26 LAB — DRUG SCREEN 13 W/CONF , SERUM
Amphetamines, IA: NEGATIVE ng/mL
Barbiturates, IA: NEGATIVE ug/mL
Benzodiazepines, IA: NEGATIVE ng/mL
Cocaine & Metabolite, IA: NEGATIVE ng/mL
FENTANYL, IA: NEGATIVE ng/mL
MEPERIDINE, IA: NEGATIVE ng/mL
Methadone, IA: NEGATIVE ng/mL
Opiates, IA: POSITIVE ng/mL — AB
Oxycodones, IA: NEGATIVE ng/mL
Phencyclidine, IA: NEGATIVE ng/mL
Propoxyphene, IA: NEGATIVE ng/mL
THC(Marijuana) Metabolite, IA: NEGATIVE ng/mL
TRAMADOL, IA: NEGATIVE ng/mL

## 2022-12-26 LAB — CBC WITH DIFFERENTIAL/PLATELET
Basophils Absolute: 0.1 10*3/uL (ref 0.0–0.2)
Basos: 1 %
EOS (ABSOLUTE): 0.5 10*3/uL — ABNORMAL HIGH (ref 0.0–0.4)
Eos: 8 %
Hematocrit: 40.3 % (ref 34.0–46.6)
Hemoglobin: 13.8 g/dL (ref 11.1–15.9)
Immature Grans (Abs): 0 10*3/uL (ref 0.0–0.1)
Immature Granulocytes: 0 %
Lymphocytes Absolute: 3 10*3/uL (ref 0.7–3.1)
Lymphs: 44 %
MCH: 31.3 pg (ref 26.6–33.0)
MCHC: 34.2 g/dL (ref 31.5–35.7)
MCV: 91 fL (ref 79–97)
Monocytes Absolute: 0.6 10*3/uL (ref 0.1–0.9)
Monocytes: 9 %
Neutrophils Absolute: 2.6 10*3/uL (ref 1.4–7.0)
Neutrophils: 38 %
Platelets: 91 10*3/uL — CL (ref 150–450)
RBC: 4.41 x10E6/uL (ref 3.77–5.28)
RDW: 15.9 % — ABNORMAL HIGH (ref 11.7–15.4)
WBC: 6.8 10*3/uL (ref 3.4–10.8)

## 2022-12-26 LAB — OPIATES,MS,WB/SP RFX
6-Acetylmorphine: NEGATIVE
Codeine: NEGATIVE ng/mL
Dihydrocodeine: NEGATIVE ng/mL
Hydrocodone: NEGATIVE ng/mL
Hydromorphone: NEGATIVE ng/mL
Morphine: 21.6 ng/mL
Opiate Confirmation: POSITIVE

## 2022-12-26 LAB — COMPREHENSIVE METABOLIC PANEL
ALT: 16 IU/L (ref 0–32)
AST: 26 IU/L (ref 0–40)
Albumin/Globulin Ratio: 1.1 — ABNORMAL LOW (ref 1.2–2.2)
Albumin: 3.4 g/dL — ABNORMAL LOW (ref 3.9–4.9)
Alkaline Phosphatase: 141 IU/L — ABNORMAL HIGH (ref 44–121)
BUN/Creatinine Ratio: 16 (ref 12–28)
BUN: 10 mg/dL (ref 8–27)
Bilirubin Total: 0.7 mg/dL (ref 0.0–1.2)
CO2: 24 mmol/L (ref 20–29)
Calcium: 8.8 mg/dL (ref 8.7–10.3)
Chloride: 105 mmol/L (ref 96–106)
Creatinine, Ser: 0.63 mg/dL (ref 0.57–1.00)
Globulin, Total: 3 g/dL (ref 1.5–4.5)
Glucose: 145 mg/dL — ABNORMAL HIGH (ref 70–99)
Potassium: 4.2 mmol/L (ref 3.5–5.2)
Sodium: 142 mmol/L (ref 134–144)
Total Protein: 6.4 g/dL (ref 6.0–8.5)
eGFR: 97 mL/min/{1.73_m2} (ref >59)

## 2022-12-26 LAB — HGB A1C W/O EAG: Hgb A1c MFr Bld: 6.6 % — ABNORMAL HIGH (ref 4.8–5.6)

## 2022-12-26 LAB — OXYCODONES,MS,WB/SP RFX
Oxycocone: NEGATIVE ng/mL
Oxycodones Confirmation: NEGATIVE
Oxymorphone: NEGATIVE ng/mL

## 2023-01-03 ENCOUNTER — Encounter: Payer: Self-pay | Admitting: Cardiovascular Disease

## 2023-01-03 ENCOUNTER — Other Ambulatory Visit: Payer: Self-pay

## 2023-01-03 ENCOUNTER — Ambulatory Visit (INDEPENDENT_AMBULATORY_CARE_PROVIDER_SITE_OTHER): Payer: Medicare HMO | Admitting: Cardiovascular Disease

## 2023-01-03 VITALS — BP 140/88 | HR 75 | Ht 69.0 in | Wt 265.0 lb

## 2023-01-03 DIAGNOSIS — R0602 Shortness of breath: Secondary | ICD-10-CM | POA: Diagnosis not present

## 2023-01-03 DIAGNOSIS — I259 Chronic ischemic heart disease, unspecified: Secondary | ICD-10-CM

## 2023-01-03 DIAGNOSIS — R42 Dizziness and giddiness: Secondary | ICD-10-CM | POA: Diagnosis not present

## 2023-01-03 DIAGNOSIS — Z8679 Personal history of other diseases of the circulatory system: Secondary | ICD-10-CM

## 2023-01-03 DIAGNOSIS — R0789 Other chest pain: Secondary | ICD-10-CM | POA: Diagnosis not present

## 2023-01-03 MED ORDER — MORPHINE SULFATE ER 15 MG PO TBCR: 15.0000 mg | EXTENDED_RELEASE_TABLET | Freq: Two times a day (BID) | ORAL | 0 refills | Status: DC

## 2023-01-03 MED ORDER — AMIODARONE HCL 400 MG PO TABS: 400.0000 mg | ORAL_TABLET | Freq: Two times a day (BID) | ORAL | 0 refills | Status: DC

## 2023-01-03 NOTE — Progress Notes (Unsigned)
Cardiology Office Note   Date:  01/03/2023   ID:  JOMAIRA DEMIAN, DOB Jul 09, 1956, MRN [id number]  PCP:  Jodi Marble, MD  Cardiologist:  Neoma Laming, MD      History of Present Illness: Yolanda Harris is a 67 y.o. female who presents for  Chief Complaint  Patient presents with   Follow-up    Consult-a fib    Chest Pain  This is a new problem. The current episode started 1 to 4 weeks ago. The onset quality is gradual. The problem occurs 2 to 4 times per day. The problem has been gradually improving. The pain is at a severity of 3/10. The quality of the pain is described as dull and burning. Associated symptoms include dizziness, palpitations, shortness of breath and weakness.  Palpitations  This is a new problem. The current episode started more than 1 month ago. The problem occurs constantly. The problem has been gradually worsening. On average, each episode lasts 40 hours. Associated symptoms include chest pain, dizziness, shortness of breath and weakness. She has tried beta blockers for the symptoms. The treatment provided mild relief.    Past Medical History:  Diagnosis Date   Diabetes mellitus without complication ([place])    Hypertension    Sciatic leg pain      Past Surgical History:  Procedure Laterality Date   ABDOMINAL HYSTERECTOMY     HTN       Current Outpatient Medications  Medication Sig Dispense Refill   amiodarone (PACERONE) 400 MG tablet Take 1 tablet (400 mg total) by mouth 2 (two) times daily. 60 tablet 0   cyclobenzaprine (FLEXERIL) 10 MG tablet Take 1 tablet (10 mg total) by mouth 3 (three) times daily as needed for muscle spasms. 30 tablet 0   ELIQUIS 5 MG TABS tablet Take 5 mg by mouth 2 (two) times daily.     metoprolol tartrate (LOPRESSOR) 25 MG tablet Take 25 mg by mouth 2 (two) times daily.     TRESIBA FLEXTOUCH 200 UNIT/ML FlexTouch Pen Inject 200 Units into the skin 1 day or 1 dose.     amitriptyline (ELAVIL) 25 MG tablet Take  25 mg by mouth every morning.     amLODipine-benazepril (LOTREL) 5-20 MG capsule Take 1 capsule by mouth daily.     meloxicam (MOBIC) 15 MG tablet Take 15 mg by mouth daily.     morphine (MS CONTIN) 15 MG 12 hr tablet Take 15 mg by mouth 2 (two) times daily.     pioglitazone (ACTOS) 45 MG tablet Take 45 mg by mouth daily.     pregabalin (LYRICA) 75 MG capsule Take by mouth 2 (two) times daily.     No current facility-administered medications for this visit.    Allergies:   Sulfa antibiotics    Social History:   reports that she has been smoking cigarettes. She has been smoking an average of .5 packs per day. She has never used smokeless tobacco. She reports that she does not currently use alcohol. She reports that she does not use drugs.   Family History:  family history is not on file.    ROS:     Review of Systems  Constitutional: Negative.   HENT: Negative.    Eyes: Negative.   Respiratory:  Positive for shortness of breath.   Cardiovascular:  Positive for chest pain and palpitations.  Gastrointestinal: Negative.   Genitourinary: Negative.   Musculoskeletal: Negative.   Skin: Negative.   Neurological:  Positive for dizziness and weakness.  Endo/Heme/Allergies: Negative.   Psychiatric/Behavioral: Negative.    All other systems reviewed and are negative.     All other systems are reviewed and negative.    PHYSICAL EXAM: VS:  BP (!) 140/88   Pulse 75   Ht 5' 9"$  (1.753 m)   Wt 265 lb (120.2 kg)   SpO2 94%   BMI 39.13 kg/m  , BMI Body mass index is 39.13 kg/m. Last weight:  Wt Readings from Last 3 Encounters:  01/03/23 265 lb (120.2 kg)  03/07/22 230 lb (104.3 kg)  06/23/20 250 lb (113.4 kg)     Physical Exam Constitutional:      Appearance: Normal appearance.  Cardiovascular:     Rate and Rhythm: Normal rate and regular rhythm.     Heart sounds: Normal heart sounds.  Pulmonary:     Effort: Pulmonary effort is normal.     Breath sounds: Normal breath  sounds.  Musculoskeletal:     Right lower leg: No edema.     Left lower leg: No edema.  Neurological:     Mental Status: She is alert.       EKG: afib 130/min ASWMI, nonspecific st changes    on 12/19/22  Recent Labs: 12/14/2022: ALT 16; BUN 10; Creatinine, Ser 0.63; Hemoglobin 13.8; Platelets 91; Potassium 4.2; Sodium 142; TSH 4.630    Lipid Panel    Component Value Date/Time   CHOL 119 12/14/2022 0922   TRIG 148 12/14/2022 0922   HDL 50 12/14/2022 0922   LDLCALC 44 12/14/2022 0922      Other studies Reviewed: REASON FOR VISIT  Visit for: Echocardiogram - SOB, chest pain  Sex: Female   wt= 205 Lbs.  BP= 112/70  Height= 69 inches.        TESTS  Imaging: Echocardiogram:  An echocardiogram in (2-d) mode was performed and in Doppler mode with color flow velocity mapping was performed. The aortic valve cusps are normal 1.7 cm, flow velocity was normal 1.2 m/s, and normal calculated aortic valve systolic mean flow gradient 3.2 mmHg. Mitral valve diastolic peak flow velocity E .89 m/s and E/A ratio 1.1. Aortic root diameter 2.1 cm. The LVOT internal diameter was normal 1.7 cm and flow velocity was normal 1.0 m/s. LV systolic dimension 1.6 cm, diastolic 3.5 cm, posterior wall thickness 1.6 cm, fractional shortening 53 %, and EF 85 %. IVS thickness 1.6 cm. LA dimension 4.3 cm. Mitral Valve =  Ea= 10.1  DT= .230  A- wave duration = .100 m.sec. Tricuspid Valve =  TR jet V= 2.7     RAP= 5  RVSP= 35.3 mmHg. Pulmonic Valve= PIEDV= 1.2 m/s. Mitral Valve is Mild Regurgitation. Pulmonic Valve has Trace to Mild Regurgitation. Tricuspid Valve has Mild Regurgitation.     ASSESSMENT  Mildly dilated left atrium with rest of the chambers of normal size.  Normal LV systolic function .  Normal wall motion.  Mild left ventricular hypertrophy with mild diastolic dysfunction.  Trace to mild pulmonary regurgitation.  Mild tricuspid regurgitation.   Normal pulmonary artery  pressure.   Mild mitral regurgitation.  No pericardial effusion.   THERAPY   Referring physician: Gabriel Harris   Sonographer: Iverson Alamin, RCS.  Neoma Laming, MD  Electronically signed by: Neoma Laming     Date: 04/01/2013 13:09 Additional studies/ records that were reviewed today include:  REASON FOR VISIT  Visit for: Echocardiogram - SOB, chest pain  Sex: Female   wt= 205 Lbs.  BP= 112/70  Height= 69 inches.        TESTS  Imaging: Echocardiogram:  An echocardiogram in (2-d) mode was performed and in Doppler mode with color flow velocity mapping was performed. The aortic valve cusps are normal 1.7 cm, flow velocity was normal 1.2 m/s, and normal calculated aortic valve systolic mean flow gradient 3.2 mmHg. Mitral valve diastolic peak flow velocity E .89 m/s and E/A ratio 1.1. Aortic root diameter 2.1 cm. The LVOT internal diameter was normal 1.7 cm and flow velocity was normal 1.0 m/s. LV systolic dimension 1.6 cm, diastolic 3.5 cm, posterior wall thickness 1.6 cm, fractional shortening 53 %, and EF 85 %. IVS thickness 1.6 cm. LA dimension 4.3 cm. Mitral Valve =  Ea= 10.1  DT= .230  A- wave duration = .100 m.sec. Tricuspid Valve =  TR jet V= 2.7     RAP= 5  RVSP= 35.3 mmHg. Pulmonic Valve= PIEDV= 1.2 m/s. Mitral Valve is Mild Regurgitation. Pulmonic Valve has Trace to Mild Regurgitation. Tricuspid Valve has Mild Regurgitation.     ASSESSMENT  Mildly dilated left atrium with rest of the chambers of normal size.  Normal LV systolic function .  Normal wall motion.  Mild left ventricular hypertrophy with mild diastolic dysfunction.  Trace to mild pulmonary regurgitation.  Mild tricuspid regurgitation.   Normal pulmonary artery pressure.   Mild mitral regurgitation.  No pericardial effusion.   THERAPY   Referring physician: Gabriel Harris   Sonographer: Iverson Alamin, RCS.  Neoma Laming, MD  Electronically signed by: Neoma Laming     Date: 04/01/2013  13:09 Review of the above records demonstrates:       No data to display            ASSESSMENT AND PLAN:    ICD-10-CM   1. Other chest pain  R07.89 PCV ECHOCARDIOGRAM COMPLETE   do echo and stress test    2. SOB (shortness of breath)  R06.02 PCV ECHOCARDIOGRAM COMPLETE    3. Dizziness  R42 PCV ECHOCARDIOGRAM COMPLETE    4. Atrial fibrillation, currently in sinus rhythm  Z86.79 PCV ECHOCARDIOGRAM COMPLETE    amiodarone (PACERONE) 400 MG tablet   add amiodrone 400 bid    5. Chest pain due to myocardial ischemia, unspecified ischemic chest pain type  I25.9 NM Myocar Multi W/Spect W/Wall Motion / EF    PCV ECHOCARDIOGRAM COMPLETE       Problem List Items Addressed This Visit   None Visit Diagnoses     Other chest pain    -  Primary   do echo and stress test   Relevant Orders   PCV ECHOCARDIOGRAM COMPLETE   SOB (shortness of breath)       Relevant Orders   PCV ECHOCARDIOGRAM COMPLETE   Dizziness       Relevant Orders   PCV ECHOCARDIOGRAM COMPLETE   Atrial fibrillation, currently in sinus rhythm       add amiodrone 400 bid   Relevant Medications   amiodarone (PACERONE) 400 MG tablet   Other Relevant Orders   PCV ECHOCARDIOGRAM COMPLETE   Chest pain due to myocardial ischemia, unspecified ischemic chest pain type       Relevant Orders   NM Myocar Multi W/Spect W/Wall Motion / EF   PCV ECHOCARDIOGRAM COMPLETE       Disposition:   Return in about 4 weeks (around 01/31/2023), or f/u after echo and stress tst.    minutes  Signed,  Neoma Laming, MD  01/03/2023  2:23 [place]

## 2023-01-05 ENCOUNTER — Other Ambulatory Visit: Payer: Self-pay | Admitting: Cardiovascular Disease

## 2023-01-05 ENCOUNTER — Encounter: Payer: Self-pay | Admitting: Cardiovascular Disease

## 2023-01-12 ENCOUNTER — Ambulatory Visit (INDEPENDENT_AMBULATORY_CARE_PROVIDER_SITE_OTHER): Payer: Medicare HMO | Admitting: Cardiovascular Disease

## 2023-01-12 DIAGNOSIS — R42 Dizziness and giddiness: Secondary | ICD-10-CM

## 2023-01-12 DIAGNOSIS — Z8679 Personal history of other diseases of the circulatory system: Secondary | ICD-10-CM

## 2023-01-12 DIAGNOSIS — R0789 Other chest pain: Secondary | ICD-10-CM | POA: Diagnosis not present

## 2023-01-12 DIAGNOSIS — I361 Nonrheumatic tricuspid (valve) insufficiency: Secondary | ICD-10-CM | POA: Diagnosis not present

## 2023-01-12 DIAGNOSIS — I259 Chronic ischemic heart disease, unspecified: Secondary | ICD-10-CM

## 2023-01-12 DIAGNOSIS — I340 Nonrheumatic mitral (valve) insufficiency: Secondary | ICD-10-CM | POA: Diagnosis not present

## 2023-01-12 DIAGNOSIS — R0602 Shortness of breath: Secondary | ICD-10-CM

## 2023-01-14 ENCOUNTER — Other Ambulatory Visit: Payer: Self-pay | Admitting: Internal Medicine

## 2023-01-18 ENCOUNTER — Ambulatory Visit (INDEPENDENT_AMBULATORY_CARE_PROVIDER_SITE_OTHER): Payer: Medicare HMO

## 2023-01-18 DIAGNOSIS — R079 Chest pain, unspecified: Secondary | ICD-10-CM

## 2023-01-18 DIAGNOSIS — I259 Chronic ischemic heart disease, unspecified: Secondary | ICD-10-CM

## 2023-01-18 MED ORDER — TECHNETIUM TC 99M SESTAMIBI GENERIC - CARDIOLITE
32.5000 | Freq: Once | INTRAVENOUS | Status: AC | PRN
  Administered 2023-01-18: 32.5 via INTRAVENOUS

## 2023-01-18 MED ORDER — TECHNETIUM TC 99M SESTAMIBI GENERIC - CARDIOLITE
10.1000 | Freq: Once | INTRAVENOUS | Status: AC | PRN
  Administered 2023-01-18: 10.1 via INTRAVENOUS

## 2023-01-23 ENCOUNTER — Encounter: Payer: Self-pay | Admitting: Cardiovascular Disease

## 2023-01-23 ENCOUNTER — Ambulatory Visit (INDEPENDENT_AMBULATORY_CARE_PROVIDER_SITE_OTHER): Payer: Medicare HMO | Admitting: Cardiovascular Disease

## 2023-01-23 VITALS — BP 132/70 | HR 79 | Ht 69.0 in | Wt 267.0 lb

## 2023-01-23 DIAGNOSIS — R9439 Abnormal result of other cardiovascular function study: Secondary | ICD-10-CM

## 2023-01-23 DIAGNOSIS — R0602 Shortness of breath: Secondary | ICD-10-CM

## 2023-01-23 DIAGNOSIS — E119 Type 2 diabetes mellitus without complications: Secondary | ICD-10-CM | POA: Diagnosis not present

## 2023-01-23 DIAGNOSIS — Z8679 Personal history of other diseases of the circulatory system: Secondary | ICD-10-CM | POA: Diagnosis not present

## 2023-01-23 NOTE — Patient Instructions (Addendum)
Take metoprolol 50 mg night prior to test, and 50 mg 90 minutes prior to test.

## 2023-01-23 NOTE — Progress Notes (Signed)
Cardiology Office Note   Date:  01/23/2023   ID:  Yolanda Harris, DOB 23-Oct-1956, MRN [id number]  PCP:  Jodi Marble, MD  Cardiologist:  Neoma Laming, MD      History of Present Illness: Yolanda Harris is a 67 y.o. female who presents for  Chief Complaint  Patient presents with   Follow-up    Nst/echo results    Patient in office to discuss results of echo and stress test. Denies chest pain, palpitations, dizziness. Gets short of breath on exertion. Patient reports stopping amiodarone on her own 4 days ago due to increased shortness of breath, decrease in energy and "brain fog".    Past Medical History:  Diagnosis Date   Diabetes mellitus without complication (HCC)    Hypertension    Sciatic leg pain      Past Surgical History:  Procedure Laterality Date   ABDOMINAL HYSTERECTOMY     HTN       Current Outpatient Medications  Medication Sig Dispense Refill   amitriptyline (ELAVIL) 25 MG tablet Take 25 mg by mouth every morning.     amLODipine-benazepril (LOTREL) 5-20 MG capsule Take 1 capsule by mouth daily.     atorvastatin (LIPITOR) 10 MG tablet Take 10 mg by mouth daily.     cyclobenzaprine (FLEXERIL) 10 MG tablet Take 1 tablet (10 mg total) by mouth 3 (three) times daily as needed for muscle spasms. 30 tablet 0   ELIQUIS 5 MG TABS tablet Take 5 mg by mouth 2 (two) times daily.     meloxicam (MOBIC) 15 MG tablet Take 15 mg by mouth daily.     metoprolol tartrate (LOPRESSOR) 25 MG tablet Take 1 tablet by mouth twice daily 60 tablet 0   morphine (MS CONTIN) 15 MG 12 hr tablet Take 1 tablet (15 mg total) by mouth 2 (two) times daily. 60 tablet 0   pioglitazone (ACTOS) 45 MG tablet Take 45 mg by mouth daily.     pregabalin (LYRICA) 75 MG capsule Take by mouth 2 (two) times daily.     Semaglutide, 2 MG/DOSE, (OZEMPIC, 2 MG/DOSE,) 8 MG/3ML SOPN Inject 3 mLs into the skin once a week.     TRESIBA FLEXTOUCH 200 UNIT/ML FlexTouch Pen Inject 200 Units into the  skin 1 day or 1 dose.     No current facility-administered medications for this visit.    Allergies:   Amiodarone and Sulfa antibiotics    Social History:   reports that she has been smoking cigarettes. She has been smoking an average of .5 packs per day. She has never used smokeless tobacco. She reports that she does not currently use alcohol. She reports that she does not use drugs.   Family History:  family history is not on file.    ROS:     Review of Systems  Constitutional: Negative.   HENT: Negative.    Eyes: Negative.   Respiratory:  Positive for shortness of breath.   Gastrointestinal: Negative.   Genitourinary: Negative.   Musculoskeletal: Negative.   Skin: Negative.   Neurological: Negative.   Endo/Heme/Allergies: Negative.   Psychiatric/Behavioral: Negative.    All other systems reviewed and are negative.   All other systems are reviewed and negative.   PHYSICAL EXAM: VS:  BP 132/70   Pulse 79   Ht '5\' 9"'$  (1.753 m)   Wt 267 lb (121.1 kg)   SpO2 92%   BMI 39.43 kg/m  , BMI Body mass index is  39.43 kg/m. Last weight:  Wt Readings from Last 3 Encounters:  01/23/23 267 lb (121.1 kg)  01/03/23 265 lb (120.2 kg)  03/07/22 230 lb (104.3 kg)     Physical Exam Constitutional:      Appearance: Normal appearance.  Cardiovascular:     Rate and Rhythm: Normal rate and regular rhythm.     Heart sounds: Normal heart sounds.  Pulmonary:     Effort: Pulmonary effort is normal.     Breath sounds: Normal breath sounds.  Musculoskeletal:     Right lower leg: No edema.     Left lower leg: No edema.  Neurological:     Mental Status: She is alert.     EKG: none today  Recent Labs: 12/14/2022: ALT 16; BUN 10; Creatinine, Ser 0.63; Hemoglobin 13.8; Platelets 91; Potassium 4.2; Sodium 142; TSH 4.630    Lipid Panel    Component Value Date/Time   CHOL 119 12/14/2022 0922   TRIG 148 12/14/2022 0922   HDL 50 12/14/2022 0922   LDLCALC 44 12/14/2022 0922     Other studies Reviewed: echo, stress test   ASSESSMENT AND PLAN:    ICD-10-CM   1. SOB (shortness of breath)  99991111 Basic metabolic panel    CT CORONARY MORPH W/CTA COR W/SCORE W/CA W/CM &/OR WO/CM    2. Atrial fibrillation, currently in sinus rhythm  [id number] Basic metabolic panel    CT CORONARY MORPH W/CTA COR W/SCORE W/CA W/CM &/OR WO/CM    3. Diabetes mellitus without complication (HCC)  [id number]     4. Abnormal nuclear stress test  R94.39        Problem List Items Addressed This Visit       Endocrine   Diabetes mellitus without complication ([place])     Other   Atrial fibrillation, currently in sinus rhythm   Relevant Orders   Basic metabolic panel   CT CORONARY MORPH W/CTA COR W/SCORE W/CA W/CM &/OR WO/CM   SOB (shortness of breath) - Primary    Echo 01/12/23 normal EF, grade I DD.      Relevant Orders   Basic metabolic panel   CT CORONARY MORPH W/CTA COR W/SCORE W/CA W/CM &/OR WO/CM   Abnormal nuclear stress test    Patient denies chest pain. Stress test positive for reversible defect in inferior wall. CCTA ordered.           Disposition:   Return in about 4 weeks (around 02/20/2023) for after CCTA.    Total time spent: 30 minutes  Signed,  Neoma Laming, MD  01/23/2023 1:47 PM    [facility]

## 2023-01-23 NOTE — Assessment & Plan Note (Signed)
Patient denies chest pain. Stress test positive for reversible defect in inferior wall. CCTA ordered.

## 2023-01-23 NOTE — Assessment & Plan Note (Signed)
Echo 01/12/23 normal EF, grade I DD.

## 2023-01-24 LAB — BASIC METABOLIC PANEL
BUN/Creatinine Ratio: 14 (ref 12–28)
BUN: 9 mg/dL (ref 8–27)
CO2: 23 mmol/L (ref 20–29)
Calcium: 8.5 mg/dL — ABNORMAL LOW (ref 8.7–10.3)
Chloride: 104 mmol/L (ref 96–106)
Creatinine, Ser: 0.66 mg/dL (ref 0.57–1.00)
Glucose: 116 mg/dL — ABNORMAL HIGH (ref 70–99)
Potassium: 4.4 mmol/L (ref 3.5–5.2)
Sodium: 140 mmol/L (ref 134–144)
eGFR: 95 mL/min/{1.73_m2} (ref >59)

## 2023-01-24 NOTE — Progress Notes (Signed)
Closing encounter for provider.

## 2023-01-28 ENCOUNTER — Other Ambulatory Visit: Payer: Self-pay | Admitting: Internal Medicine

## 2023-01-31 DIAGNOSIS — S91112A Laceration without foreign body of left great toe without damage to nail, initial encounter: Secondary | ICD-10-CM | POA: Diagnosis not present

## 2023-01-31 DIAGNOSIS — W268XXA Contact with other sharp object(s), not elsewhere classified, initial encounter: Secondary | ICD-10-CM | POA: Diagnosis not present

## 2023-02-01 ENCOUNTER — Other Ambulatory Visit: Payer: Self-pay | Admitting: Internal Medicine

## 2023-02-01 MED ORDER — MORPHINE SULFATE ER 15 MG PO TBCR: 15.0000 mg | EXTENDED_RELEASE_TABLET | Freq: Two times a day (BID) | ORAL | 0 refills | Status: DC

## 2023-02-03 ENCOUNTER — Ambulatory Visit: Payer: Medicare HMO | Admitting: Internal Medicine

## 2023-02-13 ENCOUNTER — Encounter: Payer: Self-pay | Admitting: Internal Medicine

## 2023-02-13 ENCOUNTER — Ambulatory Visit (INDEPENDENT_AMBULATORY_CARE_PROVIDER_SITE_OTHER): Payer: Medicare HMO | Admitting: Internal Medicine

## 2023-02-13 VITALS — BP 128/78 | HR 74 | Temp 98.8°F | Ht 69.0 in | Wt 271.0 lb

## 2023-02-13 DIAGNOSIS — R600 Localized edema: Secondary | ICD-10-CM

## 2023-02-13 DIAGNOSIS — E119 Type 2 diabetes mellitus without complications: Secondary | ICD-10-CM | POA: Diagnosis not present

## 2023-02-13 DIAGNOSIS — Z6841 Body Mass Index (BMI) 40.0 and over, adult: Secondary | ICD-10-CM | POA: Diagnosis not present

## 2023-02-13 DIAGNOSIS — S80822A Blister (nonthermal), left lower leg, initial encounter: Secondary | ICD-10-CM | POA: Diagnosis not present

## 2023-02-13 DIAGNOSIS — S81802A Unspecified open wound, left lower leg, initial encounter: Secondary | ICD-10-CM

## 2023-02-13 MED ORDER — ZINC OXIDE 15 % EX CREA: TOPICAL_CREAM | CUTANEOUS | 0 refills | Status: DC

## 2023-02-13 MED ORDER — FUROSEMIDE 40 MG PO TABS: 40.0000 mg | ORAL_TABLET | Freq: Every day | ORAL | 11 refills | Status: DC

## 2023-02-13 NOTE — Progress Notes (Signed)
Established Patient Office Visit  Subjective:  Patient ID: Yolanda Harris, female    DOB: Apr 28, 1956  Age: 67 y.o. MRN: [id number]  Chief Complaint  Patient presents with   Acute Visit    Blister on leg    C/o blisters of both legs. Left leg deroofed causing  an ulcer.    No other concerns at this time.   Past Medical History:  Diagnosis Date   Diabetes mellitus without complication (HCC)    Hypertension    Sciatic leg pain     Past Surgical History:  Procedure Laterality Date   ABDOMINAL HYSTERECTOMY     HTN      Social History   Socioeconomic History   Marital status: Married    Spouse name: Not on file   Number of children: Not on file   Years of education: Not on file   Highest education level: Not on file  Occupational History   Not on file  Tobacco Use   Smoking status: Every Day    Packs/day: .5    Types: Cigarettes   Smokeless tobacco: Never  Vaping Use   Vaping Use: Never used  Substance and Sexual Activity   Alcohol use: Not Currently   Drug use: Never   Sexual activity: Not on file  Other Topics Concern   Not on file  Social History Narrative   Not on file   Social Determinants of Health   Financial Resource Strain: Not on file  Food Insecurity: Not on file  Transportation Needs: Not on file  Physical Activity: Not on file  Stress: Not on file  Social Connections: Not on file  Intimate Partner Violence: Not on file    Family History  Problem Relation Age of Onset   Breast cancer Neg Hx     Allergies  Allergen Reactions   Amiodarone Shortness Of Breath   Sulfa Antibiotics Rash    Review of Systems  Constitutional: Negative.        Objective:   BP 128/78   Pulse 74   Temp 98.8 F (37.1 C) (Tympanic)   Ht 5\' 9"  (1.753 m)   Wt 271 lb (122.9 kg)   SpO2 92%   BMI 40.02 kg/m   Vitals:   02/13/23 1529  BP: 128/78  Pulse: 74  Temp: 98.8 F (37.1 C)  Height: 5\' 9"  (1.753 m)  Weight: 271 lb (122.9 kg)  SpO2:  92%  TempSrc: Tympanic  BMI (Calculated): 40    Physical Exam Vitals reviewed.  Constitutional:      General: She is not in acute distress.    Appearance: She is obese.  HENT:     Head: Normocephalic.     Nose: Nose normal.     Mouth/Throat:     Mouth: Mucous membranes are moist.  Eyes:     Extraocular Movements: Extraocular movements intact.     Pupils: Pupils are equal, round, and reactive to light.  Cardiovascular:     Rate and Rhythm: Normal rate and regular rhythm.     Heart sounds: No murmur heard. Pulmonary:     Effort: Pulmonary effort is normal.     Breath sounds: No rhonchi or rales.  Abdominal:     General: Abdomen is flat.     Palpations: There is no hepatomegaly, splenomegaly or mass.  Musculoskeletal:        General: Normal range of motion.     Cervical back: Normal range of motion. No tenderness.  Right lower leg: Edema present.     Left lower leg: Edema present.  Skin:    General: Skin is warm and dry.     Findings: Wound present.     Comments: LLE, superficial clean base with granulating tissue  Neurological:     General: No focal deficit present.     Mental Status: She is alert and oriented to person, place, and time.     Cranial Nerves: No cranial nerve deficit.     Motor: No weakness.  Psychiatric:        Mood and Affect: Mood normal.        Behavior: Behavior normal.      No results found for any visits on 02/13/23.  Recent Results (from the past 2160 hour(Devita Nies))  CBC with Differential/Platelet     Status: Abnormal   Collection Time: 12/14/22  9:22 AM  Result Value Ref Range   WBC 6.8 3.4 - 10.8 x10E3/uL   RBC 4.41 3.77 - 5.28 x10E6/uL   Hemoglobin 13.8 11.1 - 15.9 g/dL   Hematocrit 40.3 34.0 - 46.6 %   MCV 91 79 - 97 fL   MCH 31.3 26.6 - 33.0 pg   MCHC 34.2 31.5 - 35.7 g/dL   RDW 15.9 (H) 11.7 - 15.4 %   Platelets 91 (LL) 150 - 450 x10E3/uL    Comment: Actual platelet count may be somewhat higher than reported due to aggregation of  platelets in this sample.    Neutrophils 38 Not Estab. %   Lymphs 44 Not Estab. %   Monocytes 9 Not Estab. %   Eos 8 Not Estab. %   Basos 1 Not Estab. %   Neutrophils Absolute 2.6 1.4 - 7.0 x10E3/uL   Lymphocytes Absolute 3.0 0.7 - 3.1 x10E3/uL   Monocytes Absolute 0.6 0.1 - 0.9 x10E3/uL   EOS (ABSOLUTE) 0.5 (H) 0.0 - 0.4 x10E3/uL   Basophils Absolute 0.1 0.0 - 0.2 x10E3/uL   Immature Granulocytes 0 Not Estab. %   Immature Grans (Abs) 0.0 0.0 - 0.1 x10E3/uL   Hematology Comments: Note:     Comment: Verified by microscopic examination.  Comprehensive metabolic panel     Status: Abnormal   Collection Time: 12/14/22  9:22 AM  Result Value Ref Range   Glucose 145 (H) 70 - 99 mg/dL   BUN 10 8 - 27 mg/dL   Creatinine, Ser 0.63 0.57 - 1.00 mg/dL   eGFR 97 >59 mL/min/1.73   BUN/Creatinine Ratio 16 12 - 28   Sodium 142 134 - 144 mmol/L   Potassium 4.2 3.5 - 5.2 mmol/L   Chloride 105 96 - 106 mmol/L   CO2 24 20 - 29 mmol/L   Calcium 8.8 8.7 - 10.3 mg/dL   Total Protein 6.4 6.0 - 8.5 g/dL   Albumin 3.4 (L) 3.9 - 4.9 g/dL   Globulin, Total 3.0 1.5 - 4.5 g/dL   Albumin/Globulin Ratio 1.1 (L) 1.2 - 2.2   Bilirubin Total 0.7 0.0 - 1.2 mg/dL   Alkaline Phosphatase 141 (H) 44 - 121 IU/L   AST 26 0 - 40 IU/L   ALT 16 0 - 32 IU/L  Drug Screen 13 with reflex Confirmation (AMP,BAR,BZO,COC,PCP,THC,OPI,OXY,MD,FEN,MEP,PPX,TRAM), Serum     Status: Abnormal   Collection Time: 12/14/22  9:22 AM  Result Value Ref Range   Amphetamines, IA Negative Cutoff:50 ng/mL   Barbiturates, IA Negative Cutoff:0.1 ug/mL   Benzodiazepines, IA Negative Cutoff:20 ng/mL   Cocaine & Metabolite, IA Negative Cutoff:25 ng/mL  Phencyclidine, IA Negative Cutoff:8 ng/mL   THC(Marijuana) Metabolite, IA Negative Cutoff:5 ng/mL   Opiates, IA ++POSITIVE++ (A) Cutoff:5 ng/mL   Oxycodones, IA Negative Cutoff:5 ng/mL    Comment: Presumptive immunoassay result indicated need for further testing; definitive confirmation was  negative.    Methadone, IA Negative Cutoff:25 ng/mL   FENTANYL, IA Negative Cutoff:1.0 ng/mL   Propoxyphene, IA Negative Cutoff:50 ng/mL   MEPERIDINE, IA Negative Cutoff:100 ng/mL   TRAMADOL, IA Negative Cutoff:50 ng/mL    Comment: This test was developed and its performance characteristics determined by [facility].  It has not been cleared or approved by the Food and Drug Administration.   Lipid Panel w/o Chol/HDL Ratio     Status: None   Collection Time: 12/14/22  9:22 AM  Result Value Ref Range   Cholesterol, Total 119 100 - 199 mg/dL   Triglycerides 148 0 - 149 mg/dL   HDL 50 >39 mg/dL   VLDL Cholesterol Cal 25 5 - 40 mg/dL   LDL Chol Calc (NIH) 44 0 - 99 mg/dL  Hgb A1c w/o eAG     Status: Abnormal   Collection Time: 12/14/22  9:22 AM  Result Value Ref Range   Hgb A1c MFr Bld 6.6 (H) 4.8 - 5.6 %    Comment:          Prediabetes: 5.7 - 6.4          Diabetes: >6.4          Glycemic control for adults with diabetes: <7.0   TSH     Status: Abnormal   Collection Time: 12/14/22  9:22 AM  Result Value Ref Range   TSH 4.630 (H) 0.450 - 4.500 uIU/mL  Opiates,MS,WB/Sp Rfx     Status: None   Collection Time: 12/14/22  9:22 AM  Result Value Ref Range   Opiate Confirmation Positive    Codeine Negative ng/mL   Morphine 21.6 ng/mL   6-Acetylmorphine Negative    Hydrocodone Negative ng/mL   Hydromorphone Negative ng/mL   Dihydrocodeine Negative ng/mL    Comment: Expected metabolism of opiate class drugs:   Parent Drug       Detected Metabolites  -----------       --------------------  Codeine:          Major:  Morphine                    Minor:  Hydrocodone, Hydromorphone,                            Dihydrocodeine  Morphine:         Minor:  Hydromorphone  Hydrocodone:      Hydromorphone, Dihydrocodeine  Hydromorphone:    None  Dihydrocodeine:   None  Heroin:           6-Acetylmorphine                    Morphine                    Codeine, in small amounts in comparison                      to morphine, is often detected when                     heroin is the source drug. Confirmation threshold: 1.0 ng/mL   Oxycodones,MS,WB/Sp Rfx  Status: None   Collection Time: 12/14/22  9:22 AM  Result Value Ref Range   Oxycodones Confirmation Negative    Oxycocone Negative ng/mL   Oxymorphone Negative ng/mL    Comment: Confirmation threshold: 1.0 ng/mL  Basic metabolic panel     Status: Abnormal   Collection Time: 01/23/23  1:42 PM  Result Value Ref Range   Glucose 116 (H) 70 - 99 mg/dL   BUN 9 8 - 27 mg/dL   Creatinine, Ser 0.66 0.57 - 1.00 mg/dL   eGFR 95 >59 mL/min/1.73   BUN/Creatinine Ratio 14 12 - 28   Sodium 140 134 - 144 mmol/L   Potassium 4.4 3.5 - 5.2 mmol/L   Chloride 104 96 - 106 mmol/L   CO2 23 20 - 29 mmol/L   Calcium 8.5 (L) 8.7 - 10.3 mg/dL      Assessment & Plan:   Problem List Items Addressed This Visit   None   No follow-ups on file.   Total time spent: 30 minutes  Volanda Napoleon, MD  02/13/2023

## 2023-02-14 ENCOUNTER — Ambulatory Visit (INDEPENDENT_AMBULATORY_CARE_PROVIDER_SITE_OTHER): Payer: Medicare HMO

## 2023-02-14 DIAGNOSIS — R0602 Shortness of breath: Secondary | ICD-10-CM

## 2023-02-14 DIAGNOSIS — Z8679 Personal history of other diseases of the circulatory system: Secondary | ICD-10-CM

## 2023-02-14 DIAGNOSIS — R9430 Abnormal result of cardiovascular function study, unspecified: Secondary | ICD-10-CM

## 2023-02-14 MED ORDER — IOHEXOL 350 MG/ML SOLN
100.0000 mL | Freq: Once | INTRAVENOUS | Status: AC | PRN
  Administered 2023-02-14: 100 mL via INTRAVENOUS

## 2023-02-15 ENCOUNTER — Ambulatory Visit: Payer: Medicare HMO | Admitting: Internal Medicine

## 2023-02-20 ENCOUNTER — Other Ambulatory Visit: Payer: Self-pay | Admitting: Internal Medicine

## 2023-02-21 ENCOUNTER — Ambulatory Visit: Payer: Medicare HMO | Admitting: Cardiovascular Disease

## 2023-02-24 ENCOUNTER — Ambulatory Visit: Payer: Medicare HMO | Admitting: Cardiovascular Disease

## 2023-02-24 ENCOUNTER — Encounter: Payer: Self-pay | Admitting: Cardiovascular Disease

## 2023-02-24 VITALS — BP 110/82 | HR 139 | Ht 69.0 in | Wt 264.6 lb

## 2023-02-24 DIAGNOSIS — Z8679 Personal history of other diseases of the circulatory system: Secondary | ICD-10-CM | POA: Diagnosis not present

## 2023-02-24 DIAGNOSIS — R0602 Shortness of breath: Secondary | ICD-10-CM | POA: Diagnosis not present

## 2023-02-24 DIAGNOSIS — R9439 Abnormal result of other cardiovascular function study: Secondary | ICD-10-CM

## 2023-02-24 NOTE — Assessment & Plan Note (Signed)
Patient CCTA score 0, normal coronaries. Patient heart rate elevated today due to GI upset, possible dehydration. Recommend slowly hydrating self, water and Gatorade. Patient to monitor HR over the weekend, call office with an update on Monday.

## 2023-02-24 NOTE — Progress Notes (Signed)
Cardiology Office Note   Date:  02/24/2023   ID:  Yolanda AmyCherry E Uriegas, DOB 29-Jan-1956, MRN [id number]  PCP:  Sherron Mondayejan-Sie, S Ahmed, MD  Cardiologist:  Adrian BlackwaterShaukat Sadrac Zeoli, MD      History of Present Illness: Yolanda Harris is a 67 y.o. female who presents for  Chief Complaint  Patient presents with   Follow-up    4 week follow up, CCTA Results.    Patient in office to discuss results of CCTA. Patient complains of GI upset at this time. Vomiting, diarrhea, nausea. States she ate a green pepper which she is allergic to.      Past Medical History:  Diagnosis Date   Diabetes mellitus without complication    Hypertension    Sciatic leg pain      Past Surgical History:  Procedure Laterality Date   ABDOMINAL HYSTERECTOMY     HTN       Current Outpatient Medications  Medication Sig Dispense Refill   amitriptyline (ELAVIL) 25 MG tablet Take 25 mg by mouth every morning.     amLODipine-benazepril (LOTREL) 5-20 MG capsule Take 1 capsule by mouth daily.     apixaban (ELIQUIS) 5 MG TABS tablet Take 1 tablet (5 mg total) by mouth 2 (two) times daily. 180 tablet 1   atorvastatin (LIPITOR) 10 MG tablet Take 10 mg by mouth daily.     cyclobenzaprine (FLEXERIL) 10 MG tablet Take 1 tablet (10 mg total) by mouth 3 (three) times daily as needed for muscle spasms. 30 tablet 0   furosemide (LASIX) 40 MG tablet Take 1 tablet (40 mg total) by mouth daily. 30 tablet 11   metoprolol tartrate (LOPRESSOR) 25 MG tablet Take 1 tablet by mouth twice daily 60 tablet 0   morphine (MS CONTIN) 15 MG 12 hr tablet Take 1 tablet (15 mg total) by mouth 2 (two) times daily. 60 tablet 0   pioglitazone (ACTOS) 45 MG tablet Take 45 mg by mouth daily.     pregabalin (LYRICA) 75 MG capsule Take by mouth 2 (two) times daily.     Semaglutide, 2 MG/DOSE, (OZEMPIC, 2 MG/DOSE,) 8 MG/3ML SOPN Inject 3 mLs into the skin once a week.     TRESIBA FLEXTOUCH 200 UNIT/ML FlexTouch Pen Inject 200 Units into the skin 1 day or 1  dose.     Zinc Oxide 15 % CREA Apply to left leg wound bid 99 g 0   No current facility-administered medications for this visit.    Allergies:   Amiodarone and Sulfa antibiotics    Social History:   reports that she has been smoking cigarettes. She has been smoking an average of .5 packs per day. She has never used smokeless tobacco. She reports that she does not currently use alcohol. She reports that she does not use drugs.   Family History:  family history is not on file.    ROS:     Review of Systems  Constitutional: Negative.   HENT: Negative.    Eyes: Negative.   Respiratory: Negative.    Cardiovascular: Negative.   Gastrointestinal:  Positive for abdominal pain, diarrhea, nausea and vomiting.  Genitourinary: Negative.   Musculoskeletal: Negative.   Skin: Negative.   Neurological: Negative.   Endo/Heme/Allergies: Negative.   Psychiatric/Behavioral: Negative.    All other systems reviewed and are negative.   All other systems are reviewed and negative.   PHYSICAL EXAM: VS:  BP 110/82   Pulse (!) 139   Ht 5\' 9"  (1.753  m)   Wt 264 lb 9.6 oz (120 kg)   SpO2 95%   BMI 39.07 kg/m  , BMI Body mass index is 39.07 kg/m. Last weight:  Wt Readings from Last 3 Encounters:  02/24/23 264 lb 9.6 oz (120 kg)  02/13/23 271 lb (122.9 kg)  01/23/23 267 lb (121.1 kg)   Physical Exam Constitutional:      Appearance: Normal appearance.  Cardiovascular:     Rate and Rhythm: Normal rate and regular rhythm.     Heart sounds: Normal heart sounds.  Pulmonary:     Effort: Pulmonary effort is normal.     Breath sounds: Normal breath sounds.  Musculoskeletal:     Right lower leg: No edema.     Left lower leg: No edema.  Neurological:     Mental Status: She is alert.     EKG: none today  Recent Labs: 12/14/2022: ALT 16; Hemoglobin 13.8; Platelets 91; TSH 4.630 01/23/2023: BUN 9; Creatinine, Ser 0.66; Potassium 4.4; Sodium 140    Lipid Panel    Component Value Date/Time    CHOL 119 12/14/2022 0922   TRIG 148 12/14/2022 0922   HDL 50 12/14/2022 0922   LDLCALC 44 12/14/2022 9480     Other studies Reviewed: CCTA   ASSESSMENT AND PLAN:    ICD-10-CM   1. Abnormal nuclear stress test  R94.39     2. Atrial fibrillation, currently in sinus rhythm  Z86.79     3. SOB (shortness of breath)  R06.02        Problem List Items Addressed This Visit       Other   Atrial fibrillation, currently in sinus rhythm   SOB (shortness of breath)   Abnormal nuclear stress test - Primary    Patient CCTA score 0, normal coronaries. Patient heart rate elevated today due to GI upset, possible dehydration. Recommend slowly hydrating self, water and Gatorade. Patient to monitor HR over the weekend, call office with an update on Monday.        Disposition:   Return in about 4 weeks (around 03/24/2023).    Total time spent: 30 minutes  Signed,  Adrian Blackwater, MD  02/24/2023 3:17 PM    [facility]

## 2023-03-15 ENCOUNTER — Telehealth: Payer: Self-pay | Admitting: Internal Medicine

## 2023-03-15 ENCOUNTER — Other Ambulatory Visit: Payer: Self-pay | Admitting: Internal Medicine

## 2023-03-15 NOTE — Telephone Encounter (Signed)
Patient requesting refills on her Lyrica, morphine, and amlodipine-benazepril. Please send to [facility].

## 2023-03-17 ENCOUNTER — Other Ambulatory Visit: Payer: Self-pay | Admitting: Internal Medicine

## 2023-03-17 MED ORDER — MORPHINE SULFATE ER 15 MG PO TBCR: 15.0000 mg | EXTENDED_RELEASE_TABLET | Freq: Two times a day (BID) | ORAL | 0 refills | Status: DC

## 2023-03-27 ENCOUNTER — Ambulatory Visit: Payer: Medicare HMO | Admitting: Cardiovascular Disease

## 2023-03-30 ENCOUNTER — Other Ambulatory Visit: Payer: Self-pay | Admitting: Nurse Practitioner

## 2023-04-02 ENCOUNTER — Other Ambulatory Visit: Payer: Self-pay | Admitting: Internal Medicine

## 2023-04-05 ENCOUNTER — Encounter: Payer: Self-pay | Admitting: Internal Medicine

## 2023-04-05 ENCOUNTER — Ambulatory Visit (INDEPENDENT_AMBULATORY_CARE_PROVIDER_SITE_OTHER): Payer: Medicare HMO | Admitting: Internal Medicine

## 2023-04-05 VITALS — BP 128/84 | HR 87 | Ht 69.0 in | Wt 262.2 lb

## 2023-04-05 DIAGNOSIS — E782 Mixed hyperlipidemia: Secondary | ICD-10-CM

## 2023-04-05 DIAGNOSIS — E119 Type 2 diabetes mellitus without complications: Secondary | ICD-10-CM

## 2023-04-05 DIAGNOSIS — G894 Chronic pain syndrome: Secondary | ICD-10-CM

## 2023-04-05 MED ORDER — TRESIBA FLEXTOUCH 200 UNIT/ML ~~LOC~~ SOPN
96.0000 [IU] | PEN_INJECTOR | Freq: Every day | SUBCUTANEOUS | 0 refills | Status: DC
Start: 2023-04-05 — End: 2023-10-18

## 2023-04-05 MED ORDER — ATORVASTATIN CALCIUM 10 MG PO TABS: 10.0000 mg | ORAL_TABLET | Freq: Every day | ORAL | 0 refills | Status: DC

## 2023-04-05 MED ORDER — OZEMPIC (2 MG/DOSE) 8 MG/3ML ~~LOC~~ SOPN
3.0000 mL | PEN_INJECTOR | SUBCUTANEOUS | 2 refills | Status: AC
Start: 2023-04-05 — End: 2023-07-04

## 2023-04-05 MED ORDER — MORPHINE SULFATE ER 15 MG PO TBCR
15.0000 mg | EXTENDED_RELEASE_TABLET | Freq: Two times a day (BID) | ORAL | 0 refills | Status: DC
Start: 2023-04-05 — End: 2023-05-05

## 2023-04-05 NOTE — Progress Notes (Signed)
Established Patient Office Visit  Subjective:  Patient ID: Yolanda Harris, female    DOB: November 19, 1956  Age: 67 y.o. MRN: [id number]  Chief Complaint  Patient presents with   Follow-up    Pain Management    Here for pain management follow up. Chronic pain well controlled on current analgesia. Last UDS satisfactory and pill counts have also been satisfactory.     No other concerns at this time.   Past Medical History:  Diagnosis Date   Diabetes mellitus without complication (HCC)    Hypertension    Sciatic leg pain     Past Surgical History:  Procedure Laterality Date   ABDOMINAL HYSTERECTOMY     HTN      Social History   Socioeconomic History   Marital status: Married    Spouse name: Not on file   Number of children: Not on file   Years of education: Not on file   Highest education level: Not on file  Occupational History   Not on file  Tobacco Use   Smoking status: Every Day    Packs/day: .5    Types: Cigarettes   Smokeless tobacco: Never  Vaping Use   Vaping Use: Never used  Substance and Sexual Activity   Alcohol use: Not Currently   Drug use: Never   Sexual activity: Not on file  Other Topics Concern   Not on file  Social History Narrative   Not on file   Social Determinants of Health   Financial Resource Strain: Not on file  Food Insecurity: Not on file  Transportation Needs: Not on file  Physical Activity: Not on file  Stress: Not on file  Social Connections: Not on file  Intimate Partner Violence: Not on file    Family History  Problem Relation Age of Onset   Breast cancer Neg Hx     Allergies  Allergen Reactions   Amiodarone Shortness Of Breath   Sulfa Antibiotics Rash    Review of Systems  Constitutional: Negative.        Objective:   BP 128/84   Pulse 87   Ht 5\' 9"  (1.753 m)   Wt 262 lb 3.2 oz (118.9 kg)   SpO2 93%   BMI 38.72 kg/m   Vitals:   04/05/23 1020  BP: 128/84  Pulse: 87  Height: 5\' 9"  (1.753 m)   Weight: 262 lb 3.2 oz (118.9 kg)  SpO2: 93%  BMI (Calculated): 38.7    Physical Exam Vitals reviewed.  Constitutional:      General: She is not in acute distress.    Appearance: She is obese.  HENT:     Head: Normocephalic.     Nose: Nose normal.     Mouth/Throat:     Mouth: Mucous membranes are moist.  Eyes:     Extraocular Movements: Extraocular movements intact.     Pupils: Pupils are equal, round, and reactive to light.  Cardiovascular:     Rate and Rhythm: Normal rate and regular rhythm.     Heart sounds: No murmur heard. Pulmonary:     Effort: Pulmonary effort is normal.     Breath sounds: No rhonchi or rales.  Abdominal:     General: Abdomen is flat.     Palpations: There is no hepatomegaly, splenomegaly or mass.  Musculoskeletal:        General: Normal range of motion.     Cervical back: Normal range of motion. No tenderness.     Right lower  leg: Edema present.     Left lower leg: Edema present.  Skin:    General: Skin is warm and dry.     Findings: Wound present.     Comments: LLE, superficial clean base with granulating tissue  Neurological:     General: No focal deficit present.     Mental Status: She is alert and oriented to person, place, and time.     Cranial Nerves: No cranial nerve deficit.     Motor: No weakness.  Psychiatric:        Mood and Affect: Mood normal.        Behavior: Behavior normal.      No results found for any visits on 04/05/23.  Recent Results (from the past 2160 hour(Yolanda Harris))  Basic metabolic panel     Status: Abnormal   Collection Time: 01/23/23  1:42 PM  Result Value Ref Range   Glucose 116 (H) 70 - 99 mg/dL   BUN 9 8 - 27 mg/dL   Creatinine, Ser 5.78 0.57 - 1.00 mg/dL   eGFR 95 >46 NG/EXB/2.84   BUN/Creatinine Ratio 14 12 - 28   Sodium 140 134 - 144 mmol/L   Potassium 4.4 3.5 - 5.2 mmol/L   Chloride 104 96 - 106 mmol/L   CO2 23 20 - 29 mmol/L   Calcium 8.5 (L) 8.7 - 10.3 mg/dL      Assessment & Plan:   Problem  List Items Addressed This Visit       Endocrine   Diabetes mellitus without complication (HCC) - Primary   Relevant Medications   atorvastatin (LIPITOR) 10 MG tablet   insulin degludec (TRESIBA FLEXTOUCH) 200 UNIT/ML FlexTouch Pen   Semaglutide, 2 MG/DOSE, (OZEMPIC, 2 MG/DOSE,) 8 MG/3ML SOPN   Other Relevant Orders   Hemoglobin A1c   Other Visit Diagnoses     Chronic pain syndrome       Relevant Medications   morphine (MS CONTIN) 15 MG 12 hr tablet   Mixed hyperlipidemia       Relevant Medications   atorvastatin (LIPITOR) 10 MG tablet   Other Relevant Orders   Lipid panel   Comprehensive metabolic panel       Return in about 2 weeks (around 04/19/2023) for lab results.   Total time spent: 30 minutes  Yolanda Fuse, MD  04/05/2023   This document may have been prepared by [facility] Voice Recognition software and as such may include unintentional dictation errors.

## 2023-04-06 ENCOUNTER — Encounter: Payer: Self-pay | Admitting: Cardiovascular Disease

## 2023-04-06 ENCOUNTER — Ambulatory Visit: Payer: Medicare HMO | Admitting: Cardiovascular Disease

## 2023-04-06 VITALS — BP 132/80 | HR 77 | Ht 69.0 in | Wt 259.0 lb

## 2023-04-06 DIAGNOSIS — Z8679 Personal history of other diseases of the circulatory system: Secondary | ICD-10-CM | POA: Diagnosis not present

## 2023-04-06 DIAGNOSIS — E782 Mixed hyperlipidemia: Secondary | ICD-10-CM | POA: Diagnosis not present

## 2023-04-06 DIAGNOSIS — I10 Essential (primary) hypertension: Secondary | ICD-10-CM | POA: Diagnosis not present

## 2023-04-06 NOTE — Assessment & Plan Note (Signed)
In sinus rhythm on auscultation, continue metoprolol and Eliquis. Patient reports changing coffee to decaf, experiencing less palpitations.

## 2023-04-06 NOTE — Assessment & Plan Note (Signed)
11/2022 LDL 44, continue atorvastatin 10 mg daily.

## 2023-04-06 NOTE — Assessment & Plan Note (Signed)
Controlled today. Continue same medications. 

## 2023-04-06 NOTE — Progress Notes (Signed)
Cardiology Office Note   Date:  04/06/2023   ID:  Yolanda Harris, DOB Dec 18, 1955, MRN [id number]  PCP:  Sherron Monday, MD  Cardiologist:  Adrian Blackwater, MD      History of Present Illness: Yolanda Harris is a 68 y.o. female who presents for  Chief Complaint  Patient presents with   Follow-up    1 month follow up    Patient in office for one month follow up. Patient denies chest pain, shortness of breath, dizziness.      Past Medical History:  Diagnosis Date   Diabetes mellitus without complication (HCC)    Hypertension    Sciatic leg pain      Past Surgical History:  Procedure Laterality Date   ABDOMINAL HYSTERECTOMY     HTN       Current Outpatient Medications  Medication Sig Dispense Refill   amitriptyline (ELAVIL) 25 MG tablet Take 25 mg by mouth every morning.     amLODipine-benazepril (LOTREL) 5-20 MG capsule Take 1 capsule by mouth daily.     apixaban (ELIQUIS) 5 MG TABS tablet Take 1 tablet (5 mg total) by mouth 2 (two) times daily. 180 tablet 1   atorvastatin (LIPITOR) 10 MG tablet Take 1 tablet (10 mg total) by mouth daily. 90 tablet 0   cyclobenzaprine (FLEXERIL) 10 MG tablet TAKE 1 TABLET BY MOUTH THREE TIMES DAILY 270 tablet 0   furosemide (LASIX) 40 MG tablet Take 1 tablet (40 mg total) by mouth daily. 30 tablet 11   insulin degludec (TRESIBA FLEXTOUCH) 200 UNIT/ML FlexTouch Pen Inject 96 Units into the skin daily. 45 mL 0   meloxicam (MOBIC) 15 MG tablet TAKE 1 TABLET EVERY MORNING 90 tablet 1   metoprolol tartrate (LOPRESSOR) 25 MG tablet Take 1 tablet by mouth twice daily 60 tablet 3   morphine (MS CONTIN) 15 MG 12 hr tablet Take 1 tablet (15 mg total) by mouth 2 (two) times daily. 60 tablet 0   pioglitazone (ACTOS) 45 MG tablet Take 45 mg by mouth daily.     pregabalin (LYRICA) 300 MG capsule Take 1 capsule by mouth twice daily 60 capsule 2   Semaglutide, 2 MG/DOSE, (OZEMPIC, 2 MG/DOSE,) 8 MG/3ML SOPN Inject 3 mLs into the skin once  a week. 3 mL 2   Zinc Oxide 15 % CREA Apply to left leg wound bid 99 g 0   No current facility-administered medications for this visit.    Allergies:   Amiodarone and Sulfa antibiotics    Social History:   reports that she has been smoking cigarettes. She has been smoking an average of .5 packs per day. She has never used smokeless tobacco. She reports that she does not currently use alcohol. She reports that she does not use drugs.   Family History:  family history is not on file.    ROS:     Review of Systems  Constitutional: Negative.   HENT: Negative.    Eyes: Negative.   Respiratory: Negative.    Cardiovascular: Negative.   Gastrointestinal: Negative.   Genitourinary: Negative.   Musculoskeletal: Negative.   Skin: Negative.   Neurological: Negative.   Endo/Heme/Allergies: Negative.   Psychiatric/Behavioral: Negative.    All other systems reviewed and are negative.    All other systems are reviewed and negative.    PHYSICAL EXAM: VS:  BP 132/80   Pulse 77   Ht 5\' 9"  (1.753 m)   Wt 259 lb (117.5 kg)  SpO2 92%   BMI 38.25 kg/m  , BMI Body mass index is 38.25 kg/m. Last weight:  Wt Readings from Last 3 Encounters:  04/06/23 259 lb (117.5 kg)  04/05/23 262 lb 3.2 oz (118.9 kg)  02/24/23 264 lb 9.6 oz (120 kg)    Physical Exam Constitutional:      Appearance: Normal appearance.  Cardiovascular:     Rate and Rhythm: Normal rate and regular rhythm.     Heart sounds: Normal heart sounds.  Pulmonary:     Effort: Pulmonary effort is normal.     Breath sounds: Normal breath sounds.  Musculoskeletal:     Right lower leg: No edema.     Left lower leg: No edema.  Neurological:     Mental Status: She is alert.     EKG: none today  Recent Labs: 12/14/2022: ALT 16; Hemoglobin 13.8; Platelets 91; TSH 4.630 01/23/2023: BUN 9; Creatinine, Ser 0.66; Potassium 4.4; Sodium 140    Lipid Panel    Component Value Date/Time   CHOL 119 12/14/2022 0922   TRIG 148  12/14/2022 0922   HDL 50 12/14/2022 0922   LDLCALC 44 12/14/2022 0922     ASSESSMENT AND PLAN:    ICD-10-CM   1. Atrial fibrillation, currently in sinus rhythm  Z86.79     2. Mixed hyperlipidemia  E78.2     3. Primary hypertension  I10        Problem List Items Addressed This Visit       Cardiovascular and Mediastinum   Primary hypertension    Controlled today. Continue same medications.         Other   Atrial fibrillation, currently in sinus rhythm - Primary    In sinus rhythm on auscultation, continue metoprolol and Eliquis. Patient reports changing coffee to decaf, experiencing less palpitations.       Mixed hyperlipidemia    11/2022 LDL 44, continue atorvastatin 10 mg daily.       Disposition:   Return in about 4 months (around 08/07/2023).    Total time spent: 30 minutes  Signed,  Adrian Blackwater, MD  04/06/2023 2:20 PM    [facility]

## 2023-04-10 ENCOUNTER — Other Ambulatory Visit: Payer: Self-pay | Admitting: Internal Medicine

## 2023-04-10 MED ORDER — CELECOXIB 400 MG PO CAPS: 400.0000 mg | ORAL_CAPSULE | Freq: Every day | ORAL | 5 refills | Status: DC

## 2023-05-02 ENCOUNTER — Other Ambulatory Visit: Payer: Self-pay | Admitting: Internal Medicine

## 2023-05-02 ENCOUNTER — Other Ambulatory Visit: Payer: Medicare HMO

## 2023-05-02 DIAGNOSIS — I10 Essential (primary) hypertension: Secondary | ICD-10-CM | POA: Diagnosis not present

## 2023-05-02 DIAGNOSIS — E119 Type 2 diabetes mellitus without complications: Secondary | ICD-10-CM | POA: Diagnosis not present

## 2023-05-03 LAB — CMP14+EGFR
ALT: 16 IU/L (ref 0–32)
AST: 23 IU/L (ref 0–40)
Albumin/Globulin Ratio: 1.2
Albumin: 3.7 g/dL — ABNORMAL LOW (ref 3.9–4.9)
Alkaline Phosphatase: 163 IU/L — ABNORMAL HIGH (ref 44–121)
BUN/Creatinine Ratio: 16 (ref 12–28)
BUN: 11 mg/dL (ref 8–27)
Bilirubin Total: 0.6 mg/dL (ref 0.0–1.2)
CO2: 28 mmol/L (ref 20–29)
Calcium: 8.8 mg/dL (ref 8.7–10.3)
Chloride: 104 mmol/L (ref 96–106)
Creatinine, Ser: 0.67 mg/dL (ref 0.57–1.00)
Globulin, Total: 3.2 g/dL (ref 1.5–4.5)
Glucose: 102 mg/dL — ABNORMAL HIGH (ref 70–99)
Potassium: 4.4 mmol/L (ref 3.5–5.2)
Sodium: 143 mmol/L (ref 134–144)
Total Protein: 6.9 g/dL (ref 6.0–8.5)
eGFR: 95 mL/min/{1.73_m2} (ref >59)

## 2023-05-03 LAB — LIPID PANEL W/O CHOL/HDL RATIO
Cholesterol, Total: 161 mg/dL (ref 100–199)
HDL: 54 mg/dL (ref >39)
LDL Chol Calc (NIH): 85 mg/dL (ref 0–99)
Triglycerides: 124 mg/dL (ref 0–149)
VLDL Cholesterol Cal: 22 mg/dL (ref 5–40)

## 2023-05-03 LAB — HGB A1C W/O EAG: Hgb A1c MFr Bld: 6.5 % — ABNORMAL HIGH (ref 4.8–5.6)

## 2023-05-05 ENCOUNTER — Ambulatory Visit (INDEPENDENT_AMBULATORY_CARE_PROVIDER_SITE_OTHER): Payer: Medicare HMO | Admitting: Internal Medicine

## 2023-05-05 VITALS — BP 122/74 | HR 73 | Ht 69.0 in | Wt 261.0 lb

## 2023-05-05 DIAGNOSIS — G894 Chronic pain syndrome: Secondary | ICD-10-CM | POA: Diagnosis not present

## 2023-05-05 DIAGNOSIS — E119 Type 2 diabetes mellitus without complications: Secondary | ICD-10-CM

## 2023-05-05 DIAGNOSIS — I10 Essential (primary) hypertension: Secondary | ICD-10-CM | POA: Diagnosis not present

## 2023-05-05 DIAGNOSIS — S0990XA Unspecified injury of head, initial encounter: Secondary | ICD-10-CM

## 2023-05-05 MED ORDER — MORPHINE SULFATE ER 15 MG PO TBCR
15.0000 mg | EXTENDED_RELEASE_TABLET | Freq: Two times a day (BID) | ORAL | 0 refills | Status: DC
Start: 2023-05-05 — End: 2023-06-05

## 2023-05-05 MED ORDER — PIOGLITAZONE HCL 45 MG PO TABS
45.0000 mg | ORAL_TABLET | Freq: Every day | ORAL | 1 refills | Status: DC
Start: 2023-05-05 — End: 2023-08-08

## 2023-05-05 NOTE — Progress Notes (Signed)
Established Patient Office Visit  Subjective:  Patient ID: Yolanda Harris, female    DOB: May 21, 1956  Age: 67 y.o. MRN: [id number]  Chief Complaint  Patient presents with   Follow-up    PM    Here for pain management follow up. Chronic pain well controlled on current analgesia. Last UDS satisfactory and pill counts have also been satisfactory. Reports a fall backwards 5 days ago, mechanical fall without loc but struck her head and had a headache up till two days ago. Labs reviewed and notable for well controlled diabetes, A1c at target, lipids at target with unremarkable cmp. Denies any hypoglycemic episodes and home bg readings have been at target. Only c/o deterioration of GERD on GLP-1.     No other concerns at this time.   Past Medical History:  Diagnosis Date   Diabetes mellitus without complication (HCC)    Hypertension    Sciatic leg pain     Past Surgical History:  Procedure Laterality Date   ABDOMINAL HYSTERECTOMY     HTN      Social History   Socioeconomic History   Marital status: Married    Spouse name: Not on file   Number of children: Not on file   Years of education: Not on file   Highest education level: Not on file  Occupational History   Not on file  Tobacco Use   Smoking status: Every Day    Packs/day: .5    Types: Cigarettes   Smokeless tobacco: Never  Vaping Use   Vaping Use: Never used  Substance and Sexual Activity   Alcohol use: Not Currently   Drug use: Never   Sexual activity: Not on file  Other Topics Concern   Not on file  Social History Narrative   Not on file   Social Determinants of Health   Financial Resource Strain: Not on file  Food Insecurity: Not on file  Transportation Needs: Not on file  Physical Activity: Not on file  Stress: Not on file  Social Connections: Not on file  Intimate Partner Violence: Not on file    Family History  Problem Relation Age of Onset   Breast cancer Neg Hx     Allergies   Allergen Reactions   Amiodarone Shortness Of Breath   Sulfa Antibiotics Rash    Review of Systems  Constitutional: Negative.        Objective:   BP 122/74   Pulse 73   Ht 5\' 9"  (1.753 m)   Wt 261 lb (118.4 kg)   SpO2 93%   BMI 38.54 kg/m   Vitals:   05/05/23 1020  BP: 122/74  Pulse: 73  Height: 5\' 9"  (1.753 m)  Weight: 261 lb (118.4 kg)  SpO2: 93%  BMI (Calculated): 38.53    Physical Exam Vitals reviewed.  Constitutional:      General: She is not in acute distress.    Appearance: She is obese.  HENT:     Head: Normocephalic and atraumatic.     Nose: Nose normal.     Mouth/Throat:     Mouth: Mucous membranes are moist.  Eyes:     Extraocular Movements: Extraocular movements intact.     Pupils: Pupils are equal, round, and reactive to light.  Cardiovascular:     Rate and Rhythm: Normal rate and regular rhythm.     Heart sounds: No murmur heard. Pulmonary:     Effort: Pulmonary effort is normal.     Breath sounds: No  rhonchi or rales.  Abdominal:     General: Abdomen is flat.     Palpations: There is no hepatomegaly, splenomegaly or mass.  Musculoskeletal:        General: Normal range of motion.     Cervical back: Normal range of motion. No tenderness.     Right lower leg: Edema present.     Left lower leg: Edema present.  Skin:    General: Skin is warm and dry.     Findings: Wound present.     Comments: LLE, superficial clean base with granulating tissue  Neurological:     General: No focal deficit present.     Mental Status: She is alert and oriented to person, place, and time.     Cranial Nerves: No cranial nerve deficit.     Motor: No weakness.  Psychiatric:        Mood and Affect: Mood normal.        Behavior: Behavior normal.      No results found for any visits on 05/05/23.  Recent Results (from the past 2160 hour(Yolanda Harris))  CMP14+EGFR     Status: Abnormal   Collection Time: 05/02/23 10:15 AM  Result Value Ref Range   Glucose 102 (H) 70  - 99 mg/dL   BUN 11 8 - 27 mg/dL   Creatinine, Ser 1.61 0.57 - 1.00 mg/dL   eGFR 95 >09 UE/AVW/0.98   BUN/Creatinine Ratio 16 12 - 28   Sodium 143 134 - 144 mmol/L   Potassium 4.4 3.5 - 5.2 mmol/L   Chloride 104 96 - 106 mmol/L   CO2 28 20 - 29 mmol/L   Calcium 8.8 8.7 - 10.3 mg/dL   Total Protein 6.9 6.0 - 8.5 g/dL   Albumin 3.7 (L) 3.9 - 4.9 g/dL   Globulin, Total 3.2 1.5 - 4.5 g/dL   Albumin/Globulin Ratio 1.2    Bilirubin Total 0.6 0.0 - 1.2 mg/dL   Alkaline Phosphatase 163 (H) 44 - 121 IU/L   AST 23 0 - 40 IU/L   ALT 16 0 - 32 IU/L  Lipid Panel w/o Chol/HDL Ratio     Status: None   Collection Time: 05/02/23 10:15 AM  Result Value Ref Range   Cholesterol, Total 161 100 - 199 mg/dL   Triglycerides 119 0 - 149 mg/dL   HDL 54 >14 mg/dL   VLDL Cholesterol Cal 22 5 - 40 mg/dL   LDL Chol Calc (NIH) 85 0 - 99 mg/dL  Hgb N8G w/o eAG     Status: Abnormal   Collection Time: 05/02/23 10:15 AM  Result Value Ref Range   Hgb A1c MFr Bld 6.5 (H) 4.8 - 5.6 %    Comment:          Prediabetes: 5.7 - 6.4          Diabetes: >6.4          Glycemic control for adults with diabetes: <7.0       Assessment & Plan:   Problem List Items Addressed This Visit   None   No follow-ups on file.   Total time spent: 30 minutes  Luna Fuse, MD  05/05/2023   This document may have been prepared by [facility] Voice Recognition software and as such may include unintentional dictation errors.

## 2023-05-17 DIAGNOSIS — H40003 Preglaucoma, unspecified, bilateral: Secondary | ICD-10-CM | POA: Diagnosis not present

## 2023-05-17 DIAGNOSIS — H52223 Regular astigmatism, bilateral: Secondary | ICD-10-CM | POA: Diagnosis not present

## 2023-05-17 DIAGNOSIS — H2513 Age-related nuclear cataract, bilateral: Secondary | ICD-10-CM | POA: Diagnosis not present

## 2023-05-17 DIAGNOSIS — H47393 Other disorders of optic disc, bilateral: Secondary | ICD-10-CM | POA: Diagnosis not present

## 2023-05-17 DIAGNOSIS — H5203 Hypermetropia, bilateral: Secondary | ICD-10-CM | POA: Diagnosis not present

## 2023-05-17 DIAGNOSIS — E119 Type 2 diabetes mellitus without complications: Secondary | ICD-10-CM | POA: Diagnosis not present

## 2023-05-22 ENCOUNTER — Encounter: Payer: Self-pay | Admitting: Internal Medicine

## 2023-06-01 DIAGNOSIS — H40003 Preglaucoma, unspecified, bilateral: Secondary | ICD-10-CM | POA: Diagnosis not present

## 2023-06-05 ENCOUNTER — Ambulatory Visit (INDEPENDENT_AMBULATORY_CARE_PROVIDER_SITE_OTHER): Payer: Medicare HMO | Admitting: Internal Medicine

## 2023-06-05 VITALS — BP 112/74 | HR 76 | Ht 69.0 in | Wt 254.0 lb

## 2023-06-05 DIAGNOSIS — E119 Type 2 diabetes mellitus without complications: Secondary | ICD-10-CM | POA: Diagnosis not present

## 2023-06-05 DIAGNOSIS — I10 Essential (primary) hypertension: Secondary | ICD-10-CM

## 2023-06-05 DIAGNOSIS — G894 Chronic pain syndrome: Secondary | ICD-10-CM

## 2023-06-05 MED ORDER — ROLLATOR ULTRA-LIGHT MISC
1.0000 | Freq: Every day | 0 refills | Status: AC
Start: 2023-06-05 — End: 2023-09-12

## 2023-06-05 MED ORDER — MORPHINE SULFATE ER 15 MG PO TBCR
15.0000 mg | EXTENDED_RELEASE_TABLET | Freq: Two times a day (BID) | ORAL | 0 refills | Status: DC
Start: 2023-07-05 — End: 2023-08-04

## 2023-06-05 MED ORDER — MORPHINE SULFATE ER 15 MG PO TBCR
15.0000 mg | EXTENDED_RELEASE_TABLET | Freq: Two times a day (BID) | ORAL | 0 refills | Status: DC
Start: 2023-06-05 — End: 2023-07-05

## 2023-06-05 NOTE — Progress Notes (Signed)
Established Patient Office Visit  Subjective:  Patient ID: Yolanda Harris, female    DOB: 1956-09-22  Age: 67 y.o. MRN: [id number]  Chief Complaint  Patient presents with   Follow-up    PM    Here for pain management follow up. Chronic pain well controlled on current analgesia. Last UDS satisfactory and pill counts have also been satisfactory. C/o poor balance following a fall and requests a rollator.   No other concerns at this time.   Past Medical History:  Diagnosis Date   Diabetes mellitus without complication (HCC)    Hypertension    Sciatic leg pain     Past Surgical History:  Procedure Laterality Date   ABDOMINAL HYSTERECTOMY     HTN      Social History   Socioeconomic History   Marital status: Married    Spouse name: Not on file   Number of children: Not on file   Years of education: Not on file   Highest education level: Not on file  Occupational History   Not on file  Tobacco Use   Smoking status: Every Day    Current packs/day: 0.50    Types: Cigarettes   Smokeless tobacco: Never  Vaping Use   Vaping status: Never Used  Substance and Sexual Activity   Alcohol use: Not Currently   Drug use: Never   Sexual activity: Not on file  Other Topics Concern   Not on file  Social History Narrative   Not on file   Social Determinants of Health   Financial Resource Strain: Not on file  Food Insecurity: Not on file  Transportation Needs: Not on file  Physical Activity: Not on file  Stress: Not on file  Social Connections: Not on file  Intimate Partner Violence: Not on file    Family History  Problem Relation Age of Onset   Breast cancer Neg Hx     Allergies  Allergen Reactions   Amiodarone Shortness Of Breath   Sulfa Antibiotics Rash    Review of Systems  Constitutional: Negative.   Neurological:        Poor balance       Objective:   BP 112/74   Pulse 76   Ht 5\' 9"  (1.753 m)   Wt 254 lb (115.2 kg)   SpO2 92%   BMI 37.51  kg/m   Vitals:   06/05/23 1034  BP: 112/74  Pulse: 76  Height: 5\' 9"  (1.753 m)  Weight: 254 lb (115.2 kg)  SpO2: 92%  BMI (Calculated): 37.49    Physical Exam Vitals reviewed.  Constitutional:      General: She is not in acute distress.    Appearance: She is obese.  HENT:     Head: Normocephalic and atraumatic.     Nose: Nose normal.     Mouth/Throat:     Mouth: Mucous membranes are moist.  Eyes:     Extraocular Movements: Extraocular movements intact.     Pupils: Pupils are equal, round, and reactive to light.  Cardiovascular:     Rate and Rhythm: Normal rate and regular rhythm.     Heart sounds: No murmur heard. Pulmonary:     Effort: Pulmonary effort is normal.     Breath sounds: No rhonchi or rales.  Abdominal:     General: Abdomen is flat.     Palpations: There is no hepatomegaly, splenomegaly or mass.  Musculoskeletal:        General: Normal range of motion.  Cervical back: Normal range of motion. No tenderness.     Right lower leg: Edema present.     Left lower leg: Edema present.  Skin:    General: Skin is warm and dry.     Findings: Wound present.     Comments: LLE, superficial clean base with granulating tissue  Neurological:     General: No focal deficit present.     Mental Status: She is alert and oriented to person, place, and time.     Cranial Nerves: No cranial nerve deficit.     Motor: No weakness.  Psychiatric:        Mood and Affect: Mood normal.        Behavior: Behavior normal.      No results found for any visits on 06/05/23.  Recent Results (from the past 2160 hour(Janely Gullickson))  CMP14+EGFR     Status: Abnormal   Collection Time: 05/02/23 10:15 AM  Result Value Ref Range   Glucose 102 (H) 70 - 99 mg/dL   BUN 11 8 - 27 mg/dL   Creatinine, Ser 1.61 0.57 - 1.00 mg/dL   eGFR 95 >09 UE/AVW/0.98   BUN/Creatinine Ratio 16 12 - 28   Sodium 143 134 - 144 mmol/L   Potassium 4.4 3.5 - 5.2 mmol/L   Chloride 104 96 - 106 mmol/L   CO2 28 20 - 29  mmol/L   Calcium 8.8 8.7 - 10.3 mg/dL   Total Protein 6.9 6.0 - 8.5 g/dL   Albumin 3.7 (L) 3.9 - 4.9 g/dL   Globulin, Total 3.2 1.5 - 4.5 g/dL   Albumin/Globulin Ratio 1.2    Bilirubin Total 0.6 0.0 - 1.2 mg/dL   Alkaline Phosphatase 163 (H) 44 - 121 IU/L   AST 23 0 - 40 IU/L   ALT 16 0 - 32 IU/L  Lipid Panel w/o Chol/HDL Ratio     Status: None   Collection Time: 05/02/23 10:15 AM  Result Value Ref Range   Cholesterol, Total 161 100 - 199 mg/dL   Triglycerides 119 0 - 149 mg/dL   HDL 54 >14 mg/dL   VLDL Cholesterol Cal 22 5 - 40 mg/dL   LDL Chol Calc (NIH) 85 0 - 99 mg/dL  Hgb N8G w/o eAG     Status: Abnormal   Collection Time: 05/02/23 10:15 AM  Result Value Ref Range   Hgb A1c MFr Bld 6.5 (H) 4.8 - 5.6 %    Comment:          Prediabetes: 5.7 - 6.4          Diabetes: >6.4          Glycemic control for adults with diabetes: <7.0       Assessment & Plan:  As per problem list  Problem List Items Addressed This Visit       Cardiovascular and Mediastinum   Primary hypertension     Endocrine   Diabetes mellitus without complication (HCC) - Primary   Relevant Orders   Lipid panel   Hemoglobin A1c     Other   Chronic pain syndrome   Relevant Medications   morphine (MS CONTIN) 15 MG 12 hr tablet   morphine (MS CONTIN) 15 MG 12 hr tablet (Start on 07/05/2023)   Misc. Devices (ROLLATOR ULTRA-LIGHT) MISC    Return in about 2 months (around 08/06/2023) for fu with labs prior.   Total time spent: 20 minutes  Luna Fuse, MD  06/05/2023   This document may have  been prepared by [employer] and as such may include unintentional dictation errors.

## 2023-06-11 ENCOUNTER — Other Ambulatory Visit: Payer: Self-pay | Admitting: Internal Medicine

## 2023-06-13 ENCOUNTER — Ambulatory Visit (INDEPENDENT_AMBULATORY_CARE_PROVIDER_SITE_OTHER): Payer: Medicare HMO

## 2023-06-13 DIAGNOSIS — H40033 Anatomical narrow angle, bilateral: Secondary | ICD-10-CM | POA: Diagnosis not present

## 2023-06-13 DIAGNOSIS — S0990XA Unspecified injury of head, initial encounter: Secondary | ICD-10-CM

## 2023-06-13 DIAGNOSIS — H401121 Primary open-angle glaucoma, left eye, mild stage: Secondary | ICD-10-CM | POA: Diagnosis not present

## 2023-06-13 DIAGNOSIS — H40031 Anatomical narrow angle, right eye: Secondary | ICD-10-CM | POA: Diagnosis not present

## 2023-06-13 DIAGNOSIS — H40001 Preglaucoma, unspecified, right eye: Secondary | ICD-10-CM | POA: Diagnosis not present

## 2023-06-13 DIAGNOSIS — D329 Benign neoplasm of meninges, unspecified: Secondary | ICD-10-CM

## 2023-06-13 DIAGNOSIS — H40032 Anatomical narrow angle, left eye: Secondary | ICD-10-CM | POA: Diagnosis not present

## 2023-06-15 ENCOUNTER — Other Ambulatory Visit: Payer: Self-pay | Admitting: Internal Medicine

## 2023-06-20 NOTE — Progress Notes (Signed)
Spoke with pt verified DOB and her and daughter both verbalized understanding.

## 2023-06-29 ENCOUNTER — Ambulatory Visit: Payer: Medicare HMO

## 2023-07-07 DIAGNOSIS — H40032 Anatomical narrow angle, left eye: Secondary | ICD-10-CM | POA: Diagnosis not present

## 2023-07-21 DIAGNOSIS — H40031 Anatomical narrow angle, right eye: Secondary | ICD-10-CM | POA: Diagnosis not present

## 2023-08-01 ENCOUNTER — Other Ambulatory Visit: Payer: Medicare HMO

## 2023-08-01 DIAGNOSIS — E782 Mixed hyperlipidemia: Secondary | ICD-10-CM | POA: Diagnosis not present

## 2023-08-01 DIAGNOSIS — E119 Type 2 diabetes mellitus without complications: Secondary | ICD-10-CM | POA: Diagnosis not present

## 2023-08-04 ENCOUNTER — Ambulatory Visit (INDEPENDENT_AMBULATORY_CARE_PROVIDER_SITE_OTHER): Payer: Medicare HMO | Admitting: Internal Medicine

## 2023-08-04 ENCOUNTER — Encounter: Payer: Self-pay | Admitting: Internal Medicine

## 2023-08-04 VITALS — BP 149/68 | HR 80 | Ht 69.0 in | Wt 261.8 lb

## 2023-08-04 DIAGNOSIS — E119 Type 2 diabetes mellitus without complications: Secondary | ICD-10-CM | POA: Diagnosis not present

## 2023-08-04 DIAGNOSIS — G894 Chronic pain syndrome: Secondary | ICD-10-CM

## 2023-08-04 DIAGNOSIS — Z8679 Personal history of other diseases of the circulatory system: Secondary | ICD-10-CM

## 2023-08-04 DIAGNOSIS — E782 Mixed hyperlipidemia: Secondary | ICD-10-CM | POA: Diagnosis not present

## 2023-08-04 MED ORDER — APIXABAN 5 MG PO TABS
5.0000 mg | ORAL_TABLET | Freq: Two times a day (BID) | ORAL | 1 refills | Status: DC
Start: 2023-08-04 — End: 2024-01-31

## 2023-08-04 MED ORDER — ATORVASTATIN CALCIUM 10 MG PO TABS
10.0000 mg | ORAL_TABLET | Freq: Every day | ORAL | 0 refills | Status: DC
Start: 2023-08-04 — End: 2023-10-04

## 2023-08-04 MED ORDER — MORPHINE SULFATE ER 15 MG PO TBCR: 15.0000 mg | EXTENDED_RELEASE_TABLET | Freq: Two times a day (BID) | ORAL | 0 refills | Status: DC

## 2023-08-04 NOTE — Progress Notes (Signed)
Established Patient Office Visit  Subjective:  Patient ID: Yolanda Harris, female    DOB: Jan 26, 1956  Age: 67 y.o. MRN: [id number]  Chief Complaint  Patient presents with   Pain Management    Here for pain management follow up. Chronic pain well controlled on current analgesia. Last UDS satisfactory and pill counts have also been satisfactory. Labs reviewed and notable for well controlled diabetes, A1c improved and remains at target, lipids now uncontrolled to which she admits dietary indiscretion.  No other concerns at this time.   Past Medical History:  Diagnosis Date   Diabetes mellitus without complication (HCC)    Hypertension    Sciatic leg pain     Past Surgical History:  Procedure Laterality Date   ABDOMINAL HYSTERECTOMY     HTN      Social History   Socioeconomic History   Marital status: Married    Spouse name: Not on file   Number of children: Not on file   Years of education: Not on file   Highest education level: Not on file  Occupational History   Not on file  Tobacco Use   Smoking status: Every Day    Current packs/day: 0.50    Types: Cigarettes   Smokeless tobacco: Never  Vaping Use   Vaping status: Never Used  Substance and Sexual Activity   Alcohol use: Not Currently   Drug use: Never   Sexual activity: Not on file  Other Topics Concern   Not on file  Social History Narrative   Not on file   Social Determinants of Health   Financial Resource Strain: Not on file  Food Insecurity: Not on file  Transportation Needs: Not on file  Physical Activity: Not on file  Stress: Not on file  Social Connections: Not on file  Intimate Partner Violence: Not on file    Family History  Problem Relation Age of Onset   Breast cancer Neg Hx     Allergies  Allergen Reactions   Amiodarone Shortness Of Breath   Sulfa Antibiotics Rash    Review of Systems  Constitutional: Negative.   HENT: Negative.    Eyes: Negative.   Respiratory:  Negative.    Cardiovascular: Negative.   Gastrointestinal: Negative.   Genitourinary: Negative.   Musculoskeletal: Negative.   Skin: Negative.   Neurological: Negative.        Poor balance  Endo/Heme/Allergies: Negative.   Psychiatric/Behavioral: Negative.    All other systems reviewed and are negative.      Objective:   BP (!) 149/68   Pulse 80   Ht 5\' 9"  (1.753 m)   Wt 261 lb 12.8 oz (118.8 kg)   SpO2 95%   BMI 38.66 kg/m   Vitals:   08/04/23 1131  BP: (!) 149/68  Pulse: 80  Height: 5\' 9"  (1.753 m)  Weight: 261 lb 12.8 oz (118.8 kg)  SpO2: 95%  BMI (Calculated): 38.64    Physical Exam Vitals reviewed.  Constitutional:      General: She is not in acute distress.    Appearance: She is obese.  HENT:     Head: Normocephalic and atraumatic.     Nose: Nose normal.     Mouth/Throat:     Mouth: Mucous membranes are moist.  Eyes:     Extraocular Movements: Extraocular movements intact.     Pupils: Pupils are equal, round, and reactive to light.  Cardiovascular:     Rate and Rhythm: Normal rate and regular rhythm.  Heart sounds: No murmur heard. Pulmonary:     Effort: Pulmonary effort is normal.     Breath sounds: No rhonchi or rales.  Abdominal:     General: Abdomen is flat.     Palpations: There is no hepatomegaly, splenomegaly or mass.  Musculoskeletal:        General: Normal range of motion.     Cervical back: Normal range of motion. No tenderness.     Right lower leg: Edema present.     Left lower leg: Edema present.  Skin:    General: Skin is warm and dry.     Findings: Wound present.     Comments: LLE, superficial clean base with granulating tissue  Neurological:     General: No focal deficit present.     Mental Status: She is alert and oriented to person, place, and time.     Cranial Nerves: No cranial nerve deficit.     Motor: No weakness.  Psychiatric:        Mood and Affect: Mood normal.        Behavior: Behavior normal.      No  results found for any visits on 08/04/23.  Recent Results (from the past 2160 hour(Sebastion Jun))  Lipid panel     Status: Abnormal   Collection Time: 08/01/23 10:16 AM  Result Value Ref Range   Cholesterol, Total 181 100 - 199 mg/dL   Triglycerides 161 (H) 0 - 149 mg/dL   HDL 51 >09 mg/dL   VLDL Cholesterol Cal 28 5 - 40 mg/dL   LDL Chol Calc (NIH) 604 (H) 0 - 99 mg/dL   Chol/HDL Ratio 3.5 0.0 - 4.4 ratio    Comment:                                   T. Chol/HDL Ratio                                             Men  Women                               1/2 Avg.Risk  3.4    3.3                                   Avg.Risk  5.0    4.4                                2X Avg.Risk  9.6    7.1                                3X Avg.Risk 23.4   11.0   Comprehensive metabolic panel     Status: Abnormal   Collection Time: 08/01/23 10:16 AM  Result Value Ref Range   Glucose 89 70 - 99 mg/dL   BUN 10 8 - 27 mg/dL   Creatinine, Ser 5.40 0.57 - 1.00 mg/dL   eGFR 84 >98 JX/BJY/7.82   BUN/Creatinine Ratio 13 12 - 28   Sodium 142 134 - 144 mmol/L  Potassium 3.9 3.5 - 5.2 mmol/L   Chloride 102 96 - 106 mmol/L   CO2 26 20 - 29 mmol/L   Calcium 8.8 8.7 - 10.3 mg/dL   Total Protein 7.2 6.0 - 8.5 g/dL   Albumin 3.5 (L) 3.9 - 4.9 g/dL   Globulin, Total 3.7 1.5 - 4.5 g/dL   Bilirubin Total 0.8 0.0 - 1.2 mg/dL   Alkaline Phosphatase 157 (H) 44 - 121 IU/L   AST 25 0 - 40 IU/L   ALT 14 0 - 32 IU/L  Hemoglobin A1c     Status: Abnormal   Collection Time: 08/01/23 10:16 AM  Result Value Ref Range   Hgb A1c MFr Bld 6.2 (H) 4.8 - 5.6 %    Comment:          Prediabetes: 5.7 - 6.4          Diabetes: >6.4          Glycemic control for adults with diabetes: <7.0    Est. average glucose Bld gHb Est-mCnc 131 mg/dL      Assessment & Plan:  As per problem list. Stricter low calorie diet, low cholesterol and low fat diet and exercise as much as possible. Problem List Items Addressed This Visit       Endocrine    Diabetes mellitus without complication (HCC)   Relevant Medications   atorvastatin (LIPITOR) 10 MG tablet     Other   Atrial fibrillation, currently in sinus rhythm   Relevant Medications   apixaban (ELIQUIS) 5 MG TABS tablet   Mixed hyperlipidemia   Relevant Medications   apixaban (ELIQUIS) 5 MG TABS tablet   atorvastatin (LIPITOR) 10 MG tablet   Chronic pain syndrome - Primary   Relevant Medications   morphine (MS CONTIN) 15 MG 12 hr tablet    Return in about 2 months (around 10/04/2023) for Pain management.   Total time spent: 20 minutes  Luna Fuse, MD  08/04/2023   This document may have been prepared by [facility] Voice Recognition software and as such may include unintentional dictation errors.

## 2023-08-07 ENCOUNTER — Other Ambulatory Visit: Payer: Self-pay | Admitting: Internal Medicine

## 2023-08-07 DIAGNOSIS — E119 Type 2 diabetes mellitus without complications: Secondary | ICD-10-CM

## 2023-08-09 ENCOUNTER — Ambulatory Visit
Admission: RE | Admit: 2023-08-09 | Discharge: 2023-08-09 | Disposition: A | Discharge status: Home / Self Care | Payer: Medicare HMO | Source: Ambulatory Visit | Attending: Internal Medicine | Admitting: Internal Medicine

## 2023-08-09 ENCOUNTER — Ambulatory Visit
Admission: RE | Admit: 2023-08-09 | Discharge: 2023-08-09 | Disposition: A | Discharge status: Home / Self Care | Payer: Medicare HMO | Attending: Internal Medicine | Admitting: Internal Medicine

## 2023-08-09 ENCOUNTER — Ambulatory Visit (INDEPENDENT_AMBULATORY_CARE_PROVIDER_SITE_OTHER): Payer: Medicare HMO | Admitting: Internal Medicine

## 2023-08-09 VITALS — BP 125/89 | HR 71 | Ht 69.0 in | Wt 262.4 lb

## 2023-08-09 DIAGNOSIS — W108XXA Fall (on) (from) other stairs and steps, initial encounter: Secondary | ICD-10-CM

## 2023-08-09 DIAGNOSIS — M19072 Primary osteoarthritis, left ankle and foot: Secondary | ICD-10-CM | POA: Diagnosis not present

## 2023-08-09 DIAGNOSIS — E119 Type 2 diabetes mellitus without complications: Secondary | ICD-10-CM

## 2023-08-09 DIAGNOSIS — S93509A Unspecified sprain of unspecified toe(s), initial encounter: Secondary | ICD-10-CM

## 2023-08-09 DIAGNOSIS — S92406A Nondisplaced unspecified fracture of unspecified great toe, initial encounter for closed fracture: Secondary | ICD-10-CM

## 2023-08-09 DIAGNOSIS — S92422A Displaced fracture of distal phalanx of left great toe, initial encounter for closed fracture: Secondary | ICD-10-CM | POA: Diagnosis not present

## 2023-08-09 LAB — POCT CBG (FASTING - GLUCOSE)-MANUAL ENTRY: Glucose Fasting, POC: 115 mg/dL — AB (ref 70–99)

## 2023-08-09 NOTE — Progress Notes (Signed)
Established Patient Office Visit  Subjective:  Patient ID: Yolanda Harris, female    DOB: 1955-12-10  Age: 67 y.o. MRN: [id number]  Chief Complaint  Patient presents with   Follow-up    Left foot pain    Larey Seat two weeks ago when she tripped on her porch and c/o left big toe pain and swelling since then. Pain masked by her peripheral neuropathy but can still bear weight on that toe.     No other concerns at this time.   Past Medical History:  Diagnosis Date   Diabetes mellitus without complication (HCC)    Hypertension    Sciatic leg pain     Past Surgical History:  Procedure Laterality Date   ABDOMINAL HYSTERECTOMY     HTN      Social History   Socioeconomic History   Marital status: Married    Spouse name: Not on file   Number of children: Not on file   Years of education: Not on file   Highest education level: Not on file  Occupational History   Not on file  Tobacco Use   Smoking status: Every Day    Current packs/day: 0.50    Types: Cigarettes   Smokeless tobacco: Never  Vaping Use   Vaping status: Never Used  Substance and Sexual Activity   Alcohol use: Not Currently   Drug use: Never   Sexual activity: Not on file  Other Topics Concern   Not on file  Social History Narrative   Not on file   Social Determinants of Health   Financial Resource Strain: Not on file  Food Insecurity: Not on file  Transportation Needs: Not on file  Physical Activity: Not on file  Stress: Not on file  Social Connections: Not on file  Intimate Partner Violence: Not on file    Family History  Problem Relation Age of Onset   Breast cancer Neg Hx     Allergies  Allergen Reactions   Amiodarone Shortness Of Breath   Sulfa Antibiotics Rash    Review of Systems  Musculoskeletal:        As in hpi       Objective:   BP 125/89   Pulse 71   Ht 5\' 9"  (1.753 m)   Wt 262 lb 6.4 oz (119 kg)   SpO2 98%   BMI 38.75 kg/m   Vitals:   08/09/23 0948  BP:  125/89  Pulse: 71  Height: 5\' 9"  (1.753 m)  Weight: 262 lb 6.4 oz (119 kg)  SpO2: 98%  BMI (Calculated): 38.73    Physical Exam Vitals reviewed.  Constitutional:      General: She is not in acute distress.    Appearance: She is obese.  HENT:     Head: Normocephalic and atraumatic.     Nose: Nose normal.     Mouth/Throat:     Mouth: Mucous membranes are moist.  Eyes:     Extraocular Movements: Extraocular movements intact.     Pupils: Pupils are equal, round, and reactive to light.  Cardiovascular:     Rate and Rhythm: Normal rate and regular rhythm.     Heart sounds: No murmur heard. Pulmonary:     Effort: Pulmonary effort is normal.     Breath sounds: No rhonchi or rales.  Abdominal:     General: Abdomen is flat.     Palpations: There is no hepatomegaly, splenomegaly or mass.  Musculoskeletal:  General: Swelling (left big toe) present. Normal range of motion.     Cervical back: Normal range of motion. No tenderness.     Right lower leg: Edema present.     Left lower leg: Edema present.     Comments: Right big toe swollen not erythematous with full range of motion  Skin:    General: Skin is warm and dry.     Findings: Wound present.     Comments: LLE, superficial clean base with granulating tissue  Neurological:     General: No focal deficit present.     Mental Status: She is alert and oriented to person, place, and time.     Cranial Nerves: No cranial nerve deficit.     Motor: No weakness.  Psychiatric:        Mood and Affect: Mood normal.        Behavior: Behavior normal.      Results for orders placed or performed in visit on 08/09/23  POCT CBG (Fasting - Glucose)  Result Value Ref Range   Glucose Fasting, POC 115 (A) 70 - 99 mg/dL    Recent Results (from the past 2160 hour(Madi Bonfiglio))  Lipid panel     Status: Abnormal   Collection Time: 08/01/23 10:16 AM  Result Value Ref Range   Cholesterol, Total 181 100 - 199 mg/dL   Triglycerides 098 (H) 0 - 149  mg/dL   HDL 51 >11 mg/dL   VLDL Cholesterol Cal 28 5 - 40 mg/dL   LDL Chol Calc (NIH) 914 (H) 0 - 99 mg/dL   Chol/HDL Ratio 3.5 0.0 - 4.4 ratio    Comment:                                   T. Chol/HDL Ratio                                             Men  Women                               1/2 Avg.Risk  3.4    3.3                                   Avg.Risk  5.0    4.4                                2X Avg.Risk  9.6    7.1                                3X Avg.Risk 23.4   11.0   Comprehensive metabolic panel     Status: Abnormal   Collection Time: 08/01/23 10:16 AM  Result Value Ref Range   Glucose 89 70 - 99 mg/dL   BUN 10 8 - 27 mg/dL   Creatinine, Ser 7.82 0.57 - 1.00 mg/dL   eGFR 84 >95 AO/ZHY/8.65   BUN/Creatinine Ratio 13 12 - 28   Sodium 142 134 - 144 mmol/L   Potassium 3.9 3.5 - 5.2 mmol/L  Chloride 102 96 - 106 mmol/L   CO2 26 20 - 29 mmol/L   Calcium 8.8 8.7 - 10.3 mg/dL   Total Protein 7.2 6.0 - 8.5 g/dL   Albumin 3.5 (L) 3.9 - 4.9 g/dL   Globulin, Total 3.7 1.5 - 4.5 g/dL   Bilirubin Total 0.8 0.0 - 1.2 mg/dL   Alkaline Phosphatase 157 (H) 44 - 121 IU/L   AST 25 0 - 40 IU/L   ALT 14 0 - 32 IU/L  Hemoglobin A1c     Status: Abnormal   Collection Time: 08/01/23 10:16 AM  Result Value Ref Range   Hgb A1c MFr Bld 6.2 (H) 4.8 - 5.6 %    Comment:          Prediabetes: 5.7 - 6.4          Diabetes: >6.4          Glycemic control for adults with diabetes: <7.0    Est. average glucose Bld gHb Est-mCnc 131 mg/dL  POCT CBG (Fasting - Glucose)     Status: Abnormal   Collection Time: 08/09/23  9:52 AM  Result Value Ref Range   Glucose Fasting, POC 115 (A) 70 - 99 mg/dL      Assessment & Plan:  As per problem list and continue current analgesia. Toe splint. Problem List Items Addressed This Visit       Endocrine   Diabetes mellitus without complication (HCC) - Primary   Relevant Orders   POCT CBG (Fasting - Glucose) (Completed)    No follow-ups on file.    Total time spent: 30 minutes  Luna Fuse, MD  08/09/2023   This document may have been prepared by [facility] Voice Recognition software and as such may include unintentional dictation errors.

## 2023-08-10 ENCOUNTER — Ambulatory Visit: Payer: Medicare HMO | Admitting: Cardiovascular Disease

## 2023-08-18 DIAGNOSIS — S92425A Nondisplaced fracture of distal phalanx of left great toe, initial encounter for closed fracture: Secondary | ICD-10-CM | POA: Diagnosis not present

## 2023-08-21 ENCOUNTER — Other Ambulatory Visit: Payer: Self-pay

## 2023-08-21 NOTE — Addendum Note (Signed)
Addended by: Aundria Mems AHMAD on: 08/21/2023 05:02 PM   Modules accepted: Orders

## 2023-08-22 ENCOUNTER — Other Ambulatory Visit: Payer: Self-pay

## 2023-08-22 MED ORDER — TRUE METRIX AIR GLUCOSE METER DEVI: 0 refills | Status: DC

## 2023-09-04 ENCOUNTER — Other Ambulatory Visit: Payer: Self-pay

## 2023-09-04 MED ORDER — MORPHINE SULFATE ER 15 MG PO TBCR
15.0000 mg | EXTENDED_RELEASE_TABLET | Freq: Two times a day (BID) | ORAL | 0 refills | Status: DC
Start: 2023-09-04 — End: 2023-10-04

## 2023-09-07 ENCOUNTER — Other Ambulatory Visit: Payer: Self-pay | Admitting: Internal Medicine

## 2023-09-13 DIAGNOSIS — S92322D Displaced fracture of second metatarsal bone, left foot, subsequent encounter for fracture with routine healing: Secondary | ICD-10-CM | POA: Diagnosis not present

## 2023-09-13 DIAGNOSIS — S92412D Displaced fracture of proximal phalanx of left great toe, subsequent encounter for fracture with routine healing: Secondary | ICD-10-CM | POA: Diagnosis not present

## 2023-09-15 DIAGNOSIS — H402231 Chronic angle-closure glaucoma, bilateral, mild stage: Secondary | ICD-10-CM | POA: Diagnosis not present

## 2023-09-18 ENCOUNTER — Other Ambulatory Visit: Payer: Self-pay | Admitting: Internal Medicine

## 2023-09-19 ENCOUNTER — Other Ambulatory Visit: Payer: Self-pay | Admitting: Internal Medicine

## 2023-10-04 ENCOUNTER — Encounter: Payer: Self-pay | Admitting: Internal Medicine

## 2023-10-04 ENCOUNTER — Ambulatory Visit (INDEPENDENT_AMBULATORY_CARE_PROVIDER_SITE_OTHER): Payer: Medicare HMO | Admitting: Internal Medicine

## 2023-10-04 VITALS — BP 135/84 | HR 95 | Ht 68.0 in | Wt 271.2 lb

## 2023-10-04 DIAGNOSIS — S92406A Nondisplaced unspecified fracture of unspecified great toe, initial encounter for closed fracture: Secondary | ICD-10-CM | POA: Diagnosis not present

## 2023-10-04 DIAGNOSIS — E782 Mixed hyperlipidemia: Secondary | ICD-10-CM

## 2023-10-04 DIAGNOSIS — I10 Essential (primary) hypertension: Secondary | ICD-10-CM

## 2023-10-04 DIAGNOSIS — G894 Chronic pain syndrome: Secondary | ICD-10-CM | POA: Diagnosis not present

## 2023-10-04 DIAGNOSIS — E119 Type 2 diabetes mellitus without complications: Secondary | ICD-10-CM

## 2023-10-04 MED ORDER — ATORVASTATIN CALCIUM 10 MG PO TABS: 10.0000 mg | ORAL_TABLET | Freq: Every day | ORAL | 0 refills | Status: DC

## 2023-10-04 MED ORDER — MORPHINE SULFATE ER 15 MG PO TBCR: 15.0000 mg | EXTENDED_RELEASE_TABLET | Freq: Two times a day (BID) | ORAL | 0 refills | Status: DC

## 2023-10-04 MED ORDER — PREGABALIN 300 MG PO CAPS: 300.0000 mg | ORAL_CAPSULE | Freq: Two times a day (BID) | ORAL | 3 refills | Status: DC

## 2023-10-04 MED ORDER — CELECOXIB 400 MG PO CAPS: 400.0000 mg | ORAL_CAPSULE | Freq: Every day | ORAL | 5 refills | Status: DC

## 2023-10-04 NOTE — Progress Notes (Signed)
Established Patient Office Visit  Subjective:  Patient ID: Yolanda Harris, female    DOB: 1956-11-09  Age: 67 y.o. MRN: [id number]  Chief Complaint  Patient presents with   Pain Management    Here for pain management follow up. Chronic pain well controlled on current analgesia. Last UDS satisfactory and pill counts have also been satisfactory. Fractured two of her left toes.     No other concerns at this time.   Past Medical History:  Diagnosis Date   Diabetes mellitus without complication (HCC)    Hypertension    Sciatic leg pain     Past Surgical History:  Procedure Laterality Date   ABDOMINAL HYSTERECTOMY     HTN      Social History   Socioeconomic History   Marital status: Married    Spouse name: Not on file   Number of children: Not on file   Years of education: Not on file   Highest education level: Not on file  Occupational History   Not on file  Tobacco Use   Smoking status: Every Day    Current packs/day: 0.50    Types: Cigarettes   Smokeless tobacco: Never  Vaping Use   Vaping status: Never Used  Substance and Sexual Activity   Alcohol use: Not Currently   Drug use: Never   Sexual activity: Not on file  Other Topics Concern   Not on file  Social History Narrative   Not on file   Social Determinants of Health   Financial Resource Strain: Not on file  Food Insecurity: Not on file  Transportation Needs: Not on file  Physical Activity: Not on file  Stress: Not on file  Social Connections: Not on file  Intimate Partner Violence: Not on file    Family History  Problem Relation Age of Onset   Breast cancer Neg Hx     Allergies  Allergen Reactions   Amiodarone Shortness Of Breath   Sulfa Antibiotics Rash    Review of Systems  Musculoskeletal:        As in hpi       Objective:   BP 135/84   Pulse 95   Ht 5\' 8"  (1.727 m)   Wt 271 lb 3.2 oz (123 kg)   SpO2 95%   BMI 41.24 kg/m   Vitals:   10/04/23 1011  BP: 135/84   Pulse: 95  Height: 5\' 8"  (1.727 m)  Weight: 271 lb 3.2 oz (123 kg)  SpO2: 95%  BMI (Calculated): 41.25    Physical Exam Vitals reviewed.  Constitutional:      General: She is not in acute distress.    Appearance: She is obese.  HENT:     Head: Normocephalic and atraumatic.     Nose: Nose normal.     Mouth/Throat:     Mouth: Mucous membranes are moist.  Eyes:     Extraocular Movements: Extraocular movements intact.     Pupils: Pupils are equal, round, and reactive to light.  Cardiovascular:     Rate and Rhythm: Normal rate and regular rhythm.     Heart sounds: No murmur heard. Pulmonary:     Effort: Pulmonary effort is normal.     Breath sounds: No rhonchi or rales.  Abdominal:     General: Abdomen is flat.     Palpations: There is no hepatomegaly, splenomegaly or mass.  Musculoskeletal:        General: No swelling. Normal range of motion.  Cervical back: Normal range of motion. No tenderness.     Right lower leg: Edema present.     Left lower leg: Edema present.     Left foot: Normal.     Comments: Left soft shoe  Skin:    General: Skin is warm and dry.     Findings: Wound present.     Comments: LLE, superficial clean base with granulating tissue  Neurological:     General: No focal deficit present.     Mental Status: She is alert and oriented to person, place, and time.     Cranial Nerves: No cranial nerve deficit.     Motor: No weakness.  Psychiatric:        Mood and Affect: Mood normal.        Behavior: Behavior normal.      No results found for any visits on 10/04/23.  Recent Results (from the past 2160 hour(Chike Farrington))  Lipid panel     Status: Abnormal   Collection Time: 08/01/23 10:16 AM  Result Value Ref Range   Cholesterol, Total 181 100 - 199 mg/dL   Triglycerides 601 (H) 0 - 149 mg/dL   HDL 51 >09 mg/dL   VLDL Cholesterol Cal 28 5 - 40 mg/dL   LDL Chol Calc (NIH) 323 (H) 0 - 99 mg/dL   Chol/HDL Ratio 3.5 0.0 - 4.4 ratio    Comment:                                    T. Chol/HDL Ratio                                             Men  Women                               1/2 Avg.Risk  3.4    3.3                                   Avg.Risk  5.0    4.4                                2X Avg.Risk  9.6    7.1                                3X Avg.Risk 23.4   11.0   Comprehensive metabolic panel     Status: Abnormal   Collection Time: 08/01/23 10:16 AM  Result Value Ref Range   Glucose 89 70 - 99 mg/dL   BUN 10 8 - 27 mg/dL   Creatinine, Ser 5.57 0.57 - 1.00 mg/dL   eGFR 84 >32 KG/URK/2.70   BUN/Creatinine Ratio 13 12 - 28   Sodium 142 134 - 144 mmol/L   Potassium 3.9 3.5 - 5.2 mmol/L   Chloride 102 96 - 106 mmol/L   CO2 26 20 - 29 mmol/L   Calcium 8.8 8.7 - 10.3 mg/dL   Total Protein 7.2 6.0 - 8.5 g/dL   Albumin 3.5 (L) 3.9 - 4.9 g/dL  Globulin, Total 3.7 1.5 - 4.5 g/dL   Bilirubin Total 0.8 0.0 - 1.2 mg/dL   Alkaline Phosphatase 157 (H) 44 - 121 IU/L   AST 25 0 - 40 IU/L   ALT 14 0 - 32 IU/L  Hemoglobin A1c     Status: Abnormal   Collection Time: 08/01/23 10:16 AM  Result Value Ref Range   Hgb A1c MFr Bld 6.2 (H) 4.8 - 5.6 %    Comment:          Prediabetes: 5.7 - 6.4          Diabetes: >6.4          Glycemic control for adults with diabetes: <7.0    Est. average glucose Bld gHb Est-mCnc 131 mg/dL  POCT CBG (Fasting - Glucose)     Status: Abnormal   Collection Time: 08/09/23  9:52 AM  Result Value Ref Range   Glucose Fasting, POC 115 (A) 70 - 99 mg/dL      Assessment & Plan:  As per problem list  Problem List Items Addressed This Visit       Cardiovascular and Mediastinum   Primary hypertension   Relevant Medications   atorvastatin (LIPITOR) 10 MG tablet     Endocrine   Diabetes mellitus without complication (HCC)   Relevant Medications   atorvastatin (LIPITOR) 10 MG tablet     Other   Mixed hyperlipidemia   Relevant Medications   atorvastatin (LIPITOR) 10 MG tablet   Chronic pain syndrome - Primary    Relevant Medications   pregabalin (LYRICA) 300 MG capsule   morphine (MS CONTIN) 15 MG 12 hr tablet   celecoxib (CELEBREX) 400 MG capsule   Other Visit Diagnoses     Closed nondisplaced fracture of phalanx of great toe, unspecified laterality, unspecified phalanx, initial encounter           Return in about 3 weeks (around 10/25/2023) for awv with labs prior.   Total time spent: 20 minutes  Luna Fuse, MD  10/04/2023   This document may have been prepared by [facility] Voice Recognition software and as such may include unintentional dictation errors.

## 2023-10-12 ENCOUNTER — Other Ambulatory Visit: Payer: Medicare HMO

## 2023-10-12 ENCOUNTER — Other Ambulatory Visit: Payer: Self-pay | Admitting: Internal Medicine

## 2023-10-12 DIAGNOSIS — E119 Type 2 diabetes mellitus without complications: Secondary | ICD-10-CM | POA: Diagnosis not present

## 2023-10-12 DIAGNOSIS — I10 Essential (primary) hypertension: Secondary | ICD-10-CM | POA: Diagnosis not present

## 2023-10-13 LAB — COMPREHENSIVE METABOLIC PANEL
ALT: 18 [IU]/L (ref 0–32)
AST: 30 [IU]/L (ref 0–40)
Albumin: 3.2 g/dL — ABNORMAL LOW (ref 3.9–4.9)
Alkaline Phosphatase: 223 [IU]/L — ABNORMAL HIGH (ref 44–121)
BUN/Creatinine Ratio: 12 (ref 12–28)
BUN: 8 mg/dL (ref 8–27)
Bilirubin Total: 1 mg/dL (ref 0.0–1.2)
CO2: 26 mmol/L (ref 20–29)
Calcium: 9 mg/dL (ref 8.7–10.3)
Chloride: 105 mmol/L (ref 96–106)
Creatinine, Ser: 0.66 mg/dL (ref 0.57–1.00)
Globulin, Total: 3.7 g/dL (ref 1.5–4.5)
Glucose: 145 mg/dL — ABNORMAL HIGH (ref 70–99)
Potassium: 4.5 mmol/L (ref 3.5–5.2)
Sodium: 144 mmol/L (ref 134–144)
Total Protein: 6.9 g/dL (ref 6.0–8.5)
eGFR: 95 mL/min/{1.73_m2} (ref >59)

## 2023-10-13 LAB — CBC WITH DIFF/PLATELET
Basophils Absolute: 0.1 10*3/uL (ref 0.0–0.2)
Basos: 1 %
EOS (ABSOLUTE): 0.5 10*3/uL — ABNORMAL HIGH (ref 0.0–0.4)
Eos: 7 %
Hematocrit: 39.1 % (ref 34.0–46.6)
Hemoglobin: 13.1 g/dL (ref 11.1–15.9)
Immature Grans (Abs): 0 10*3/uL (ref 0.0–0.1)
Immature Granulocytes: 0 %
Lymphocytes Absolute: 3.3 10*3/uL — ABNORMAL HIGH (ref 0.7–3.1)
Lymphs: 44 %
MCH: 31.1 pg (ref 26.6–33.0)
MCHC: 33.5 g/dL (ref 31.5–35.7)
MCV: 93 fL (ref 79–97)
Monocytes Absolute: 0.9 10*3/uL (ref 0.1–0.9)
Monocytes: 12 %
Neutrophils Absolute: 2.8 10*3/uL (ref 1.4–7.0)
Neutrophils: 36 %
Platelets: 89 10*3/uL — CL (ref 150–450)
RBC: 4.21 x10E6/uL (ref 3.77–5.28)
RDW: 15.7 % — ABNORMAL HIGH (ref 11.7–15.4)
WBC: 7.7 10*3/uL (ref 3.4–10.8)

## 2023-10-13 LAB — LIPID PANEL
Chol/HDL Ratio: 3.5 ratio (ref 0.0–4.4)
Cholesterol, Total: 164 mg/dL (ref 100–199)
HDL: 47 mg/dL (ref >39)
LDL Chol Calc (NIH): 88 mg/dL (ref 0–99)
Triglycerides: 169 mg/dL — ABNORMAL HIGH (ref 0–149)
VLDL Cholesterol Cal: 29 mg/dL (ref 5–40)

## 2023-10-13 LAB — HEMOGLOBIN A1C
Est. average glucose Bld gHb Est-mCnc: 194 mg/dL
Hgb A1c MFr Bld: 8.4 % — ABNORMAL HIGH (ref 4.8–5.6)

## 2023-10-18 ENCOUNTER — Encounter: Payer: Self-pay | Admitting: Internal Medicine

## 2023-10-18 ENCOUNTER — Ambulatory Visit: Payer: Medicare HMO | Admitting: Internal Medicine

## 2023-10-18 ENCOUNTER — Other Ambulatory Visit: Payer: Self-pay

## 2023-10-18 ENCOUNTER — Other Ambulatory Visit: Payer: Self-pay | Admitting: Internal Medicine

## 2023-10-18 VITALS — BP 137/82 | HR 88 | Ht 68.0 in | Wt 273.4 lb

## 2023-10-18 DIAGNOSIS — Z23 Encounter for immunization: Secondary | ICD-10-CM

## 2023-10-18 DIAGNOSIS — M25562 Pain in left knee: Secondary | ICD-10-CM | POA: Diagnosis not present

## 2023-10-18 DIAGNOSIS — E119 Type 2 diabetes mellitus without complications: Secondary | ICD-10-CM

## 2023-10-18 DIAGNOSIS — Z0001 Encounter for general adult medical examination with abnormal findings: Secondary | ICD-10-CM

## 2023-10-18 DIAGNOSIS — Z0130 Encounter for examination of blood pressure without abnormal findings: Secondary | ICD-10-CM

## 2023-10-18 DIAGNOSIS — E782 Mixed hyperlipidemia: Secondary | ICD-10-CM

## 2023-10-18 DIAGNOSIS — M25561 Pain in right knee: Secondary | ICD-10-CM | POA: Diagnosis not present

## 2023-10-18 LAB — POCT CBG (FASTING - GLUCOSE)-MANUAL ENTRY: Glucose Fasting, POC: 133 mg/dL — AB (ref 70–99)

## 2023-10-18 MED ORDER — TRESIBA FLEXTOUCH 200 UNIT/ML ~~LOC~~ SOPN: 110.0000 [IU] | PEN_INJECTOR | Freq: Every day | SUBCUTANEOUS | Status: DC

## 2023-10-18 MED ORDER — DICLOFENAC SODIUM 1 % EX GEL
4.0000 g | Freq: Two times a day (BID) | CUTANEOUS | 2 refills | Status: AC
Start: 2023-10-18 — End: 2024-01-16

## 2023-10-18 NOTE — Progress Notes (Signed)
Established Patient Office Visit  Subjective:  Patient ID: Yolanda Harris, female    DOB: 09-29-1956  Age: 67 y.o. MRN: [id number]  Chief Complaint  Patient presents with   Knee Pain    Knee pain from past injury  Right knee     No new complaints, here for AWV refer to quality metrics and scanned documents.   Labs reviewed and notable for uncontrolled diabetes, A1c not at target, lipids at target with unremarkable cmp. Denies any hypoglycemic episodes. Has not received any Ozempic via patient assistance this year.    No other concerns at this time.   Past Medical History:  Diagnosis Date   Diabetes mellitus without complication (HCC)    Hypertension    Sciatic leg pain     Past Surgical History:  Procedure Laterality Date   ABDOMINAL HYSTERECTOMY     HTN      Social History   Socioeconomic History   Marital status: Married    Spouse name: Not on file   Number of children: Not on file   Years of education: Not on file   Highest education level: Not on file  Occupational History   Not on file  Tobacco Use   Smoking status: Every Day    Current packs/day: 0.50    Types: Cigarettes   Smokeless tobacco: Never  Vaping Use   Vaping status: Never Used  Substance and Sexual Activity   Alcohol use: Not Currently   Drug use: Never   Sexual activity: Not on file  Other Topics Concern   Not on file  Social History Narrative   Not on file   Social Determinants of Health   Financial Resource Strain: Not on file  Food Insecurity: Not on file  Transportation Needs: Not on file  Physical Activity: Not on file  Stress: Not on file  Social Connections: Not on file  Intimate Partner Violence: Not on file    Family History  Problem Relation Age of Onset   Breast cancer Neg Hx     Allergies  Allergen Reactions   Amiodarone Shortness Of Breath   Sulfa Antibiotics Rash    Outpatient Medications Prior to Visit  Medication Sig   amitriptyline (ELAVIL) 25  MG tablet Take 25 mg by mouth every morning.   amLODipine-benazepril (LOTREL) 5-20 MG capsule TAKE 1 CAPSULE EVERY DAY   apixaban (ELIQUIS) 5 MG TABS tablet Take 1 tablet (5 mg total) by mouth 2 (two) times daily.   atorvastatin (LIPITOR) 10 MG tablet TAKE 1 TABLET EVERY EVENING   Blood Glucose Monitoring Suppl (TRUE METRIX AIR GLUCOSE METER) DEVI UUD   celecoxib (CELEBREX) 400 MG capsule Take 1 capsule (400 mg total) by mouth daily after breakfast.   cyclobenzaprine (FLEXERIL) 10 MG tablet TAKE 1 TABLET BY MOUTH THREE TIMES DAILY   furosemide (LASIX) 40 MG tablet Take 1 tablet (40 mg total) by mouth daily.   metoprolol tartrate (LOPRESSOR) 25 MG tablet Take 1 tablet by mouth twice daily   morphine (MS CONTIN) 15 MG 12 hr tablet Take 1 tablet (15 mg total) by mouth 2 (two) times daily.   pioglitazone (ACTOS) 45 MG tablet TAKE 1 TABLET EVERY MORNING   pregabalin (LYRICA) 300 MG capsule Take 1 capsule (300 mg total) by mouth 2 (two) times daily.   Zinc Oxide 15 % CREA Apply to left leg wound bid   No facility-administered medications prior to visit.    Review of Systems  Constitutional: Negative.  HENT: Negative.    Eyes: Negative.   Respiratory: Negative.    Cardiovascular: Negative.   Gastrointestinal: Negative.   Genitourinary: Negative.   Musculoskeletal:  Positive for joint pain (bilat knee pain worse on the right).  Skin: Negative.   Neurological: Negative.        Poor balance  Endo/Heme/Allergies: Negative.   Psychiatric/Behavioral: Negative.    All other systems reviewed and are negative.      Objective:   BP 137/82   Pulse 88   Ht 5\' 8"  (1.727 m)   Wt 273 lb 6.4 oz (124 kg)   SpO2 95%   BMI 41.57 kg/m   Vitals:   10/18/23 1117  BP: 137/82  Pulse: 88  Height: 5\' 8"  (1.727 m)  Weight: 273 lb 6.4 oz (124 kg)  SpO2: 95%  BMI (Calculated): 41.58    Physical Exam   No results found for any visits on 10/18/23.  Recent Results (from the past 2160 hour(Bartholomew Ramesh))   Lipid panel     Status: Abnormal   Collection Time: 08/01/23 10:16 AM  Result Value Ref Range   Cholesterol, Total 181 100 - 199 mg/dL   Triglycerides 962 (H) 0 - 149 mg/dL   HDL 51 >95 mg/dL   VLDL Cholesterol Cal 28 5 - 40 mg/dL   LDL Chol Calc (NIH) 284 (H) 0 - 99 mg/dL   Chol/HDL Ratio 3.5 0.0 - 4.4 ratio    Comment:                                   T. Chol/HDL Ratio                                             Men  Women                               1/2 Avg.Risk  3.4    3.3                                   Avg.Risk  5.0    4.4                                2X Avg.Risk  9.6    7.1                                3X Avg.Risk 23.4   11.0   Comprehensive metabolic panel     Status: Abnormal   Collection Time: 08/01/23 10:16 AM  Result Value Ref Range   Glucose 89 70 - 99 mg/dL   BUN 10 8 - 27 mg/dL   Creatinine, Ser 1.32 0.57 - 1.00 mg/dL   eGFR 84 >44 WN/UUV/2.53   BUN/Creatinine Ratio 13 12 - 28   Sodium 142 134 - 144 mmol/L   Potassium 3.9 3.5 - 5.2 mmol/L   Chloride 102 96 - 106 mmol/L   CO2 26 20 - 29 mmol/L   Calcium 8.8 8.7 - 10.3 mg/dL   Total Protein 7.2 6.0 - 8.5 g/dL  Albumin 3.5 (L) 3.9 - 4.9 g/dL   Globulin, Total 3.7 1.5 - 4.5 g/dL   Bilirubin Total 0.8 0.0 - 1.2 mg/dL   Alkaline Phosphatase 157 (H) 44 - 121 IU/L   AST 25 0 - 40 IU/L   ALT 14 0 - 32 IU/L  Hemoglobin A1c     Status: Abnormal   Collection Time: 08/01/23 10:16 AM  Result Value Ref Range   Hgb A1c MFr Bld 6.2 (H) 4.8 - 5.6 %    Comment:          Prediabetes: 5.7 - 6.4          Diabetes: >6.4          Glycemic control for adults with diabetes: <7.0    Est. average glucose Bld gHb Est-mCnc 131 mg/dL  POCT CBG (Fasting - Glucose)     Status: Abnormal   Collection Time: 08/09/23  9:52 AM  Result Value Ref Range   Glucose Fasting, POC 115 (A) 70 - 99 mg/dL  Comprehensive metabolic panel     Status: Abnormal   Collection Time: 10/12/23 10:38 AM  Result Value Ref Range   Glucose 145 (H) 70  - 99 mg/dL   BUN 8 8 - 27 mg/dL   Creatinine, Ser 6.21 0.57 - 1.00 mg/dL   eGFR 95 >30 QM/VHQ/4.69   BUN/Creatinine Ratio 12 12 - 28   Sodium 144 134 - 144 mmol/L   Potassium 4.5 3.5 - 5.2 mmol/L   Chloride 105 96 - 106 mmol/L   CO2 26 20 - 29 mmol/L   Calcium 9.0 8.7 - 10.3 mg/dL   Total Protein 6.9 6.0 - 8.5 g/dL   Albumin 3.2 (L) 3.9 - 4.9 g/dL   Globulin, Total 3.7 1.5 - 4.5 g/dL   Bilirubin Total 1.0 0.0 - 1.2 mg/dL   Alkaline Phosphatase 223 (H) 44 - 121 IU/L   AST 30 0 - 40 IU/L   ALT 18 0 - 32 IU/L  Lipid panel     Status: Abnormal   Collection Time: 10/12/23 10:41 AM  Result Value Ref Range   Cholesterol, Total 164 100 - 199 mg/dL   Triglycerides 629 (H) 0 - 149 mg/dL   HDL 47 >52 mg/dL   VLDL Cholesterol Cal 29 5 - 40 mg/dL   LDL Chol Calc (NIH) 88 0 - 99 mg/dL   Chol/HDL Ratio 3.5 0.0 - 4.4 ratio    Comment:                                   T. Chol/HDL Ratio                                             Men  Women                               1/2 Avg.Risk  3.4    3.3                                   Avg.Risk  5.0    4.4  2X Avg.Risk  9.6    7.1                                3X Avg.Risk 23.4   11.0   CBC With Diff/Platelet     Status: Abnormal   Collection Time: 10/12/23 10:45 AM  Result Value Ref Range   WBC 7.7 3.4 - 10.8 x10E3/uL    Comment: **Effective October 23, 2023 profile 093235 WBC will be made**   non-orderable as a stand-alone order code.    RBC 4.21 3.77 - 5.28 x10E6/uL    Comment: Target cells present. Polychromasia present    Hemoglobin 13.1 11.1 - 15.9 g/dL   Hematocrit 57.3 22.0 - 46.6 %   MCV 93 79 - 97 fL   MCH 31.1 26.6 - 33.0 pg   MCHC 33.5 31.5 - 35.7 g/dL   RDW 25.4 (H) 27.0 - 62.3 %   Platelets 89 (LL) 150 - 450 x10E3/uL   Neutrophils 36 Not Estab. %   Lymphs 44 Not Estab. %   Monocytes 12 Not Estab. %   Eos 7 Not Estab. %   Basos 1 Not Estab. %   Neutrophils Absolute 2.8 1.4 - 7.0 x10E3/uL    Lymphocytes Absolute 3.3 (H) 0.7 - 3.1 x10E3/uL   Monocytes Absolute 0.9 0.1 - 0.9 x10E3/uL   EOS (ABSOLUTE) 0.5 (H) 0.0 - 0.4 x10E3/uL   Basophils Absolute 0.1 0.0 - 0.2 x10E3/uL   Immature Granulocytes 0 Not Estab. %   Immature Grans (Abs) 0.0 0.0 - 0.1 x10E3/uL   Hematology Comments: Note:     Comment: Verified by microscopic examination.  Hemoglobin A1c     Status: Abnormal   Collection Time: 10/12/23 10:47 AM  Result Value Ref Range   Hgb A1c MFr Bld 8.4 (H) 4.8 - 5.6 %    Comment:          Prediabetes: 5.7 - 6.4          Diabetes: >6.4          Glycemic control for adults with diabetes: <7.0    Est. average glucose Bld gHb Est-mCnc 194 mg/dL      Assessment & Plan:  As per problem list. Declines ortho referral for her knees. Problem List Items Addressed This Visit       Endocrine   Diabetes mellitus without complication (HCC) - Primary   Relevant Medications   insulin degludec (TRESIBA FLEXTOUCH) 200 UNIT/ML FlexTouch Pen   Other Relevant Orders   POCT CBG (Fasting - Glucose)   Other Visit Diagnoses     Arthralgia of both knees       Relevant Medications   diclofenac Sodium (VOLTAREN) 1 % GEL   Other Relevant Orders   DG Knee Complete 4 Views Left   DG Knee Complete 4 Views Right       Return in about 6 weeks (around 11/29/2023) for Pain Management, lab results.   Total time spent: 30 minutes  Luna Fuse, MD  10/18/2023   This document may have been prepared by [facility] Voice Recognition software and as such may include unintentional dictation errors.

## 2023-10-24 ENCOUNTER — Other Ambulatory Visit: Payer: Medicare HMO

## 2023-10-25 DIAGNOSIS — S92412D Displaced fracture of proximal phalanx of left great toe, subsequent encounter for fracture with routine healing: Secondary | ICD-10-CM | POA: Diagnosis not present

## 2023-10-25 DIAGNOSIS — S92322D Displaced fracture of second metatarsal bone, left foot, subsequent encounter for fracture with routine healing: Secondary | ICD-10-CM | POA: Diagnosis not present

## 2023-10-30 ENCOUNTER — Telehealth: Payer: Self-pay | Admitting: Internal Medicine

## 2023-10-30 NOTE — Telephone Encounter (Signed)
Patient left VM that she is having bad upper respiratory symptoms, SOB and fever. Would like an antibiotic sent to pharmacy. Please advise.

## 2023-10-31 ENCOUNTER — Other Ambulatory Visit: Payer: Medicare HMO

## 2023-10-31 DIAGNOSIS — H6692 Otitis media, unspecified, left ear: Secondary | ICD-10-CM | POA: Diagnosis not present

## 2023-10-31 DIAGNOSIS — Z20822 Contact with and (suspected) exposure to covid-19: Secondary | ICD-10-CM | POA: Diagnosis not present

## 2023-10-31 DIAGNOSIS — R059 Cough, unspecified: Secondary | ICD-10-CM | POA: Diagnosis not present

## 2023-10-31 DIAGNOSIS — J069 Acute upper respiratory infection, unspecified: Secondary | ICD-10-CM | POA: Diagnosis not present

## 2023-11-03 ENCOUNTER — Other Ambulatory Visit: Payer: Self-pay

## 2023-11-03 DIAGNOSIS — G894 Chronic pain syndrome: Secondary | ICD-10-CM

## 2023-11-03 MED ORDER — MORPHINE SULFATE ER 15 MG PO TBCR: 15.0000 mg | EXTENDED_RELEASE_TABLET | Freq: Two times a day (BID) | ORAL | 0 refills | Status: DC

## 2023-11-24 ENCOUNTER — Other Ambulatory Visit: Payer: Self-pay | Admitting: Internal Medicine

## 2023-11-24 DIAGNOSIS — E119 Type 2 diabetes mellitus without complications: Secondary | ICD-10-CM

## 2023-12-04 ENCOUNTER — Encounter: Payer: Self-pay | Admitting: Internal Medicine

## 2023-12-04 ENCOUNTER — Ambulatory Visit (INDEPENDENT_AMBULATORY_CARE_PROVIDER_SITE_OTHER): Payer: Medicare HMO | Admitting: Internal Medicine

## 2023-12-04 VITALS — BP 137/81 | HR 90 | Temp 98.4°F | Ht 68.0 in | Wt 270.0 lb

## 2023-12-04 DIAGNOSIS — S0990XA Unspecified injury of head, initial encounter: Secondary | ICD-10-CM | POA: Diagnosis not present

## 2023-12-04 DIAGNOSIS — E119 Type 2 diabetes mellitus without complications: Secondary | ICD-10-CM

## 2023-12-04 DIAGNOSIS — G894 Chronic pain syndrome: Secondary | ICD-10-CM

## 2023-12-04 DIAGNOSIS — Z0130 Encounter for examination of blood pressure without abnormal findings: Secondary | ICD-10-CM

## 2023-12-04 MED ORDER — MORPHINE SULFATE ER 15 MG PO TBCR: 15.0000 mg | EXTENDED_RELEASE_TABLET | Freq: Two times a day (BID) | ORAL | 0 refills | Status: DC

## 2023-12-04 MED ORDER — PIOGLITAZONE HCL 45 MG PO TABS: 45.0000 mg | ORAL_TABLET | Freq: Every morning | ORAL | 1 refills | Status: DC

## 2023-12-04 NOTE — Progress Notes (Signed)
 Established Patient Office Visit  Subjective:  Patient ID: Yolanda Harris, female    DOB: [birth year]  Age: 68 y.o. MRN: [id number]  Chief Complaint  Patient presents with   Pain Management    Pain Management     Here for pain management follow up. Chronic pain well controlled on current analgesia. Last UDS satisfactory and pill counts have also been satisfactory. Reports another fall on 11/27/23 and struck her head without loss of consciousness but still c/o loss of balance and dizziness.     No other concerns at this time.   Past Medical History:  Diagnosis Date   Diabetes mellitus without complication (HCC)    Hypertension    Sciatic leg pain     Past Surgical History:  Procedure Laterality Date   ABDOMINAL HYSTERECTOMY     HTN      Social History   Socioeconomic History   Marital status: Married    Spouse name: Not on file   Number of children: Not on file   Years of education: Not on file   Highest education level: Not on file  Occupational History   Not on file  Tobacco Use   Smoking status: Every Day    Current packs/day: 0.50    Types: Cigarettes   Smokeless tobacco: Never  Vaping Use   Vaping status: Never Used  Substance and Sexual Activity   Alcohol  use: Not Currently   Drug use: Never   Sexual activity: Not on file  Other Topics Concern   Not on file  Social History Narrative   Not on file   Social Drivers of Health   Financial Resource Strain: Not on file  Food Insecurity: Not on file  Transportation Needs: Not on file  Physical Activity: Not on file  Stress: Not on file  Social Connections: Not on file  Intimate Partner Violence: Not on file    Family History  Problem Relation Age of Onset   Breast cancer Neg Hx     Allergies  Allergen Reactions   Amiodarone  Shortness Of Breath   Sulfa Antibiotics Rash    Outpatient Medications Prior to Visit  Medication Sig   amiodarone  (PACERONE ) 200 MG tablet Take 2 tablets by mouth  2 (two) times daily.   amitriptyline  (ELAVIL ) 25 MG tablet Take 25 mg by mouth every morning.   amLODipine -benazepril  (LOTREL) 5-20 MG capsule TAKE 1 CAPSULE EVERY DAY   apixaban  (ELIQUIS ) 5 MG TABS tablet Take 1 tablet (5 mg total) by mouth 2 (two) times daily.   atorvastatin  (LIPITOR) 10 MG tablet TAKE 1 TABLET EVERY EVENING   Blood Glucose Monitoring Suppl (TRUE METRIX AIR GLUCOSE METER) DEVI UUD   celecoxib  (CELEBREX ) 400 MG capsule Take 1 capsule (400 mg total) by mouth daily after breakfast.   cyclobenzaprine  (FLEXERIL ) 10 MG tablet TAKE 1 TABLET BY MOUTH THREE TIMES DAILY   diclofenac  Sodium (VOLTAREN ) 1 % GEL Apply 4 g topically 2 (two) times daily.   furosemide  (LASIX ) 40 MG tablet Take 1 tablet (40 mg total) by mouth daily.   insulin  degludec (TRESIBA  FLEXTOUCH) 200 UNIT/ML FlexTouch Pen Inject 110 Units into the skin daily.   meloxicam  (MOBIC ) 15 MG tablet Take 15 mg by mouth daily.   metoprolol  tartrate (LOPRESSOR ) 25 MG tablet Take 1 tablet by mouth twice daily   pregabalin  (LYRICA ) 300 MG capsule Take 1 capsule (300 mg total) by mouth 2 (two) times daily.   Zinc  Oxide 15 % CREA Apply to left leg  wound bid   [DISCONTINUED] morphine  (MS CONTIN ) 15 MG 12 hr tablet Take 1 tablet (15 mg total) by mouth 2 (two) times daily.   [DISCONTINUED] pioglitazone  (ACTOS ) 45 MG tablet TAKE 1 TABLET BY MOUTH ONCE DAILY IN THE MORNING   No facility-administered medications prior to visit.    ROS     Objective:   BP 137/81   Pulse 90   Temp 98.4 F (36.9 C)   Wt 270 lb (122.5 kg)   SpO2 96%   BMI 41.05 kg/m   Vitals:   12/04/23 1455  BP: 137/81  Pulse: 90  Temp: 98.4 F (36.9 C)  Weight: 270 lb (122.5 kg)  SpO2: 96%    Physical Exam Vitals reviewed.  Constitutional:      General: She is not in acute distress.    Appearance: She is obese.     Comments: Contusion on rear scalp still visible  HENT:     Head: Normocephalic and atraumatic.     Nose: Nose normal.      Mouth/Throat:     Mouth: Mucous membranes are moist.  Eyes:     Extraocular Movements: Extraocular movements intact.     Pupils: Pupils are equal, round, and reactive to light.  Cardiovascular:     Rate and Rhythm: Normal rate and regular rhythm.     Heart sounds: No murmur heard. Pulmonary:     Effort: Pulmonary effort is normal.     Breath sounds: No rhonchi or rales.  Abdominal:     General: Abdomen is flat.     Palpations: There is no hepatomegaly, splenomegaly or mass.  Musculoskeletal:        General: No swelling. Normal range of motion.     Cervical back: Normal range of motion. No tenderness.     Right lower leg: Edema present.     Left lower leg: Edema present.     Left foot: Normal.     Comments: Left soft shoe  Skin:    General: Skin is warm and dry.     Findings: Wound present.     Comments: LLE, superficial clean base with granulating tissue  Neurological:     General: No focal deficit present.     Mental Status: She is alert and oriented to person, place, and time.     Cranial Nerves: No cranial nerve deficit.     Motor: No weakness.  Psychiatric:        Mood and Affect: Mood normal.        Behavior: Behavior normal.      No results found for any visits on 12/04/23.  Recent Results (from the past 2160 hours)  Comprehensive metabolic panel     Status: Abnormal   Collection Time: 10/12/23 10:38 AM  Result Value Ref Range   Glucose 145 (H) 70 - 99 mg/dL   BUN 8 8 - 27 mg/dL   Creatinine, Ser 9.33 0.57 - 1.00 mg/dL   eGFR 95 >40 fO/fpw/8.26   BUN/Creatinine Ratio 12 12 - 28   Sodium 144 134 - 144 mmol/L   Potassium 4.5 3.5 - 5.2 mmol/L   Chloride 105 96 - 106 mmol/L   CO2 26 20 - 29 mmol/L   Calcium  9.0 8.7 - 10.3 mg/dL   Total Protein 6.9 6.0 - 8.5 g/dL   Albumin  3.2 (L) 3.9 - 4.9 g/dL   Globulin, Total 3.7 1.5 - 4.5 g/dL   Bilirubin Total 1.0 0.0 - 1.2 mg/dL   Alkaline  Phosphatase 223 (H) 44 - 121 IU/L   AST 30 0 - 40 IU/L   ALT 18 0 - 32 IU/L   Lipid panel     Status: Abnormal   Collection Time: 10/12/23 10:41 AM  Result Value Ref Range   Cholesterol, Total 164 100 - 199 mg/dL   Triglycerides 830 (H) 0 - 149 mg/dL   HDL 47 >60 mg/dL   VLDL Cholesterol Cal 29 5 - 40 mg/dL   LDL Chol Calc (NIH) 88 0 - 99 mg/dL   Chol/HDL Ratio 3.5 0.0 - 4.4 ratio    Comment:                                   T. Chol/HDL Ratio                                             Men  Women                               1/2 Avg.Risk  3.4    3.3                                   Avg.Risk  5.0    4.4                                2X Avg.Risk  9.6    7.1                                3X Avg.Risk 23.4   11.0   CBC With Diff/Platelet     Status: Abnormal   Collection Time: 10/12/23 10:45 AM  Result Value Ref Range   WBC 7.7 3.4 - 10.8 x10E3/uL    Comment: **Effective October 23, 2023 profile [id number] WBC will be made**   non-orderable as a stand-alone order code.    RBC 4.21 3.77 - 5.28 x10E6/uL    Comment: Target cells present. Polychromasia present    Hemoglobin 13.1 11.1 - 15.9 g/dL   Hematocrit 60.8 65.9 - 46.6 %   MCV 93 79 - 97 fL   MCH 31.1 26.6 - 33.0 pg   MCHC 33.5 31.5 - 35.7 g/dL   RDW 84.2 (H) 88.2 - 84.5 %   Platelets 89 (LL) 150 - 450 x10E3/uL   Neutrophils 36 Not Estab. %   Lymphs 44 Not Estab. %   Monocytes 12 Not Estab. %   Eos 7 Not Estab. %   Basos 1 Not Estab. %   Neutrophils Absolute 2.8 1.4 - 7.0 x10E3/uL   Lymphocytes Absolute 3.3 (H) 0.7 - 3.1 x10E3/uL   Monocytes Absolute 0.9 0.1 - 0.9 x10E3/uL   EOS (ABSOLUTE) 0.5 (H) 0.0 - 0.4 x10E3/uL   Basophils Absolute 0.1 0.0 - 0.2 x10E3/uL   Immature Granulocytes 0 Not Estab. %   Immature Grans (Abs) 0.0 0.0 - 0.1 x10E3/uL   Hematology Comments: Note:     Comment: Verified by microscopic examination.  Hemoglobin A1c     Status: Abnormal   Collection Time: 10/12/23  10:47 AM  Result Value Ref Range   Hgb A1c MFr Bld 8.4 (H) 4.8 - 5.6 %    Comment:          Prediabetes: 5.7  - 6.4          Diabetes: >6.4          Glycemic control for adults with diabetes: <7.0    Est. average glucose Bld gHb Est-mCnc 194 mg/dL  POCT CBG (Fasting - Glucose)     Status: Abnormal   Collection Time: 10/18/23 12:27 PM  Result Value Ref Range   Glucose Fasting, POC 133 (A) 70 - 99 mg/dL      Assessment & Plan:  As per problem list.  Problem List Items Addressed This Visit       Endocrine   Diabetes mellitus without complication (HCC)   Relevant Medications   pioglitazone  (ACTOS ) 45 MG tablet     Other   Chronic pain syndrome   Relevant Medications   morphine  (MS CONTIN ) 15 MG 12 hr tablet   Other Relevant Orders   Drug Screen 13 with reflex Confirmation (AMP,BAR,BZO,COC,PCP,THC,OPI,OXY,MD,FEN,MEP,PPX,TRAM), Serum   Other Visit Diagnoses       Injury of head, initial encounter    -  Primary   Relevant Orders   CT HEAD WO CONTRAST ( )       Return in about 2 months (around 02/01/2024) for fu with labs prior, Pain Management.   Total time spent: 30 minutes  Sherrill Cinderella Perry, MD  12/04/2023   This document may have been prepared by [facility] Voice Recognition software and as such may include unintentional dictation errors.

## 2023-12-05 ENCOUNTER — Ambulatory Visit: Payer: Medicare HMO

## 2023-12-05 ENCOUNTER — Other Ambulatory Visit: Payer: Self-pay

## 2023-12-05 DIAGNOSIS — M25561 Pain in right knee: Secondary | ICD-10-CM

## 2023-12-05 DIAGNOSIS — M25562 Pain in left knee: Secondary | ICD-10-CM

## 2023-12-08 LAB — LIPID PANEL
Chol/HDL Ratio: 4.6 {ratio} — ABNORMAL HIGH (ref 0.0–4.4)
Cholesterol, Total: 162 mg/dL (ref 100–199)
HDL: 35 mg/dL — ABNORMAL LOW (ref >39)
LDL Chol Calc (NIH): 94 mg/dL (ref 0–99)
Triglycerides: 190 mg/dL — ABNORMAL HIGH (ref 0–149)
VLDL Cholesterol Cal: 33 mg/dL (ref 5–40)

## 2023-12-08 LAB — DRUG SCREEN 13 W/CONF , SERUM
Amphetamines, IA: NEGATIVE ng/mL
Barbiturates, IA: NEGATIVE ug/mL
Benzodiazepines, IA: NEGATIVE ng/mL
Cocaine & Metabolite, IA: NEGATIVE ng/mL
FENTANYL, IA: NEGATIVE ng/mL
MEPERIDINE, IA: NEGATIVE ng/mL
Methadone, IA: NEGATIVE ng/mL
Phencyclidine, IA: NEGATIVE ng/mL
Propoxyphene, IA: NEGATIVE ng/mL
THC(Marijuana) Metabolite, IA: NEGATIVE ng/mL
TRAMADOL, IA: NEGATIVE ng/mL

## 2023-12-08 LAB — COMPREHENSIVE METABOLIC PANEL
ALT: 22 [IU]/L (ref 0–32)
AST: 44 [IU]/L — ABNORMAL HIGH (ref 0–40)
Albumin: 3.4 g/dL — ABNORMAL LOW (ref 3.9–4.9)
Alkaline Phosphatase: 217 [IU]/L — ABNORMAL HIGH (ref 44–121)
BUN/Creatinine Ratio: 15 (ref 12–28)
BUN: 10 mg/dL (ref 8–27)
Bilirubin Total: 1.7 mg/dL — ABNORMAL HIGH (ref 0.0–1.2)
CO2: 23 mmol/L (ref 20–29)
Calcium: 8.6 mg/dL — ABNORMAL LOW (ref 8.7–10.3)
Chloride: 103 mmol/L (ref 96–106)
Creatinine, Ser: 0.65 mg/dL (ref 0.57–1.00)
Globulin, Total: 4 g/dL (ref 1.5–4.5)
Glucose: 294 mg/dL — ABNORMAL HIGH (ref 70–99)
Potassium: 5.2 mmol/L (ref 3.5–5.2)
Sodium: 138 mmol/L (ref 134–144)
Total Protein: 7.4 g/dL (ref 6.0–8.5)
eGFR: 95 mL/min/{1.73_m2} (ref >59)

## 2023-12-08 LAB — OPIATES,MS,WB/SP RFX

## 2023-12-08 LAB — FRUCTOSAMINE: Fructosamine: 390 umol/L — ABNORMAL HIGH (ref 0–285)

## 2023-12-08 LAB — OXYCODONES,MS,WB/SP RFX

## 2023-12-11 NOTE — Progress Notes (Signed)
Informed via Mychart message

## 2023-12-12 ENCOUNTER — Other Ambulatory Visit: Payer: Medicare HMO

## 2023-12-14 ENCOUNTER — Other Ambulatory Visit: Payer: Self-pay

## 2023-12-21 ENCOUNTER — Other Ambulatory Visit: Payer: Self-pay | Admitting: Internal Medicine

## 2023-12-26 ENCOUNTER — Ambulatory Visit: Payer: Medicare HMO

## 2023-12-26 DIAGNOSIS — S0990XA Unspecified injury of head, initial encounter: Secondary | ICD-10-CM

## 2024-01-01 ENCOUNTER — Other Ambulatory Visit: Payer: Self-pay

## 2024-01-01 DIAGNOSIS — G894 Chronic pain syndrome: Secondary | ICD-10-CM

## 2024-01-02 ENCOUNTER — Encounter: Payer: Self-pay | Admitting: Internal Medicine

## 2024-01-02 ENCOUNTER — Other Ambulatory Visit: Payer: Self-pay

## 2024-01-02 ENCOUNTER — Other Ambulatory Visit: Payer: Self-pay | Admitting: Internal Medicine

## 2024-01-02 DIAGNOSIS — D329 Benign neoplasm of meninges, unspecified: Secondary | ICD-10-CM

## 2024-01-02 DIAGNOSIS — G894 Chronic pain syndrome: Secondary | ICD-10-CM

## 2024-01-02 MED ORDER — MORPHINE SULFATE ER 15 MG PO TBCR: 15.0000 mg | EXTENDED_RELEASE_TABLET | Freq: Two times a day (BID) | ORAL | 0 refills | Status: DC

## 2024-01-02 NOTE — Progress Notes (Signed)
Informed via Mychart message

## 2024-01-05 ENCOUNTER — Ambulatory Visit: Payer: Medicare HMO | Admitting: Internal Medicine

## 2024-01-05 ENCOUNTER — Other Ambulatory Visit: Payer: Self-pay | Admitting: Internal Medicine

## 2024-01-05 MED ORDER — CELECOXIB 400 MG PO CAPS: 400.0000 mg | ORAL_CAPSULE | Freq: Every day | ORAL | 1 refills | Status: DC

## 2024-01-05 MED ORDER — BD SWAB SINGLE USE REGULAR PADS: MEDICATED_PAD | 1 refills | Status: DC

## 2024-01-05 MED ORDER — METOPROLOL TARTRATE 25 MG PO TABS: 25.0000 mg | ORAL_TABLET | Freq: Two times a day (BID) | ORAL | 1 refills | Status: DC

## 2024-01-05 MED ORDER — CYCLOBENZAPRINE HCL 10 MG PO TABS: 10.0000 mg | ORAL_TABLET | Freq: Three times a day (TID) | ORAL | 0 refills | Status: DC

## 2024-01-05 MED ORDER — TRUE METRIX AIR GLUCOSE METER DEVI: 0 refills | Status: DC

## 2024-01-05 MED ORDER — TRUEPLUS LANCETS 33G MISC: 1 refills | Status: DC

## 2024-01-05 MED ORDER — RELION TRUE METRIX TEST STRIPS VI STRP: ORAL_STRIP | 2 refills | Status: DC

## 2024-01-05 MED ORDER — MELOXICAM 15 MG PO TABS: 15.0000 mg | ORAL_TABLET | Freq: Every day | ORAL | 0 refills | Status: DC

## 2024-01-09 ENCOUNTER — Other Ambulatory Visit: Payer: Self-pay | Admitting: Internal Medicine

## 2024-01-11 ENCOUNTER — Other Ambulatory Visit: Payer: Self-pay

## 2024-01-16 ENCOUNTER — Telehealth: Payer: Self-pay

## 2024-01-16 DIAGNOSIS — G894 Chronic pain syndrome: Secondary | ICD-10-CM

## 2024-01-16 NOTE — Addendum Note (Signed)
 Addended by: Aundria Mems AHMAD on: 01/16/2024 04:06 PM   Modules accepted: Orders

## 2024-01-16 NOTE — Telephone Encounter (Signed)
 Patient needs order for UDS last one was cancelled

## 2024-01-31 ENCOUNTER — Encounter: Payer: Self-pay | Admitting: Internal Medicine

## 2024-01-31 ENCOUNTER — Ambulatory Visit (INDEPENDENT_AMBULATORY_CARE_PROVIDER_SITE_OTHER): Payer: Medicare HMO | Admitting: Internal Medicine

## 2024-01-31 VITALS — BP 130/76 | HR 79 | Temp 97.5°F | Ht 68.0 in | Wt 272.0 lb

## 2024-01-31 DIAGNOSIS — G894 Chronic pain syndrome: Secondary | ICD-10-CM

## 2024-01-31 DIAGNOSIS — Z8679 Personal history of other diseases of the circulatory system: Secondary | ICD-10-CM | POA: Diagnosis not present

## 2024-01-31 DIAGNOSIS — E119 Type 2 diabetes mellitus without complications: Secondary | ICD-10-CM

## 2024-01-31 DIAGNOSIS — Z0130 Encounter for examination of blood pressure without abnormal findings: Secondary | ICD-10-CM

## 2024-01-31 LAB — POCT CBG (FASTING - GLUCOSE)-MANUAL ENTRY: Glucose Fasting, POC: 113 mg/dL — AB (ref 70–99)

## 2024-01-31 MED ORDER — PREGABALIN 300 MG PO CAPS: 300.0000 mg | ORAL_CAPSULE | Freq: Two times a day (BID) | ORAL | 3 refills | Status: DC

## 2024-01-31 MED ORDER — MORPHINE SULFATE ER 15 MG PO TBCR: 15.0000 mg | EXTENDED_RELEASE_TABLET | Freq: Two times a day (BID) | ORAL | 0 refills | Status: DC

## 2024-01-31 NOTE — Progress Notes (Signed)
 Established Patient Office Visit  Subjective:  Patient ID: Yolanda Harris, female    DOB: 1956/02/28  Age: 67 y.o. MRN: [id number]  Chief Complaint  Patient presents with   Pain Management    Here for pain management follow up. Chronic pain well controlled on current analgesia. Last drug screen satisfactory and pill counts have also been satisfactory.     No other concerns at this time.   Past Medical History:  Diagnosis Date   Diabetes mellitus without complication (HCC)    Hypertension    Sciatic leg pain     Past Surgical History:  Procedure Laterality Date   ABDOMINAL HYSTERECTOMY     HTN      Social History   Socioeconomic History   Marital status: Married    Spouse name: Not on file   Number of children: Not on file   Years of education: Not on file   Highest education level: Not on file  Occupational History   Not on file  Tobacco Use   Smoking status: Every Day    Current packs/day: 0.50    Types: Cigarettes   Smokeless tobacco: Never  Vaping Use   Vaping status: Never Used  Substance and Sexual Activity   Alcohol use: Not Currently   Drug use: Never   Sexual activity: Not on file  Other Topics Concern   Not on file  Social History Narrative   Not on file   Social Drivers of Health   Financial Resource Strain: Not on file  Food Insecurity: Not on file  Transportation Needs: Not on file  Physical Activity: Not on file  Stress: Not on file  Social Connections: Not on file  Intimate Partner Violence: Not on file    Family History  Problem Relation Age of Onset   Breast cancer Neg Hx     Allergies  Allergen Reactions   Amiodarone Shortness Of Breath   Sulfa Antibiotics Rash    Outpatient Medications Prior to Visit  Medication Sig   Alcohol Swabs (B-D SINGLE USE SWABS REGULAR) PADS Use to clean finger before sticking to check sugar   amiodarone (PACERONE) 200 MG tablet Take 2 tablets by mouth 2 (two) times daily.    amitriptyline (ELAVIL) 25 MG tablet TAKE 1 TABLET EVERY MORNING   amLODipine-benazepril (LOTREL) 5-20 MG capsule TAKE 1 CAPSULE EVERY DAY   atorvastatin (LIPITOR) 10 MG tablet TAKE 1 TABLET EVERY EVENING   celecoxib (CELEBREX) 400 MG capsule Take 1 capsule (400 mg total) by mouth daily after breakfast.   cyclobenzaprine (FLEXERIL) 10 MG tablet Take 1 tablet (10 mg total) by mouth 3 (three) times daily.   furosemide (LASIX) 40 MG tablet Take 1 tablet (40 mg total) by mouth daily.   glucose blood (RELION TRUE METRIX TEST STRIPS) test strip Use with device to check sugars  up to three times daily   insulin degludec (TRESIBA FLEXTOUCH) 200 UNIT/ML FlexTouch Pen Inject 110 Units into the skin daily.   meloxicam (MOBIC) 15 MG tablet Take 1 tablet (15 mg total) by mouth daily.   metoprolol tartrate (LOPRESSOR) 25 MG tablet Take 1 tablet (25 mg total) by mouth 2 (two) times daily.   morphine (MS CONTIN) 15 MG 12 hr tablet Take 1 tablet (15 mg total) by mouth 2 (two) times daily.   pioglitazone (ACTOS) 45 MG tablet Take 1 tablet (45 mg total) by mouth every morning.   pregabalin (LYRICA) 300 MG capsule Take 1 capsule (300 mg total) by  mouth 2 (two) times daily.   Semaglutide, 2 MG/DOSE, (OZEMPIC, 2 MG/DOSE,) 8 MG/3ML SOPN INJECT 2 MG UNDER THE SKIN ONCE WEEKLY   TRUEplus Lancets 33G MISC Use with device to check sugars up to 3 times daily   Zinc Oxide 15 % CREA Apply to left leg wound bid   apixaban (ELIQUIS) 5 MG TABS tablet Take 1 tablet (5 mg total) by mouth 2 (two) times daily. (Patient not taking: Reported on 01/31/2024)   Blood Glucose Monitoring Suppl (TRUE METRIX AIR GLUCOSE METER) DEVI UUD (Patient not taking: Reported on 01/31/2024)   No facility-administered medications prior to visit.    Review of Systems  Constitutional: Negative.   HENT: Negative.    Eyes: Negative.   Respiratory: Negative.    Cardiovascular: Negative.   Gastrointestinal: Negative.   Genitourinary: Negative.    Musculoskeletal:  Positive for joint pain (bilat knee pain worse on the right).  Skin: Negative.   Neurological: Negative.        Poor balance  Endo/Heme/Allergies: Negative.   Psychiatric/Behavioral: Negative.    All other systems reviewed and are negative.      Objective:   BP 130/76   Pulse 79   Temp (!) 97.5 F (36.4 C) (Tympanic)   Ht 5\' 8"  (1.727 m)   Wt 272 lb (123.4 kg)   SpO2 97%   BMI 41.36 kg/m   Vitals:   01/31/24 0942  BP: 130/76  Pulse: 79  Temp: (!) 97.5 F (36.4 C)  Height: 5\' 8"  (1.727 m)  Weight: 272 lb (123.4 kg)  SpO2: 97%  TempSrc: Tympanic  BMI (Calculated): 41.37    Physical Exam Vitals reviewed.  Constitutional:      General: She is not in acute distress.    Appearance: She is obese.     Comments: Contusion on rear scalp still visible  HENT:     Head: Normocephalic and atraumatic.     Nose: Nose normal.     Mouth/Throat:     Mouth: Mucous membranes are moist.  Eyes:     Extraocular Movements: Extraocular movements intact.     Pupils: Pupils are equal, round, and reactive to light.  Cardiovascular:     Rate and Rhythm: Normal rate and regular rhythm.     Heart sounds: No murmur heard. Pulmonary:     Effort: Pulmonary effort is normal.     Breath sounds: No rhonchi or rales.  Abdominal:     General: Abdomen is flat.     Palpations: There is no hepatomegaly, splenomegaly or mass.  Musculoskeletal:        General: No swelling. Normal range of motion.     Cervical back: Normal range of motion. No tenderness.     Right lower leg: Edema present.     Left lower leg: Edema present.     Left foot: Normal.     Comments: Left soft shoe  Skin:    General: Skin is warm and dry.     Findings: Wound present.     Comments: LLE, superficial clean base with granulating tissue  Neurological:     General: No focal deficit present.     Mental Status: She is alert and oriented to person, place, and time.     Cranial Nerves: No cranial nerve  deficit.     Motor: No weakness.  Psychiatric:        Mood and Affect: Mood normal.        Behavior: Behavior normal.  Results for orders placed or performed in visit on 01/31/24  POCT CBG (Fasting - Glucose)  Result Value Ref Range   Glucose Fasting, POC 113 (A) 70 - 99 mg/dL    Recent Results (from the past 2160 hours)  Fructosamine     Status: Abnormal   Collection Time: 12/04/23  4:05 PM  Result Value Ref Range   Fructosamine 390 (H) 0 - 285 umol/L    Comment: Published reference interval for apparently healthy subjects between age 34 and 28 is 19 - 285 umol/L and in a poorly controlled diabetic population is 228 - 563 umol/L with a mean of 396 umol/L.   Comprehensive metabolic panel     Status: Abnormal   Collection Time: 12/04/23  4:05 PM  Result Value Ref Range   Glucose 294 (H) 70 - 99 mg/dL   BUN 10 8 - 27 mg/dL   Creatinine, Ser 6.96 0.57 - 1.00 mg/dL   eGFR 95 >29 BM/WUX/3.24   BUN/Creatinine Ratio 15 12 - 28   Sodium 138 134 - 144 mmol/L   Potassium 5.2 3.5 - 5.2 mmol/L   Chloride 103 96 - 106 mmol/L   CO2 23 20 - 29 mmol/L   Calcium 8.6 (L) 8.7 - 10.3 mg/dL   Total Protein 7.4 6.0 - 8.5 g/dL   Albumin 3.4 (L) 3.9 - 4.9 g/dL   Globulin, Total 4.0 1.5 - 4.5 g/dL   Bilirubin Total 1.7 (H) 0.0 - 1.2 mg/dL   Alkaline Phosphatase 217 (H) 44 - 121 IU/L   AST 44 (H) 0 - 40 IU/L   ALT 22 0 - 32 IU/L  Drug Screen 13 with reflex Confirmation (AMP,BAR,BZO,COC,PCP,THC,OPI,OXY,MD,FEN,MEP,PPX,TRAM), Serum     Status: None   Collection Time: 12/04/23  4:05 PM  Result Value Ref Range   Amphetamines, IA Negative Cutoff:50 ng/mL   Barbiturates, IA Negative Cutoff:0.1 ug/mL   Benzodiazepines, IA Negative Cutoff:20 ng/mL   Cocaine & Metabolite, IA Negative Cutoff:25 ng/mL   Phencyclidine, IA Negative Cutoff:8 ng/mL   THC(Marijuana) Metabolite, IA Negative Cutoff:5 ng/mL   Opiates, IA CANCELED ng/mL    Comment: Test not performed Insufficient sample volume to  complete testing.  Result canceled by the ancillary.    Oxycodones, IA CANCELED ng/mL    Comment: Test not performed Insufficient sample volume to complete testing.  Result canceled by the ancillary.    Methadone, IA Negative Cutoff:25 ng/mL   FENTANYL, IA Negative Cutoff:1.0 ng/mL   Propoxyphene, IA Negative Cutoff:50 ng/mL   MEPERIDINE, IA Negative Cutoff:100 ng/mL   TRAMADOL, IA Negative Cutoff:50 ng/mL  Lipid panel     Status: Abnormal   Collection Time: 12/04/23  4:05 PM  Result Value Ref Range   Cholesterol, Total 162 100 - 199 mg/dL   Triglycerides 401 (H) 0 - 149 mg/dL   HDL 35 (L) >02 mg/dL   VLDL Cholesterol Cal 33 5 - 40 mg/dL   LDL Chol Calc (NIH) 94 0 - 99 mg/dL   Chol/HDL Ratio 4.6 (H) 0.0 - 4.4 ratio    Comment:                                   T. Chol/HDL Ratio  Men  Women                               1/2 Avg.Risk  3.4    3.3                                   Avg.Risk  5.0    4.4                                2X Avg.Risk  9.6    7.1                                3X Avg.Risk 23.4   11.0   Opiates,MS,WB/Sp Rfx     Status: None   Collection Time: 12/04/23  4:05 PM  Result Value Ref Range   Opiate Confirmation CANCELED     Comment: Test not performed Insufficient sample volume to complete testing.  Result canceled by the ancillary.    Codeine CANCELED ng/mL    Comment: Test not performed Insufficient sample volume to complete testing.  Result canceled by the ancillary.    Morphine CANCELED ng/mL    Comment: Test not performed Insufficient sample volume to complete testing.  Result canceled by the ancillary.    6-Acetylmorphine CANCELED     Comment: Test not performed Insufficient sample volume to complete testing.  Result canceled by the ancillary.    Hydrocodone CANCELED ng/mL    Comment: Test not performed Insufficient sample volume to complete testing.  Result canceled by the ancillary.     Hydromorphone CANCELED ng/mL    Comment: Test not performed Insufficient sample volume to complete testing.  Result canceled by the ancillary.    Dihydrocodeine CANCELED ng/mL    Comment: Test not performed Insufficient sample volume to complete testing.  Result canceled by the ancillary.   Oxycodones,MS,WB/Sp Rfx     Status: None   Collection Time: 12/04/23  4:05 PM  Result Value Ref Range   Oxycodones Confirmation CANCELED     Comment: Test not performed Insufficient sample volume to complete testing.  Result canceled by the ancillary.    Oxycocone CANCELED ng/mL    Comment: Test not performed Insufficient sample volume to complete testing.  Result canceled by the ancillary.    Oxymorphone CANCELED ng/mL    Comment: Test not performed Insufficient sample volume to complete testing.  Result canceled by the ancillary.   POCT CBG (Fasting - Glucose)     Status: Abnormal   Collection Time: 01/31/24 10:00 AM  Result Value Ref Range   Glucose Fasting, POC 113 (A) 70 - 99 mg/dL      Assessment & Plan:  As per problem list  Problem List Items Addressed This Visit       Endocrine   Diabetes mellitus without complication (HCC) - Primary   Relevant Orders   POCT CBG (Fasting - Glucose) (Completed)    No follow-ups on file.   Total time spent: 20 minutes  Luna Fuse, MD  01/31/2024   This document may have been prepared by [facility] Voice Recognition software and as such may include unintentional dictation errors.

## 2024-01-31 NOTE — Addendum Note (Signed)
 Addended by: Aundria Mems AHMAD on: 01/31/2024 10:19 AM   Modules accepted: Orders

## 2024-02-06 ENCOUNTER — Telehealth: Payer: Self-pay

## 2024-02-06 NOTE — Telephone Encounter (Signed)
 Patient daughter called asking for call back states her she took all her rx and then she was out of it but did not specify what medication and other symptoms, will call daughter back to get more information.   [phone]

## 2024-02-08 ENCOUNTER — Other Ambulatory Visit: Payer: Self-pay

## 2024-02-08 DIAGNOSIS — R600 Localized edema: Secondary | ICD-10-CM

## 2024-02-09 NOTE — Telephone Encounter (Signed)
 Patients daughter called and an appointment made-Betty

## 2024-02-12 MED ORDER — FUROSEMIDE 40 MG PO TABS
40.0000 mg | ORAL_TABLET | Freq: Every day | ORAL | 0 refills | Status: DC
Start: 2024-02-12 — End: 2024-07-29

## 2024-02-13 ENCOUNTER — Encounter: Payer: Self-pay | Admitting: Cardiology

## 2024-02-13 ENCOUNTER — Ambulatory Visit (INDEPENDENT_AMBULATORY_CARE_PROVIDER_SITE_OTHER): Admitting: Cardiology

## 2024-02-13 ENCOUNTER — Other Ambulatory Visit: Payer: Self-pay | Admitting: Cardiology

## 2024-02-13 VITALS — BP 124/64 | HR 98 | Ht 68.0 in | Wt 270.0 lb

## 2024-02-13 DIAGNOSIS — R8290 Unspecified abnormal findings in urine: Secondary | ICD-10-CM | POA: Diagnosis not present

## 2024-02-13 DIAGNOSIS — Z0130 Encounter for examination of blood pressure without abnormal findings: Secondary | ICD-10-CM

## 2024-02-13 DIAGNOSIS — E119 Type 2 diabetes mellitus without complications: Secondary | ICD-10-CM | POA: Diagnosis not present

## 2024-02-13 LAB — POCT URINALYSIS DIPSTICK
Bilirubin, UA: NEGATIVE
Blood, UA: NEGATIVE
Glucose, UA: POSITIVE — AB
Ketones, UA: NEGATIVE
Leukocytes, UA: NEGATIVE
Nitrite, UA: NEGATIVE
Protein, UA: POSITIVE — AB
Spec Grav, UA: >=1.03 — AB (ref 1.010–1.025)
Urobilinogen, UA: 0.2 U/dL
pH, UA: 5.5 (ref 5.0–8.0)

## 2024-02-13 NOTE — Progress Notes (Unsigned)
 Established Patient Office Visit  Subjective:  Patient ID: Yolanda Harris, female    DOB: [birth year]  Age: 68 y.o. MRN: [id number]  Chief Complaint  Patient presents with   Acute Visit    Referral Endo, daughter concerned    Patient in office for an acute visit, daughter is concerned about patient's behavior. Daughter is concerned about patient's diabetes, requesting a referral to endocrinology.  Daughter states patient had one episode of acting confused, blood sugar was 119. Will get a UA today.  Has not seen cardiology. Patient reports stopping Eliquis on her own. Discussed risks of stopping Eliquis, increased risk of stroke. Recommend following up with Dr. Welton Flakes.     No other concerns at this time.   Past Medical History:  Diagnosis Date   Diabetes mellitus without complication (HCC)    Hypertension    Sciatic leg pain     Past Surgical History:  Procedure Laterality Date   ABDOMINAL HYSTERECTOMY     HTN      Social History   Socioeconomic History   Marital status: Married    Spouse name: Not on file   Number of children: Not on file   Years of education: Not on file   Highest education level: Not on file  Occupational History   Not on file  Tobacco Use   Smoking status: Every Day    Current packs/day: 0.50    Types: Cigarettes   Smokeless tobacco: Never  Vaping Use   Vaping status: Never Used  Substance and Sexual Activity   Alcohol use: Not Currently   Drug use: Never   Sexual activity: Not on file  Other Topics Concern   Not on file  Social History Narrative   Not on file   Social Drivers of Health   Financial Resource Strain: Not on file  Food Insecurity: Not on file  Transportation Needs: Not on file  Physical Activity: Not on file  Stress: Not on file  Social Connections: Not on file  Intimate Partner Violence: Not on file    Family History  Problem Relation Age of Onset   Breast cancer Neg Hx     Allergies  Allergen Reactions    Amiodarone Shortness Of Breath   Sulfa Antibiotics Rash    Outpatient Medications Prior to Visit  Medication Sig   Alcohol Swabs (B-D SINGLE USE SWABS REGULAR) PADS Use to clean finger before sticking to check sugar   amiodarone (PACERONE) 200 MG tablet Take 2 tablets by mouth 2 (two) times daily.   amitriptyline (ELAVIL) 25 MG tablet TAKE 1 TABLET EVERY MORNING   amLODipine-benazepril (LOTREL) 5-20 MG capsule TAKE 1 CAPSULE EVERY DAY   atorvastatin (LIPITOR) 10 MG tablet TAKE 1 TABLET EVERY EVENING   Blood Glucose Monitoring Suppl (TRUE METRIX AIR GLUCOSE METER) DEVI UUD (Patient not taking: Reported on 01/31/2024)   celecoxib (CELEBREX) 400 MG capsule Take 1 capsule (400 mg total) by mouth daily after breakfast.   cyclobenzaprine (FLEXERIL) 10 MG tablet Take 1 tablet (10 mg total) by mouth 3 (three) times daily.   furosemide (LASIX) 40 MG tablet Take 1 tablet (40 mg total) by mouth daily.   glucose blood (RELION TRUE METRIX TEST STRIPS) test strip Use with device to check sugars  up to three times daily   insulin degludec (TRESIBA FLEXTOUCH) 200 UNIT/ML FlexTouch Pen Inject 110 Units into the skin daily.   meloxicam (MOBIC) 15 MG tablet Take 1 tablet (15 mg total) by mouth daily.  metoprolol tartrate (LOPRESSOR) 25 MG tablet Take 1 tablet (25 mg total) by mouth 2 (two) times daily.   morphine (MS CONTIN) 15 MG 12 hr tablet Take 1 tablet (15 mg total) by mouth 2 (two) times daily.   [START ON 03/02/2024] morphine (MS CONTIN) 15 MG 12 hr tablet Take 1 tablet (15 mg total) by mouth 2 (two) times daily.   pioglitazone (ACTOS) 45 MG tablet Take 1 tablet (45 mg total) by mouth every morning.   pregabalin (LYRICA) 300 MG capsule Take 1 capsule (300 mg total) by mouth 2 (two) times daily.   Semaglutide, 2 MG/DOSE, (OZEMPIC, 2 MG/DOSE,) 8 MG/3ML SOPN INJECT 2 MG UNDER THE SKIN ONCE WEEKLY   TRUEplus Lancets 33G MISC Use with device to check sugars up to 3 times daily   Zinc Oxide 15 % CREA Apply  to left leg wound bid   No facility-administered medications prior to visit.    Review of Systems  Constitutional: Negative.   HENT: Negative.    Eyes: Negative.   Respiratory: Negative.  Negative for shortness of breath.   Cardiovascular: Negative.  Negative for chest pain.  Gastrointestinal: Negative.  Negative for abdominal pain, constipation and diarrhea.  Genitourinary: Negative.   Musculoskeletal:  Negative for joint pain and myalgias.  Skin: Negative.   Neurological: Negative.  Negative for dizziness and headaches.  Endo/Heme/Allergies: Negative.   Psychiatric/Behavioral:  Negative for depression. The patient is not nervous/anxious.   All other systems reviewed and are negative.      Objective:   BP 124/64   Pulse 98   Ht 5\' 8"  (1.727 m)   Wt 270 lb (122.5 kg)   SpO2 94%   BMI 41.05 kg/m   Vitals:   02/13/24 1435  BP: 124/64  Pulse: 98  Height: 5\' 8"  (1.727 m)  Weight: 270 lb (122.5 kg)  SpO2: 94%  BMI (Calculated): 41.06    Physical Exam Vitals and nursing note reviewed.  Constitutional:      Appearance: Normal appearance. She is normal weight.  HENT:     Head: Normocephalic and atraumatic.     Nose: Nose normal.     Mouth/Throat:     Mouth: Mucous membranes are moist.  Eyes:     Extraocular Movements: Extraocular movements intact.     Conjunctiva/sclera: Conjunctivae normal.     Pupils: Pupils are equal, round, and reactive to light.  Cardiovascular:     Rate and Rhythm: Normal rate and regular rhythm.     Pulses: Normal pulses.     Heart sounds: Normal heart sounds.  Pulmonary:     Effort: Pulmonary effort is normal.     Breath sounds: Normal breath sounds.  Abdominal:     General: Abdomen is flat. Bowel sounds are normal.     Palpations: Abdomen is soft.  Musculoskeletal:        General: Normal range of motion.     Cervical back: Normal range of motion.  Skin:    General: Skin is warm and dry.  Neurological:     General: No focal  deficit present.     Mental Status: She is alert and oriented to person, place, and time.  Psychiatric:        Mood and Affect: Mood normal.        Behavior: Behavior normal.        Thought Content: Thought content normal.        Judgment: Judgment normal.      Results for  orders placed or performed in visit on 02/13/24  POCT Urinalysis Dipstick (81002)  Result Value Ref Range   Color, UA     Clarity, UA     Glucose, UA Positive (A) Negative   Bilirubin, UA Negative    Ketones, UA Negative    Spec Grav, UA >=1.030 (A) 1.010 - 1.025   Blood, UA Negative    pH, UA 5.5 5.0 - 8.0   Protein, UA Positive (A) Negative   Urobilinogen, UA 0.2 0.2 or 1.0 E.U./dL   Nitrite, UA Negative    Leukocytes, UA Negative Negative   Appearance     Odor      Recent Results (from the past 2160 hours)  Fructosamine     Status: Abnormal   Collection Time: 12/04/23  4:05 PM  Result Value Ref Range   Fructosamine 390 (H) 0 - 285 umol/L    Comment: Published reference interval for apparently healthy subjects between age 72 and 42 is 76 - 285 umol/L and in a poorly controlled diabetic population is 228 - 563 umol/L with a mean of 396 umol/L.   Comprehensive metabolic panel     Status: Abnormal   Collection Time: 12/04/23  4:05 PM  Result Value Ref Range   Glucose 294 (H) 70 - 99 mg/dL   BUN 10 8 - 27 mg/dL   Creatinine, Ser 7.82 0.57 - 1.00 mg/dL   eGFR 95 >95 AO/ZHY/8.65   BUN/Creatinine Ratio 15 12 - 28   Sodium 138 134 - 144 mmol/L   Potassium 5.2 3.5 - 5.2 mmol/L   Chloride 103 96 - 106 mmol/L   CO2 23 20 - 29 mmol/L   Calcium 8.6 (L) 8.7 - 10.3 mg/dL   Total Protein 7.4 6.0 - 8.5 g/dL   Albumin 3.4 (L) 3.9 - 4.9 g/dL   Globulin, Total 4.0 1.5 - 4.5 g/dL   Bilirubin Total 1.7 (H) 0.0 - 1.2 mg/dL   Alkaline Phosphatase 217 (H) 44 - 121 IU/L   AST 44 (H) 0 - 40 IU/L   ALT 22 0 - 32 IU/L  Drug Screen 13 with reflex Confirmation (AMP,BAR,BZO,COC,PCP,THC,OPI,OXY,MD,FEN,MEP,PPX,TRAM),  Serum     Status: None   Collection Time: 12/04/23  4:05 PM  Result Value Ref Range   Amphetamines, IA Negative Cutoff:50 ng/mL   Barbiturates, IA Negative Cutoff:0.1 ug/mL   Benzodiazepines, IA Negative Cutoff:20 ng/mL   Cocaine & Metabolite, IA Negative Cutoff:25 ng/mL   Phencyclidine, IA Negative Cutoff:8 ng/mL   THC(Marijuana) Metabolite, IA Negative Cutoff:5 ng/mL   Opiates, IA CANCELED ng/mL    Comment: Test not performed Insufficient sample volume to complete testing.  Result canceled by the ancillary.    Oxycodones, IA CANCELED ng/mL    Comment: Test not performed Insufficient sample volume to complete testing.  Result canceled by the ancillary.    Methadone, IA Negative Cutoff:25 ng/mL   FENTANYL, IA Negative Cutoff:1.0 ng/mL   Propoxyphene, IA Negative Cutoff:50 ng/mL   MEPERIDINE, IA Negative Cutoff:100 ng/mL   TRAMADOL, IA Negative Cutoff:50 ng/mL  Lipid panel     Status: Abnormal   Collection Time: 12/04/23  4:05 PM  Result Value Ref Range   Cholesterol, Total 162 100 - 199 mg/dL   Triglycerides 784 (H) 0 - 149 mg/dL   HDL 35 (L) >69 mg/dL   VLDL Cholesterol Cal 33 5 - 40 mg/dL   LDL Chol Calc (NIH) 94 0 - 99 mg/dL   Chol/HDL Ratio 4.6 (H) 0.0 - 4.4 ratio  Comment:                                   T. Chol/HDL Ratio                                             Men  Women                               1/2 Avg.Risk  3.4    3.3                                   Avg.Risk  5.0    4.4                                2X Avg.Risk  9.6    7.1                                3X Avg.Risk 23.4   11.0   Opiates,MS,WB/Sp Rfx     Status: None   Collection Time: 12/04/23  4:05 PM  Result Value Ref Range   Opiate Confirmation CANCELED     Comment: Test not performed Insufficient sample volume to complete testing.  Result canceled by the ancillary.    Codeine CANCELED ng/mL    Comment: Test not performed Insufficient sample volume to complete testing.  Result canceled  by the ancillary.    Morphine CANCELED ng/mL    Comment: Test not performed Insufficient sample volume to complete testing.  Result canceled by the ancillary.    6-Acetylmorphine CANCELED     Comment: Test not performed Insufficient sample volume to complete testing.  Result canceled by the ancillary.    Hydrocodone CANCELED ng/mL    Comment: Test not performed Insufficient sample volume to complete testing.  Result canceled by the ancillary.    Hydromorphone CANCELED ng/mL    Comment: Test not performed Insufficient sample volume to complete testing.  Result canceled by the ancillary.    Dihydrocodeine CANCELED ng/mL    Comment: Test not performed Insufficient sample volume to complete testing.  Result canceled by the ancillary.   Oxycodones,MS,WB/Sp Rfx     Status: None   Collection Time: 12/04/23  4:05 PM  Result Value Ref Range   Oxycodones Confirmation CANCELED     Comment: Test not performed Insufficient sample volume to complete testing.  Result canceled by the ancillary.    Oxycocone CANCELED ng/mL    Comment: Test not performed Insufficient sample volume to complete testing.  Result canceled by the ancillary.    Oxymorphone CANCELED ng/mL    Comment: Test not performed Insufficient sample volume to complete testing.  Result canceled by the ancillary.   POCT CBG (Fasting - Glucose)     Status: Abnormal   Collection Time: 01/31/24 10:00 AM  Result Value Ref Range   Glucose Fasting, POC 113 (A) 70 - 99 mg/dL  POCT Urinalysis Dipstick (40981)     Status: Abnormal   Collection Time: 02/13/24  3:17 PM  Result Value Ref Range   Color,  UA     Clarity, UA     Glucose, UA Positive (A) Negative   Bilirubin, UA Negative    Ketones, UA Negative    Spec Grav, UA >=1.030 (A) 1.010 - 1.025   Blood, UA Negative    pH, UA 5.5 5.0 - 8.0   Protein, UA Positive (A) Negative   Urobilinogen, UA 0.2 0.2 or 1.0 E.U./dL   Nitrite, UA Negative    Leukocytes, UA  Negative Negative   Appearance     Odor        Assessment & Plan:  Referral sent to endocrinology. UA today, will send for culture.  Schedule appointment with cardiology.  Problem List Items Addressed This Visit       Endocrine   Diabetes mellitus without complication (HCC) - Primary   Relevant Orders   Ambulatory referral to Endocrinology   Other Visit Diagnoses       Abnormal urine       Relevant Orders   POCT Urinalysis Dipstick (13086) (Completed)       Return if symptoms worsen or fail to improve, for as scheduled with TJ.   Total time spent: 25 minutes  [employer], NP  02/13/2024   This document may have been prepared by Dragon Voice Recognition software and as such may include unintentional dictation errors.

## 2024-02-14 MED ORDER — NITROFURANTOIN MONOHYD MACRO 100 MG PO CAPS: 100.0000 mg | ORAL_CAPSULE | Freq: Two times a day (BID) | ORAL | 0 refills | Status: DC

## 2024-02-15 LAB — URINE CULTURE

## 2024-02-20 ENCOUNTER — Encounter: Payer: Self-pay | Admitting: Cardiovascular Disease

## 2024-02-20 ENCOUNTER — Ambulatory Visit: Admitting: Cardiovascular Disease

## 2024-02-20 VITALS — BP 133/74 | HR 88 | Ht 68.0 in | Wt 273.0 lb

## 2024-02-20 DIAGNOSIS — I10 Essential (primary) hypertension: Secondary | ICD-10-CM | POA: Diagnosis not present

## 2024-02-20 DIAGNOSIS — R0602 Shortness of breath: Secondary | ICD-10-CM

## 2024-02-20 DIAGNOSIS — I259 Chronic ischemic heart disease, unspecified: Secondary | ICD-10-CM

## 2024-02-20 DIAGNOSIS — Z8679 Personal history of other diseases of the circulatory system: Secondary | ICD-10-CM

## 2024-02-20 DIAGNOSIS — E782 Mixed hyperlipidemia: Secondary | ICD-10-CM

## 2024-02-20 NOTE — Progress Notes (Signed)
 Cardiology Office Note   Date:  02/20/2024   ID:  Yolanda Harris, DOB 05-27-1956, MRN [id number]  PCP:  Yolanda Monday, MD  Cardiologist:  Yolanda Blackwater, MD      History of Present Illness: Yolanda Harris is a 68 y.o. female who presents for  Chief Complaint  Patient presents with   Follow-up    Follow up    No chest pain or SOB      Past Medical History:  Diagnosis Date   Diabetes mellitus without complication (HCC)    Hypertension    Sciatic leg pain      Past Surgical History:  Procedure Laterality Date   ABDOMINAL HYSTERECTOMY     HTN       Current Outpatient Medications  Medication Sig Dispense Refill   Alcohol Swabs (B-D SINGLE USE SWABS REGULAR) PADS Use to clean finger before sticking to check sugar 300 each 1   amiodarone (PACERONE) 200 MG tablet Take 2 tablets by mouth 2 (two) times daily.     amitriptyline (ELAVIL) 25 MG tablet TAKE 1 TABLET EVERY MORNING 90 tablet 3   amLODipine-benazepril (LOTREL) 5-20 MG capsule TAKE 1 CAPSULE EVERY DAY 90 capsule 3   atorvastatin (LIPITOR) 10 MG tablet TAKE 1 TABLET EVERY EVENING 90 tablet 1   Blood Glucose Monitoring Suppl (TRUE METRIX AIR GLUCOSE METER) DEVI UUD (Patient not taking: Reported on 01/31/2024) 1 each 0   celecoxib (CELEBREX) 400 MG capsule Take 1 capsule (400 mg total) by mouth daily after breakfast. 90 capsule 1   cyclobenzaprine (FLEXERIL) 10 MG tablet Take 1 tablet (10 mg total) by mouth 3 (three) times daily. 270 tablet 0   furosemide (LASIX) 40 MG tablet Take 1 tablet (40 mg total) by mouth daily. 30 tablet 0   glucose blood (RELION TRUE METRIX TEST STRIPS) test strip Use with device to check sugars  up to three times daily 300 each 2   insulin degludec (TRESIBA FLEXTOUCH) 200 UNIT/ML FlexTouch Pen Inject 110 Units into the skin daily.     meloxicam (MOBIC) 15 MG tablet Take 1 tablet (15 mg total) by mouth daily. 90 tablet 0   metoprolol tartrate (LOPRESSOR) 25 MG tablet Take 1 tablet  (25 mg total) by mouth 2 (two) times daily. 180 tablet 1   morphine (MS CONTIN) 15 MG 12 hr tablet Take 1 tablet (15 mg total) by mouth 2 (two) times daily. 60 tablet 0   [START ON 03/02/2024] morphine (MS CONTIN) 15 MG 12 hr tablet Take 1 tablet (15 mg total) by mouth 2 (two) times daily. 60 tablet 0   nitrofurantoin, macrocrystal-monohydrate, (MACROBID) 100 MG capsule Take 1 capsule (100 mg total) by mouth 2 (two) times daily. 14 capsule 0   pioglitazone (ACTOS) 45 MG tablet Take 1 tablet (45 mg total) by mouth every morning. 90 tablet 1   pregabalin (LYRICA) 300 MG capsule Take 1 capsule (300 mg total) by mouth 2 (two) times daily. 60 capsule 3   Semaglutide, 2 MG/DOSE, (OZEMPIC, 2 MG/DOSE,) 8 MG/3ML SOPN INJECT 2 MG UNDER THE SKIN ONCE WEEKLY     TRUEplus Lancets 33G MISC Use with device to check sugars up to 3 times daily 300 each 1   Zinc Oxide 15 % CREA Apply to left leg wound bid 99 g 0   No current facility-administered medications for this visit.    Allergies:   Amiodarone and Sulfa antibiotics    Social History:   reports that  she has been smoking cigarettes. She has never used smokeless tobacco. She reports that she does not currently use alcohol. She reports that she does not use drugs.   Family History:  family history is not on file.    ROS:     Review of Systems  Constitutional: Negative.   HENT: Negative.    Eyes: Negative.   Respiratory: Negative.    Gastrointestinal: Negative.   Genitourinary: Negative.   Musculoskeletal: Negative.   Skin: Negative.   Neurological: Negative.   Endo/Heme/Allergies: Negative.   Psychiatric/Behavioral: Negative.    All other systems reviewed and are negative.     All other systems are reviewed and negative.    PHYSICAL EXAM: VS:  BP 133/74   Pulse 88   Ht 5\' 8"  (1.727 m)   Wt 273 lb (123.8 kg)   SpO2 95%   BMI 41.51 kg/m  , BMI Body mass index is 41.51 kg/m. Last weight:  Wt Readings from Last 3 Encounters:  02/20/24  273 lb (123.8 kg)  02/13/24 270 lb (122.5 kg)  01/31/24 272 lb (123.4 kg)     Physical Exam Constitutional:      Appearance: Normal appearance.  Cardiovascular:     Rate and Rhythm: Normal rate and regular rhythm.     Heart sounds: Normal heart sounds.  Pulmonary:     Effort: Pulmonary effort is normal.     Breath sounds: Normal breath sounds.  Musculoskeletal:     Right lower leg: No edema.     Left lower leg: No edema.  Neurological:     Mental Status: She is alert.       EKG:   Recent Labs: 10/12/2023: Hemoglobin 13.1; Platelets 89 12/04/2023: ALT 22; BUN 10; Creatinine, Ser 0.65; Potassium 5.2; Sodium 138    Lipid Panel    Component Value Date/Time   CHOL 162 12/04/2023 1605   TRIG 190 (H) 12/04/2023 1605   HDL 35 (L) 12/04/2023 1605   CHOLHDL 4.6 (H) 12/04/2023 1605   LDLCALC 94 12/04/2023 1605      Other studies Reviewed: Additional studies/ records that were reviewed today include:  Review of the above records demonstrates:       No data to display            ASSESSMENT AND PLAN:    ICD-10-CM   1. Chest pain due to myocardial ischemia, unspecified ischemic chest pain type  I25.9    resolved chest pain and was schedualled for CCTA but Ct went down. Had afib probably causing symptoms of palpitation and chest pains. If CP, then cath.    2. Primary hypertension  I10     3. Mixed hyperlipidemia  E78.2     4. SOB (shortness of breath)  R06.02     5. Atrial fibrillation, currently in sinus rhythm  Z86.79        Problem List Items Addressed This Visit       Cardiovascular and Mediastinum   Primary hypertension     Other   Atrial fibrillation, currently in sinus rhythm   SOB (shortness of breath)   Mixed hyperlipidemia   Other Visit Diagnoses       Chest pain due to myocardial ischemia, unspecified ischemic chest pain type    -  Primary   resolved chest pain and was schedualled for CCTA but Ct went down. Had afib probably causing  symptoms of palpitation and chest pains. If CP, then cath.  Disposition:   Return in about 3 months (around 05/21/2024).    Total time spent: 30 minutes  Signed,  Yolanda Blackwater, MD  02/20/2024 2:30 PM    [facility]

## 2024-03-19 ENCOUNTER — Other Ambulatory Visit: Payer: Self-pay | Admitting: Internal Medicine

## 2024-03-19 DIAGNOSIS — Z1231 Encounter for screening mammogram for malignant neoplasm of breast: Secondary | ICD-10-CM

## 2024-03-23 ENCOUNTER — Other Ambulatory Visit: Payer: Self-pay | Admitting: Internal Medicine

## 2024-03-23 DIAGNOSIS — E782 Mixed hyperlipidemia: Secondary | ICD-10-CM

## 2024-03-23 DIAGNOSIS — E119 Type 2 diabetes mellitus without complications: Secondary | ICD-10-CM

## 2024-03-25 ENCOUNTER — Other Ambulatory Visit: Payer: Self-pay | Admitting: Internal Medicine

## 2024-03-28 ENCOUNTER — Encounter

## 2024-03-28 ENCOUNTER — Ambulatory Visit
Admission: RE | Admit: 2024-03-28 | Discharge: 2024-03-28 | Disposition: A | Discharge status: Home / Self Care | Source: Ambulatory Visit | Attending: Internal Medicine | Admitting: Internal Medicine

## 2024-03-28 DIAGNOSIS — Z1231 Encounter for screening mammogram for malignant neoplasm of breast: Secondary | ICD-10-CM | POA: Diagnosis not present

## 2024-04-01 ENCOUNTER — Ambulatory Visit (INDEPENDENT_AMBULATORY_CARE_PROVIDER_SITE_OTHER): Admitting: Internal Medicine

## 2024-04-01 DIAGNOSIS — Z0130 Encounter for examination of blood pressure without abnormal findings: Secondary | ICD-10-CM

## 2024-04-01 DIAGNOSIS — E119 Type 2 diabetes mellitus without complications: Secondary | ICD-10-CM

## 2024-04-01 DIAGNOSIS — G894 Chronic pain syndrome: Secondary | ICD-10-CM | POA: Diagnosis not present

## 2024-04-01 MED ORDER — CYCLOBENZAPRINE HCL 10 MG PO TABS: 10.0000 mg | ORAL_TABLET | Freq: Three times a day (TID) | ORAL | 0 refills | Status: DC

## 2024-04-01 MED ORDER — PREGABALIN 300 MG PO CAPS: 300.0000 mg | ORAL_CAPSULE | Freq: Two times a day (BID) | ORAL | 1 refills | Status: DC

## 2024-04-01 MED ORDER — MORPHINE SULFATE ER 15 MG PO TBCR: 15.0000 mg | EXTENDED_RELEASE_TABLET | Freq: Two times a day (BID) | ORAL | 0 refills | Status: DC

## 2024-04-01 NOTE — Progress Notes (Signed)
 Established Patient Office Visit  Subjective:  Patient ID: Yolanda Harris, female    DOB: January 20, 1956  Age: 68 y.o. MRN: [id number]  Chief Complaint  Patient presents with   Pain Management    PM    Here for pain management follow up. Chronic pain well controlled on current analgesia. Last drug screen satisfactory and pill counts have also been satisfactory.     No other concerns at this time.   Past Medical History:  Diagnosis Date   Diabetes mellitus without complication (HCC)    Hypertension    Sciatic leg pain     Past Surgical History:  Procedure Laterality Date   ABDOMINAL HYSTERECTOMY     HTN      Social History   Socioeconomic History   Marital status: Married    Spouse name: Not on file   Number of children: Not on file   Years of education: Not on file   Highest education level: Not on file  Occupational History   Not on file  Tobacco Use   Smoking status: Every Day    Current packs/day: 0.50    Types: Cigarettes   Smokeless tobacco: Never  Vaping Use   Vaping status: Never Used  Substance and Sexual Activity   Alcohol use: Not Currently   Drug use: Never   Sexual activity: Not on file  Other Topics Concern   Not on file  Social History Narrative   Not on file   Social Drivers of Health   Financial Resource Strain: Not on file  Food Insecurity: Not on file  Transportation Needs: Not on file  Physical Activity: Not on file  Stress: Not on file  Social Connections: Not on file  Intimate Partner Violence: Not on file    Family History  Problem Relation Age of Onset   Breast cancer Neg Hx     Allergies  Allergen Reactions   Amiodarone  Shortness Of Breath   Sulfa Antibiotics Rash    Outpatient Medications Prior to Visit  Medication Sig   Alcohol Swabs (B-D SINGLE USE SWABS REGULAR) PADS Use to clean finger before sticking to check sugar   amiodarone  (PACERONE ) 200 MG tablet Take 2 tablets by mouth 2 (two) times daily.    amitriptyline (ELAVIL) 25 MG tablet TAKE 1 TABLET EVERY MORNING   amLODipine-benazepril (LOTREL) 5-20 MG capsule Take 1 capsule by mouth once daily   atorvastatin  (LIPITOR) 10 MG tablet TAKE 1 TABLET EVERY EVENING   celecoxib  (CELEBREX ) 400 MG capsule Take 1 capsule (400 mg total) by mouth daily after breakfast.   furosemide  (LASIX ) 40 MG tablet Take 1 tablet (40 mg total) by mouth daily.   glucose blood (RELION TRUE METRIX TEST STRIPS) test strip Use with device to check sugars  up to three times daily   insulin degludec  (TRESIBA  FLEXTOUCH) 200 UNIT/ML FlexTouch Pen Inject 110 Units into the skin daily.   meloxicam  (MOBIC ) 15 MG tablet TAKE 1 TABLET EVERY DAY   metoprolol  tartrate (LOPRESSOR ) 25 MG tablet Take 1 tablet (25 mg total) by mouth 2 (two) times daily.   pioglitazone  (ACTOS ) 45 MG tablet Take 1 tablet (45 mg total) by mouth every morning.   Semaglutide , 2 MG/DOSE, (OZEMPIC , 2 MG/DOSE,) 8 MG/3ML SOPN INJECT 2 MG UNDER THE SKIN ONCE WEEKLY   TRUEplus Lancets 33G MISC Use with device to check sugars up to 3 times daily   Zinc  Oxide 15 % CREA Apply to left leg wound bid   [DISCONTINUED] cyclobenzaprine  (  FLEXERIL ) 10 MG tablet Take 1 tablet (10 mg total) by mouth 3 (three) times daily.   [DISCONTINUED] morphine  (MS CONTIN ) 15 MG 12 hr tablet Take 1 tablet (15 mg total) by mouth 2 (two) times daily.   [DISCONTINUED] pregabalin  (LYRICA ) 300 MG capsule Take 1 capsule (300 mg total) by mouth 2 (two) times daily.   [DISCONTINUED] Blood Glucose Monitoring Suppl (TRUE METRIX AIR GLUCOSE METER) DEVI UUD (Patient not taking: Reported on 01/31/2024)   [DISCONTINUED] morphine  (MS CONTIN ) 15 MG 12 hr tablet Take 1 tablet (15 mg total) by mouth 2 (two) times daily. (Patient not taking: Reported on 04/01/2024)   [DISCONTINUED] nitrofurantoin , macrocrystal-monohydrate, (MACROBID ) 100 MG capsule Take 1 capsule (100 mg total) by mouth 2 (two) times daily. (Patient not taking: Reported on 04/01/2024)   No  facility-administered medications prior to visit.    Review of Systems  Constitutional: Negative.   HENT: Negative.    Eyes: Negative.   Respiratory: Negative.    Cardiovascular: Negative.   Gastrointestinal: Negative.   Genitourinary: Negative.   Musculoskeletal:  Positive for joint pain (bilat knee pain worse on the right).  Skin: Negative.   Neurological: Negative.        Poor balance  Endo/Heme/Allergies: Negative.   Psychiatric/Behavioral: Negative.    All other systems reviewed and are negative.      Objective:   BP 118/68   Pulse 92   Temp (!) 96.9 F (36.1 C)   Ht 5\' 8"  (1.727 m)   Wt 274 lb 9.6 oz (124.6 kg)   SpO2 94%   BMI 41.75 kg/m   Vitals:   04/01/24 1206  BP: 118/68  Pulse: 92  Temp: (!) 96.9 F (36.1 C)  Height: 5\' 8"  (1.727 m)  Weight: 274 lb 9.6 oz (124.6 kg)  SpO2: 94%  BMI (Calculated): 41.76    Physical Exam Vitals reviewed.  Constitutional:      General: She is not in acute distress.    Appearance: She is obese.     Comments: Contusion on rear scalp still visible  HENT:     Head: Normocephalic and atraumatic.     Nose: Nose normal.     Mouth/Throat:     Mouth: Mucous membranes are moist.  Eyes:     Extraocular Movements: Extraocular movements intact.     Pupils: Pupils are equal, round, and reactive to light.  Cardiovascular:     Rate and Rhythm: Normal rate and regular rhythm.     Heart sounds: No murmur heard. Pulmonary:     Effort: Pulmonary effort is normal.     Breath sounds: No rhonchi or rales.  Abdominal:     General: Abdomen is flat.     Palpations: There is no hepatomegaly, splenomegaly or mass.  Musculoskeletal:        General: No swelling. Normal range of motion.     Cervical back: Normal range of motion. No tenderness.     Right lower leg: Edema present.     Left lower leg: Edema present.     Left foot: Normal.     Comments: Left soft shoe  Skin:    General: Skin is warm and dry.     Findings: Wound  present.     Comments: LLE, superficial clean base with granulating tissue  Neurological:     General: No focal deficit present.     Mental Status: She is alert and oriented to person, place, and time.     Cranial Nerves: No  cranial nerve deficit.     Motor: No weakness.  Psychiatric:        Mood and Affect: Mood normal.        Behavior: Behavior normal.      No results found for any visits on 04/01/24.  Recent Results (from the past 2160 hours)  POCT CBG (Fasting - Glucose)     Status: Abnormal   Collection Time: 01/31/24 10:00 AM  Result Value Ref Range   Glucose Fasting, POC 113 (A) 70 - 99 mg/dL  POCT Urinalysis Dipstick (16109)     Status: Abnormal   Collection Time: 02/13/24  3:17 PM  Result Value Ref Range   Color, UA     Clarity, UA     Glucose, UA Positive (A) Negative   Bilirubin, UA Negative    Ketones, UA Negative    Spec Grav, UA >=1.030 (A) 1.010 - 1.025   Blood, UA Negative    pH, UA 5.5 5.0 - 8.0   Protein, UA Positive (A) Negative   Urobilinogen, UA 0.2 0.2 or 1.0 E.U./dL   Nitrite, UA Negative    Leukocytes, UA Negative Negative   Appearance     Odor    Urine Culture     Status: None   Collection Time: 02/13/24  4:03 PM   Specimen: Urine   UR  Result Value Ref Range   Urine Culture, Routine CANCELED     Comment: Test not performed. Specimen not received at refrigerated temperature.  Result canceled by the ancillary.       Assessment & Plan:  As per problem list  Problem List Items Addressed This Visit       Endocrine   Diabetes mellitus without complication (HCC)     Other   Chronic pain syndrome   Relevant Medications   morphine  (MS CONTIN ) 15 MG 12 hr tablet   pregabalin  (LYRICA ) 300 MG capsule   cyclobenzaprine  (FLEXERIL ) 10 MG tablet    Return in about 2 weeks (around 04/15/2024) for fu with labs prior.   Total time spent: 20 minutes  Arzella Bitters, MD  04/01/2024   This document may have been prepared by [facility]  Voice Recognition software and as such may include unintentional dictation errors.

## 2024-04-09 ENCOUNTER — Telehealth: Payer: Self-pay

## 2024-04-09 NOTE — Telephone Encounter (Signed)
 Patient Yolanda Harris stating that the [facility] told her that the  manufacturers are no longer distributing Morphine  any longer? They only gave her 40 tablets, do you know anything about this? What should the patient do?

## 2024-04-12 ENCOUNTER — Other Ambulatory Visit: Payer: Self-pay | Admitting: Internal Medicine

## 2024-04-12 MED ORDER — ONDANSETRON 4 MG PO TBDP: 4.0000 mg | ORAL_TABLET | Freq: Three times a day (TID) | ORAL | 0 refills | Status: DC | PRN

## 2024-04-18 ENCOUNTER — Other Ambulatory Visit: Payer: Self-pay | Admitting: Internal Medicine

## 2024-04-18 ENCOUNTER — Other Ambulatory Visit

## 2024-04-18 DIAGNOSIS — G894 Chronic pain syndrome: Secondary | ICD-10-CM | POA: Diagnosis not present

## 2024-04-18 DIAGNOSIS — E119 Type 2 diabetes mellitus without complications: Secondary | ICD-10-CM | POA: Diagnosis not present

## 2024-04-19 ENCOUNTER — Ambulatory Visit: Admitting: Internal Medicine

## 2024-04-23 ENCOUNTER — Other Ambulatory Visit: Payer: Self-pay | Admitting: Internal Medicine

## 2024-04-23 ENCOUNTER — Telehealth: Payer: Self-pay

## 2024-04-23 DIAGNOSIS — G894 Chronic pain syndrome: Secondary | ICD-10-CM

## 2024-04-23 NOTE — Telephone Encounter (Signed)
 Patient is still having trouble getting MS Contin  its on a nationwide back order, she states she was previously on Oxycontin but doesn't remember the dose she said that is the closest thing to relief   [facility] [address]

## 2024-04-23 NOTE — Progress Notes (Signed)
   04/23/2024  Patient ID: Yolanda Harris, female   DOB: 09/06/1956, 67 y.o.   MRN: [id number]  Pharmacy Quality Measure Review  This patient is appearing on a report for being at risk of failing the Glycemic Status Assessment in Diabetes measure this calendar year.   Last documented A1c or GMI 8.4 on 10/12/23  Attempted to contact patient, left voicemail to return call.  Carnell Christian, PharmD Clinical Pharmacist [phone]

## 2024-04-25 ENCOUNTER — Other Ambulatory Visit: Payer: Self-pay | Admitting: Internal Medicine

## 2024-04-25 DIAGNOSIS — G894 Chronic pain syndrome: Secondary | ICD-10-CM

## 2024-04-25 MED ORDER — OXYCODONE HCL ER 10 MG PO T12A: 10.0000 mg | EXTENDED_RELEASE_TABLET | Freq: Two times a day (BID) | ORAL | 0 refills | Status: DC

## 2024-04-25 MED ORDER — OXYCODONE HCL ER 10 MG PO T12A: 10.0000 mg | EXTENDED_RELEASE_TABLET | Freq: Two times a day (BID) | ORAL | 0 refills | Status: AC

## 2024-04-29 LAB — OXYCODONES,MS,WB/SP RFX
Oxycocone: NEGATIVE ng/mL
Oxycodones Confirmation: NEGATIVE
Oxymorphone: NEGATIVE ng/mL

## 2024-04-29 LAB — DRUG SCREEN 13 W/CONF , SERUM
Amphetamines, IA: NEGATIVE ng/mL
Barbiturates, IA: NEGATIVE ug/mL
Benzodiazepines, IA: NEGATIVE ng/mL
Cocaine & Metabolite, IA: NEGATIVE ng/mL
FENTANYL, IA: NEGATIVE ng/mL
MEPERIDINE, IA: NEGATIVE ng/mL
Methadone, IA: NEGATIVE ng/mL
Opiates, IA: POSITIVE ng/mL — AB
Oxycodones, IA: NEGATIVE ng/mL
Phencyclidine, IA: NEGATIVE ng/mL
Propoxyphene, IA: NEGATIVE ng/mL
THC(Marijuana) Metabolite, IA: NEGATIVE ng/mL
TRAMADOL, IA: NEGATIVE ng/mL

## 2024-04-29 LAB — LIPID PANEL
Chol/HDL Ratio: 4.1 ratio (ref 0.0–4.4)
Cholesterol, Total: 150 mg/dL (ref 100–199)
HDL: 37 mg/dL — ABNORMAL LOW (ref >39)
LDL Chol Calc (NIH): 92 mg/dL (ref 0–99)
Triglycerides: 117 mg/dL (ref 0–149)
VLDL Cholesterol Cal: 21 mg/dL (ref 5–40)

## 2024-04-29 LAB — OPIATES,MS,WB/SP RFX
6-Acetylmorphine: NEGATIVE
Codeine: NEGATIVE ng/mL
Dihydrocodeine: NEGATIVE ng/mL
Hydrocodone: NEGATIVE ng/mL
Hydromorphone: NEGATIVE ng/mL
Morphine: 19.7 ng/mL
Opiate Confirmation: POSITIVE

## 2024-04-29 LAB — HEMOGLOBIN A1C
Est. average glucose Bld gHb Est-mCnc: 177 mg/dL
Hgb A1c MFr Bld: 7.8 % — ABNORMAL HIGH (ref 4.8–5.6)

## 2024-05-02 ENCOUNTER — Ambulatory Visit: Payer: Self-pay

## 2024-05-10 ENCOUNTER — Telehealth: Payer: Self-pay

## 2024-05-10 NOTE — Telephone Encounter (Signed)
 Patient states that her pain meds that we sent in for her are costing $213 a month since having to change it from the Morphine  that's on back order to Oxycontin 

## 2024-05-13 ENCOUNTER — Other Ambulatory Visit: Payer: Self-pay | Admitting: Internal Medicine

## 2024-05-13 DIAGNOSIS — G894 Chronic pain syndrome: Secondary | ICD-10-CM

## 2024-05-13 MED ORDER — OXYCODONE HCL 5 MG PO TABS: 5.0000 mg | ORAL_TABLET | Freq: Four times a day (QID) | ORAL | 0 refills | Status: DC | PRN

## 2024-05-21 ENCOUNTER — Other Ambulatory Visit: Payer: Self-pay

## 2024-05-21 ENCOUNTER — Ambulatory Visit: Admitting: Cardiovascular Disease

## 2024-05-21 MED ORDER — PAXLOVID (300/100) 20 X 150 MG & 10 X 100MG PO TBPK: 3.0000 | ORAL_TABLET | Freq: Two times a day (BID) | ORAL | 0 refills | Status: DC

## 2024-05-21 MED ORDER — PAXLOVID (300/100) 20 X 150 MG & 10 X 100MG PO TBPK
3.0000 | ORAL_TABLET | Freq: Two times a day (BID) | ORAL | 0 refills | Status: AC
  Filled 2024-05-21 (×2): qty 30, 5d supply, fill #0

## 2024-05-31 ENCOUNTER — Ambulatory Visit (INDEPENDENT_AMBULATORY_CARE_PROVIDER_SITE_OTHER): Admitting: Internal Medicine

## 2024-05-31 ENCOUNTER — Encounter: Payer: Self-pay | Admitting: Internal Medicine

## 2024-05-31 VITALS — BP 110/82 | HR 94 | Temp 98.7°F | Ht 68.0 in | Wt 253.0 lb

## 2024-05-31 DIAGNOSIS — R0602 Shortness of breath: Secondary | ICD-10-CM | POA: Diagnosis not present

## 2024-05-31 DIAGNOSIS — I10 Essential (primary) hypertension: Secondary | ICD-10-CM | POA: Diagnosis not present

## 2024-05-31 DIAGNOSIS — G894 Chronic pain syndrome: Secondary | ICD-10-CM | POA: Diagnosis not present

## 2024-05-31 DIAGNOSIS — E119 Type 2 diabetes mellitus without complications: Secondary | ICD-10-CM | POA: Diagnosis not present

## 2024-05-31 DIAGNOSIS — B3731 Acute candidiasis of vulva and vagina: Secondary | ICD-10-CM

## 2024-05-31 MED ORDER — OXYCODONE HCL 5 MG PO TABS: 5.0000 mg | ORAL_TABLET | Freq: Four times a day (QID) | ORAL | 0 refills | Status: AC | PRN

## 2024-05-31 MED ORDER — ONDANSETRON 4 MG PO TBDP: 4.0000 mg | ORAL_TABLET | Freq: Three times a day (TID) | ORAL | 0 refills | Status: AC | PRN

## 2024-05-31 MED ORDER — FLUCONAZOLE 150 MG PO TABS: 150.0000 mg | ORAL_TABLET | Freq: Once | ORAL | 0 refills | Status: AC

## 2024-05-31 NOTE — Progress Notes (Signed)
 Established Patient Office Visit  Subjective:  Patient ID: Yolanda Harris, female    DOB: 16-May-1956  Age: 68 y.o. MRN: [id number]  Chief Complaint  Patient presents with   Pain Management    Here for pain management follow up. Chronic pain  uncontrolled on current analgesia, mprphine er no longer in stock and unable to afford Oxy ER. Last UDS satisfactory and pill counts of 34 not satisfactory as she states she didn't take her analgesia as often while suffering from COVID. Still c/o SOBOE while recovering from COVID.      No other concerns at this time.   Past Medical History:  Diagnosis Date   Diabetes mellitus without complication (HCC)    Hypertension    Sciatic leg pain     Past Surgical History:  Procedure Laterality Date   ABDOMINAL HYSTERECTOMY     HTN      Social History   Socioeconomic History   Marital status: Married    Spouse name: Not on file   Number of children: Not on file   Years of education: Not on file   Highest education level: Not on file  Occupational History   Not on file  Tobacco Use   Smoking status: Every Day    Current packs/day: 0.50    Types: Cigarettes   Smokeless tobacco: Never  Vaping Use   Vaping status: Never Used  Substance and Sexual Activity   Alcohol use: Not Currently   Drug use: Never   Sexual activity: Not on file  Other Topics Concern   Not on file  Social History Narrative   Not on file   Social Drivers of Health   Financial Resource Strain: Not on file  Food Insecurity: Not on file  Transportation Needs: Not on file  Physical Activity: Not on file  Stress: Not on file  Social Connections: Not on file  Intimate Partner Violence: Not on file    Family History  Problem Relation Age of Onset   Breast cancer Neg Hx     Allergies  Allergen Reactions   Amiodarone  Shortness Of Breath   Sulfa Antibiotics Rash    Outpatient Medications Prior to Visit  Medication Sig   Alcohol Swabs (B-D SINGLE  USE SWABS REGULAR) PADS Use to clean finger before sticking to check sugar   amitriptyline (ELAVIL) 25 MG tablet TAKE 1 TABLET EVERY MORNING   amLODipine-benazepril (LOTREL) 5-20 MG capsule Take 1 capsule by mouth once daily   atorvastatin  (LIPITOR) 10 MG tablet TAKE 1 TABLET EVERY EVENING   celecoxib  (CELEBREX ) 400 MG capsule Take 1 capsule (400 mg total) by mouth daily after breakfast.   cyclobenzaprine  (FLEXERIL ) 10 MG tablet Take 1 tablet (10 mg total) by mouth 3 (three) times daily.   furosemide  (LASIX ) 40 MG tablet Take 1 tablet (40 mg total) by mouth daily.   glucose blood (RELION TRUE METRIX TEST STRIPS) test strip Use with device to check sugars  up to three times daily   insulin degludec  (TRESIBA  FLEXTOUCH) 200 UNIT/ML FlexTouch Pen Inject 110 Units into the skin daily.   meloxicam  (MOBIC ) 15 MG tablet TAKE 1 TABLET EVERY DAY   metoprolol  tartrate (LOPRESSOR ) 25 MG tablet Take 1 tablet (25 mg total) by mouth 2 (two) times daily.   pioglitazone  (ACTOS ) 45 MG tablet Take 1 tablet (45 mg total) by mouth every morning.   pregabalin  (LYRICA ) 300 MG capsule Take 1 capsule (300 mg total) by mouth 2 (two) times daily.  Semaglutide , 2 MG/DOSE, (OZEMPIC , 2 MG/DOSE,) 8 MG/3ML SOPN INJECT 2 MG UNDER THE SKIN ONCE WEEKLY   TRUEplus Lancets 33G MISC Use with device to check sugars up to 3 times daily   Zinc  Oxide 15 % CREA Apply to left leg wound bid   [DISCONTINUED] oxyCODONE  (OXY IR/ROXICODONE ) 5 MG immediate release tablet Take 1 tablet (5 mg total) by mouth every 6 (six) hours as needed for up to 18 days for severe pain (pain score 7-10).   amiodarone  (PACERONE ) 200 MG tablet Take 2 tablets by mouth 2 (two) times daily. (Patient not taking: Reported on 05/31/2024)   [DISCONTINUED] ondansetron  (ZOFRAN -ODT) 4 MG disintegrating tablet DISSOLVE 1 TABLET IN MOUTH EVERY 8 HOURS AS NEEDED FOR NAUSEA OR VOMITING (Patient not taking: Reported on 05/31/2024)   No facility-administered medications prior to  visit.    Review of Systems  Constitutional: Negative.   HENT: Negative.    Eyes: Negative.   Respiratory:  Positive for shortness of breath. Negative for cough.   Cardiovascular: Negative.   Gastrointestinal: Negative.   Genitourinary: Negative.   Musculoskeletal:  Positive for joint pain (bilat knee pain worse on the right).  Skin: Negative.   Neurological: Negative.        Poor balance  Endo/Heme/Allergies: Negative.   Psychiatric/Behavioral: Negative.    All other systems reviewed and are negative.      Objective:   BP 110/82   Pulse 94   Temp 98.7 F (37.1 C)   Ht 5' 8 (1.727 m)   Wt 253 lb (114.8 kg)   SpO2 97%   BMI 38.47 kg/m   Vitals:   05/31/24 1358  BP: 110/82  Pulse: 94  Temp: 98.7 F (37.1 C)  Height: 5' 8 (1.727 m)  Weight: 253 lb (114.8 kg)  SpO2: 97%  BMI (Calculated): 38.48    Physical Exam Vitals reviewed.  Constitutional:      General: She is not in acute distress.    Appearance: She is obese.     Comments: Contusion on rear scalp still visible  HENT:     Head: Normocephalic and atraumatic.     Nose: Nose normal.     Mouth/Throat:     Mouth: Mucous membranes are moist.  Eyes:     Extraocular Movements: Extraocular movements intact.     Pupils: Pupils are equal, round, and reactive to light.  Cardiovascular:     Rate and Rhythm: Normal rate and regular rhythm.     Heart sounds: No murmur heard. Pulmonary:     Effort: Pulmonary effort is normal.     Breath sounds: No rhonchi or rales.  Abdominal:     General: Abdomen is flat.     Palpations: There is no hepatomegaly, splenomegaly or mass.  Musculoskeletal:        General: No swelling. Normal range of motion.     Cervical back: Normal range of motion. No tenderness.     Right lower leg: Edema present.     Left lower leg: Edema present.     Left foot: Normal.     Comments: Left soft shoe  Skin:    General: Skin is warm and dry.     Findings: Wound present.     Comments:  LLE, superficial clean base with granulating tissue  Neurological:     General: No focal deficit present.     Mental Status: She is alert and oriented to person, place, and time.     Cranial Nerves: No  cranial nerve deficit.     Motor: No weakness.  Psychiatric:        Mood and Affect: Mood normal.        Behavior: Behavior normal.      No results found for any visits on 05/31/24.  Recent Results (from the past 2160 hours)  Drug Screen 13 with reflex Confirmation (AMP,BAR,BZO,COC,PCP,THC,OPI,OXY,MD,FEN,MEP,PPX,TRAM), Serum     Status: Abnormal   Collection Time: 04/18/24  2:58 PM  Result Value Ref Range   Amphetamines, IA Negative Cutoff:50 ng/mL   Barbiturates, IA Negative Cutoff:0.1 ug/mL   Benzodiazepines, IA Negative Cutoff:20 ng/mL   Cocaine & Metabolite, IA Negative Cutoff:25 ng/mL   Phencyclidine, IA Negative Cutoff:8 ng/mL   THC(Marijuana) Metabolite, IA Negative Cutoff:5 ng/mL   Opiates, IA ++POSITIVE++ (A) Cutoff:5 ng/mL   Oxycodones, IA Negative Cutoff:5 ng/mL    Comment: Presumptive immunoassay result indicated need for further testing; definitive confirmation was negative.    Methadone, IA Negative Cutoff:25 ng/mL   FENTANYL, IA Negative Cutoff:1.0 ng/mL   Propoxyphene, IA Negative Cutoff:50 ng/mL   MEPERIDINE, IA Negative Cutoff:100 ng/mL   TRAMADOL, IA Negative Cutoff:50 ng/mL    Comment: This test was developed and its performance characteristics determined by [facility].  It has not been cleared or approved by the Food and Drug Administration.   Lipid panel     Status: Abnormal   Collection Time: 04/18/24  2:58 PM  Result Value Ref Range   Cholesterol, Total 150 100 - 199 mg/dL   Triglycerides 882 0 - 149 mg/dL   HDL 37 (L) >60 mg/dL   VLDL Cholesterol Cal 21 5 - 40 mg/dL   LDL Chol Calc (NIH) 92 0 - 99 mg/dL   Chol/HDL Ratio 4.1 0.0 - 4.4 ratio    Comment:                                   T. Chol/HDL Ratio                                              Men  Women                               1/2 Avg.Risk  3.4    3.3                                   Avg.Risk  5.0    4.4                                2X Avg.Risk  9.6    7.1                                3X Avg.Risk 23.4   11.0   Hemoglobin A1c     Status: Abnormal   Collection Time: 04/18/24  2:58 PM  Result Value Ref Range   Hgb A1c MFr Bld 7.8 (H) 4.8 - 5.6 %    Comment:          Prediabetes: 5.7 - 6.4  Diabetes: >6.4          Glycemic control for adults with diabetes: <7.0    Est. average glucose Bld gHb Est-mCnc 177 mg/dL  Opiates,MS,WB/Sp Rfx     Status: None   Collection Time: 04/18/24  2:58 PM  Result Value Ref Range   Opiate Confirmation Positive    Codeine Negative ng/mL   Morphine  19.7 ng/mL   6-Acetylmorphine Negative    Hydrocodone Negative ng/mL   Hydromorphone Negative ng/mL   Dihydrocodeine Negative ng/mL    Comment: Expected metabolism of opiate class drugs:   Parent Drug       Detected Metabolites  -----------       --------------------  Codeine:          Major:  Morphine                     Minor:  Hydrocodone, Hydromorphone,                            Dihydrocodeine  Morphine :         Minor:  Hydromorphone  Hydrocodone:      Hydromorphone, Dihydrocodeine  Hydromorphone:    None  Dihydrocodeine:   None  Heroin:           6-Acetylmorphine                    Morphine                     Codeine, in small amounts in comparison                     to morphine , is often detected when                     heroin is the source drug. Confirmation threshold: 1.0 ng/mL   Oxycodones,MS,WB/Sp Rfx     Status: None   Collection Time: 04/18/24  2:58 PM  Result Value Ref Range   Oxycodones Confirmation Negative    Oxycocone Negative ng/mL   Oxymorphone Negative ng/mL    Comment: Confirmation threshold: 1.0 ng/mL      Assessment & Plan:  As per problem list. Will refer to pain management for further management of her analgesia. Problem List Items  Addressed This Visit       Cardiovascular and Mediastinum   Primary hypertension     Endocrine   Diabetes mellitus without complication (HCC)     Other   SOB (shortness of breath)   Chronic pain syndrome - Primary   Relevant Medications   oxyCODONE  (OXY IR/ROXICODONE ) 5 MG immediate release tablet (Start on 06/08/2024)   Other Relevant Orders   Ambulatory referral to Pain Clinic   Other Visit Diagnoses       Candidiasis of female genitalia       Relevant Medications   fluconazole  (DIFLUCAN ) 150 MG tablet     Vaginal candida       Relevant Medications   fluconazole  (DIFLUCAN ) 150 MG tablet       Return in about 6 weeks (around 07/12/2024) for fu with labs prior.   Total time spent: 20 minutes  Sherrill Cinderella Perry, MD  05/31/2024   This document may have been prepared by [facility] Voice Recognition software and as such may include unintentional dictation errors.

## 2024-06-02 ENCOUNTER — Other Ambulatory Visit: Payer: Self-pay | Admitting: Internal Medicine

## 2024-06-03 ENCOUNTER — Other Ambulatory Visit: Payer: Self-pay

## 2024-06-17 ENCOUNTER — Other Ambulatory Visit: Payer: Self-pay

## 2024-06-17 ENCOUNTER — Ambulatory Visit (INDEPENDENT_AMBULATORY_CARE_PROVIDER_SITE_OTHER): Admitting: Internal Medicine

## 2024-06-17 ENCOUNTER — Ambulatory Visit: Payer: Self-pay | Admitting: Internal Medicine

## 2024-06-17 ENCOUNTER — Emergency Department

## 2024-06-17 ENCOUNTER — Emergency Department
Admission: EM | Admit: 2024-06-17 | Discharge: 2024-06-18 | Disposition: A | Discharge status: Home / Self Care | Attending: Emergency Medicine | Admitting: Emergency Medicine

## 2024-06-17 VITALS — BP 116/80 | HR 90 | Temp 98.7°F | Ht 68.0 in | Wt 259.8 lb

## 2024-06-17 DIAGNOSIS — R296 Repeated falls: Secondary | ICD-10-CM | POA: Diagnosis not present

## 2024-06-17 DIAGNOSIS — S0990XA Unspecified injury of head, initial encounter: Secondary | ICD-10-CM | POA: Diagnosis not present

## 2024-06-17 DIAGNOSIS — I10 Essential (primary) hypertension: Secondary | ICD-10-CM | POA: Diagnosis not present

## 2024-06-17 DIAGNOSIS — W01198A Fall on same level from slipping, tripping and stumbling with subsequent striking against other object, initial encounter: Secondary | ICD-10-CM | POA: Diagnosis not present

## 2024-06-17 DIAGNOSIS — M25562 Pain in left knee: Secondary | ICD-10-CM | POA: Diagnosis not present

## 2024-06-17 DIAGNOSIS — W19XXXA Unspecified fall, initial encounter: Secondary | ICD-10-CM

## 2024-06-17 DIAGNOSIS — E119 Type 2 diabetes mellitus without complications: Secondary | ICD-10-CM

## 2024-06-17 DIAGNOSIS — E139 Other specified diabetes mellitus without complications: Secondary | ICD-10-CM

## 2024-06-17 DIAGNOSIS — S0993XA Unspecified injury of face, initial encounter: Secondary | ICD-10-CM | POA: Diagnosis present

## 2024-06-17 DIAGNOSIS — M25561 Pain in right knee: Secondary | ICD-10-CM | POA: Insufficient documentation

## 2024-06-17 DIAGNOSIS — Z0130 Encounter for examination of blood pressure without abnormal findings: Secondary | ICD-10-CM

## 2024-06-17 DIAGNOSIS — S0083XA Contusion of other part of head, initial encounter: Secondary | ICD-10-CM | POA: Diagnosis not present

## 2024-06-17 LAB — POCT URINALYSIS DIPSTICK
Bilirubin, UA: NEGATIVE
Bilirubin, UA: NEGATIVE
Blood, UA: NEGATIVE
Blood, UA: NEGATIVE
Glucose, UA: NEGATIVE
Glucose, UA: NEGATIVE
Ketones, UA: NEGATIVE
Ketones, UA: NEGATIVE
Nitrite, UA: NEGATIVE
Protein, UA: POSITIVE — AB
Protein, UA: POSITIVE — AB
Spec Grav, UA: 1.025 (ref 1.010–1.025)
Spec Grav, UA: 1.025 (ref 1.010–1.025)
Urobilinogen, UA: 0.2 U/dL
Urobilinogen, UA: NEGATIVE U/dL — AB
pH, UA: 6 (ref 5.0–8.0)
pH, UA: 6 (ref 5.0–8.0)

## 2024-06-17 LAB — BASIC METABOLIC PANEL WITH GFR
Anion gap: 9 (ref 5–15)
BUN: 12 mg/dL (ref 8–23)
CO2: 24 mmol/L (ref 22–32)
Calcium: 8.5 mg/dL — ABNORMAL LOW (ref 8.9–10.3)
Chloride: 106 mmol/L (ref 98–111)
Creatinine, Ser: 0.77 mg/dL (ref 0.44–1.00)
GFR, Estimated: >60 mL/min (ref >60)
Glucose, Bld: 171 mg/dL — ABNORMAL HIGH (ref 70–99)
Potassium: 4.2 mmol/L (ref 3.5–5.1)
Sodium: 139 mmol/L (ref 135–145)

## 2024-06-17 LAB — POCT CBG (FASTING - GLUCOSE)-MANUAL ENTRY: Glucose Fasting, POC: 102 mg/dL — AB (ref 70–99)

## 2024-06-17 NOTE — ED Triage Notes (Signed)
 Pt reports she has had inc falls over the past week, pt was seen at PCP today and tested her urine for UTI which was negative. Pt reports today when she fell she hit her head and knees. Pt is not on blood thinners.

## 2024-06-17 NOTE — Progress Notes (Signed)
 Established Patient Office Visit  Subjective:  Patient ID: Yolanda Harris, female    DOB: 06-Oct-1956  Age: 68 y.o. MRN: [id number]  Chief Complaint  Patient presents with   Acute Visit    3 falls, being argumentative, some memory loss, not eating a lot,UA, dark urine, having trouble going to the restroom without using it on herself. Feels that the new pain medication is too strong needs to be decrease.     Recurrent falls, has fallen 3 times over the last 4 days. Daughter also states patient has been rather confused and combative as well.     No other concerns at this time.   Past Medical History:  Diagnosis Date   Diabetes mellitus without complication (HCC)    Hypertension    Sciatic leg pain     Past Surgical History:  Procedure Laterality Date   ABDOMINAL HYSTERECTOMY     HTN      Social History   Socioeconomic History   Marital status: Married    Spouse name: Not on file   Number of children: Not on file   Years of education: Not on file   Highest education level: Not on file  Occupational History   Not on file  Tobacco Use   Smoking status: Every Day    Current packs/day: 0.50    Types: Cigarettes   Smokeless tobacco: Never  Vaping Use   Vaping status: Never Used  Substance and Sexual Activity   Alcohol use: Not Currently   Drug use: Never   Sexual activity: Not on file  Other Topics Concern   Not on file  Social History Narrative   Not on file   Social Drivers of Health   Financial Resource Strain: Not on file  Food Insecurity: Not on file  Transportation Needs: Not on file  Physical Activity: Not on file  Stress: Not on file  Social Connections: Not on file  Intimate Partner Violence: Not on file    Family History  Problem Relation Age of Onset   Breast cancer Neg Hx     Allergies  Allergen Reactions   Amiodarone  Shortness Of Breath   Sulfa Antibiotics Rash    Outpatient Medications Prior to Visit  Medication Sig   Alcohol  Swabs (DROPSAFE ALCOHOL PREP) 70 % PADS USE TO CLEAN FINGER BEFORE STICKING TO CHECK SUGAR AS DIRECTED   amitriptyline (ELAVIL) 25 MG tablet TAKE 1 TABLET EVERY MORNING   amLODipine-benazepril (LOTREL) 5-20 MG capsule Take 1 capsule by mouth once daily   atorvastatin  (LIPITOR) 10 MG tablet TAKE 1 TABLET EVERY EVENING   celecoxib  (CELEBREX ) 400 MG capsule Take 1 capsule (400 mg total) by mouth daily after breakfast.   cyclobenzaprine  (FLEXERIL ) 10 MG tablet Take 1 tablet (10 mg total) by mouth 3 (three) times daily.   furosemide  (LASIX ) 40 MG tablet Take 1 tablet (40 mg total) by mouth daily.   glucose blood (RELION TRUE METRIX TEST STRIPS) test strip Use with device to check sugars  up to three times daily   insulin degludec  (TRESIBA  FLEXTOUCH) 200 UNIT/ML FlexTouch Pen Inject 110 Units into the skin daily.   meloxicam  (MOBIC ) 15 MG tablet TAKE 1 TABLET EVERY DAY   metoprolol  tartrate (LOPRESSOR ) 25 MG tablet TAKE 1 TABLET TWICE DAILY   ondansetron  (ZOFRAN -ODT) 4 MG disintegrating tablet Take 1 tablet (4 mg total) by mouth every 8 (eight) hours as needed for nausea or vomiting.   oxyCODONE  (OXY IR/ROXICODONE ) 5 MG immediate release tablet  Take 1 tablet (5 mg total) by mouth every 6 (six) hours as needed for up to 22 days for severe pain (pain score 7-10).   pioglitazone  (ACTOS ) 45 MG tablet Take 1 tablet (45 mg total) by mouth every morning.   pregabalin  (LYRICA ) 300 MG capsule Take 1 capsule (300 mg total) by mouth 2 (two) times daily.   Semaglutide , 2 MG/DOSE, (OZEMPIC , 2 MG/DOSE,) 8 MG/3ML SOPN INJECT 2 MG UNDER THE SKIN ONCE WEEKLY   TRUEplus Lancets 33G MISC TEST BLOOD SUGAR UP TO THREE TIMES DAILY   Zinc  Oxide 15 % CREA Apply to left leg wound bid   amiodarone  (PACERONE ) 200 MG tablet Take 2 tablets by mouth 2 (two) times daily. (Patient not taking: Reported on 06/17/2024)   No facility-administered medications prior to visit.    Review of Systems  Constitutional: Negative.   HENT:  Negative.    Eyes: Negative.   Respiratory:  Positive for shortness of breath. Negative for cough.   Cardiovascular: Negative.   Gastrointestinal: Negative.   Genitourinary: Negative.   Musculoskeletal:  Positive for joint pain (bilat knee pain worse on the right).  Skin: Negative.   Neurological: Negative.        Poor balance  Endo/Heme/Allergies: Negative.   Psychiatric/Behavioral: Negative.    All other systems reviewed and are negative.      Objective:   BP 116/80   Pulse 90   Temp 98.7 F (37.1 C)   Ht 5' 8 (1.727 m)   Wt 259 lb 12.8 oz (117.8 kg)   SpO2 97%   BMI 39.50 kg/m   Vitals:   06/17/24 1311  BP: 116/80  Pulse: 90  Temp: 98.7 F (37.1 C)  Height: 5' 8 (1.727 m)  Weight: 259 lb 12.8 oz (117.8 kg)  SpO2: 97%  BMI (Calculated): 39.51    Physical Exam Vitals reviewed.  Constitutional:      General: She is not in acute distress.    Appearance: She is obese.     Comments: Contusion on rear scalp still visible  HENT:     Head: Normocephalic and atraumatic.     Nose: Nose normal.     Mouth/Throat:     Mouth: Mucous membranes are moist.  Eyes:     Extraocular Movements: Extraocular movements intact.     Pupils: Pupils are equal, round, and reactive to light.  Cardiovascular:     Rate and Rhythm: Normal rate and regular rhythm.     Heart sounds: No murmur heard. Pulmonary:     Effort: Pulmonary effort is normal.     Breath sounds: No rhonchi or rales.  Abdominal:     General: Abdomen is flat.     Palpations: There is no hepatomegaly, splenomegaly or mass.  Musculoskeletal:        General: No swelling. Normal range of motion.     Cervical back: Normal range of motion. No tenderness.     Right lower leg: Edema present.     Left lower leg: Edema present.     Left foot: Normal.     Comments: Left soft shoe  Skin:    General: Skin is warm and dry.     Findings: Wound present.     Comments: LLE, superficial clean base with granulating tissue   Neurological:     General: No focal deficit present.     Mental Status: She is alert and oriented to person, place, and time.     Cranial Nerves: No cranial  nerve deficit.     Motor: No weakness.  Psychiatric:        Mood and Affect: Mood normal.        Behavior: Behavior normal.      Results for orders placed or performed in visit on 06/17/24  POCT CBG (Fasting - Glucose)  Result Value Ref Range   Glucose Fasting, POC 102 (A) 70 - 99 mg/dL  POCT urinalysis dipstick  Result Value Ref Range   Color, UA     Clarity, UA     Glucose, UA Negative Negative   Bilirubin, UA negative    Ketones, UA negative    Spec Grav, UA 1.025 1.010 - 1.025   Blood, UA negative    pH, UA 6.0 5.0 - 8.0   Protein, UA Positive (R) Negative   Urobilinogen, UA negative (A) 0.2 or 1.0 E.U./dL   Nitrite, UA     Leukocytes, UA Trace (A) Negative   Appearance     Odor    POCT Urinalysis Dipstick (81002)  Result Value Ref Range   Color, UA     Clarity, UA     Glucose, UA Negative Negative   Bilirubin, UA Negative    Ketones, UA Negative    Spec Grav, UA 1.025 1.010 - 1.025   Blood, UA Negative    pH, UA 6.0 5.0 - 8.0   Protein, UA Positive (A) Negative   Urobilinogen, UA 0.2 0.2 or 1.0 E.U./dL   Nitrite, UA Negative    Leukocytes, UA Trace (A) Negative   Appearance     Odor          Assessment & Plan:   Problem List Items Addressed This Visit       Endocrine   Diabetes mellitus without complication (HCC)   Relevant Orders   POCT Urinalysis Dipstick (81002) (Completed)   Other Visit Diagnoses       Diabetes 1.5, managed as type 2 (HCC)    -  Primary   Relevant Orders   POCT CBG (Fasting - Glucose) (Completed)   POCT urinalysis dipstick (Completed)     Recurrent falls       Relevant Orders   Ambulatory referral to Home Health       Return if symptoms worsen or fail to improve.   Total time spent: 30 minutes  Sherrill Cinderella Perry, MD  06/17/2024   This document  may have been prepared by [employer] Voice Recognition software and as such may include unintentional dictation errors.

## 2024-06-18 ENCOUNTER — Emergency Department

## 2024-06-18 DIAGNOSIS — S0990XA Unspecified injury of head, initial encounter: Secondary | ICD-10-CM | POA: Diagnosis not present

## 2024-06-18 LAB — CBC WITH DIFFERENTIAL/PLATELET
Abs Immature Granulocytes: 0.03 K/uL (ref 0.00–0.07)
Basophils Absolute: 0.1 K/uL (ref 0.0–0.1)
Basophils Relative: 2 %
Eosinophils Absolute: 0.5 K/uL (ref 0.0–0.5)
Eosinophils Relative: 7 %
HCT: 35.8 % — ABNORMAL LOW (ref 36.0–46.0)
Hemoglobin: 12.4 g/dL (ref 12.0–15.0)
Immature Granulocytes: 0 %
Lymphocytes Relative: 39 %
Lymphs Abs: 3 K/uL (ref 0.7–4.0)
MCH: 31.4 pg (ref 26.0–34.0)
MCHC: 34.6 g/dL (ref 30.0–36.0)
MCV: 90.6 fL (ref 80.0–100.0)
Monocytes Absolute: 0.9 K/uL (ref 0.1–1.0)
Monocytes Relative: 12 %
Neutro Abs: 2.9 K/uL (ref 1.7–7.7)
Neutrophils Relative %: 40 %
Platelets: 83 K/uL — ABNORMAL LOW (ref 150–400)
RBC: 3.95 MIL/uL (ref 3.87–5.11)
RDW: 18.2 % — ABNORMAL HIGH (ref 11.5–15.5)
WBC: 7.4 K/uL (ref 4.0–10.5)
nRBC: 0 % (ref 0.0–0.2)

## 2024-06-18 LAB — TROPONIN I (HIGH SENSITIVITY): Troponin I (High Sensitivity): 8 ng/L (ref <18)

## 2024-06-18 NOTE — Discharge Instructions (Signed)

## 2024-06-18 NOTE — ED Provider Notes (Signed)
 [facility] Provider Note    Event Date/Time   First MD Initiated Contact with Patient 06/18/24 0004     (approximate)   History   Fall   HPI Yolanda Harris is a 68 y.o. female who presents for evaluation after having several falls this week.  Most recently she fell and bumped her forehead.  She also landed on her knees and says she has pain in both of her knees.  She has a history of chronic pain syndrome which is managed by Dr. Albina.  She saw him today and had a normal urinalysis.  He thinks that she is likely oversedated with the oxycodone  she has been taking and the plan is to back off on the pain medication.  After her visit today she had another fall which prompted the visit here.  The patient states she feels okay right now.  Both of her knees are sore but she is able to move them.  She has a bump on the left side of her forehead but has no headache and no neck pain or stiffness.  She denies chest pain or shortness of breath.  She has had no nausea, vomiting, nor abdominal pain.     Physical Exam   Triage Vital Signs: ED Triage Vitals  Encounter Vitals Group     BP 06/17/24 2308 (!) 141/61     Girls Systolic BP Percentile --      Girls Diastolic BP Percentile --      Boys Systolic BP Percentile --      Boys Diastolic BP Percentile --      Pulse Rate 06/17/24 2308 93     Resp 06/17/24 2308 18     Temp 06/17/24 2308 98.1 F (36.7 C)     Temp src --      SpO2 06/17/24 2308 97 %     Weight 06/17/24 2307 117.9 kg (260 lb)     Height 06/17/24 2307 1.753 m (5' 9)     Head Circumference --      Peak Flow --      Pain Score 06/17/24 2307 8     Pain Loc --      Pain Education --      Exclude from Growth Chart --     Most recent vital signs: Vitals:   06/17/24 2308  BP: (!) 141/61  Pulse: 93  Resp: 18  Temp: 98.1 F (36.7 C)  SpO2: 97%    General: Awake, no distress.  Conversant and in good spirits despite the  situation. CV:  Good peripheral perfusion.  Regular rate and rhythm.  Normal heart sounds. Resp:  Normal effort. Speaking easily and comfortably, no accessory muscle usage nor intercostal retractions.  Lungs are clear to auscultation bilaterally. Abd:  No distention.  Obese.  No tenderness to palpation. Other:  Patient has a contusion and small hematoma on the left part of her forehead.  Otherwise no evidence of trauma.  No tenderness to palpation of the cervical spine with no pain or tenderness with flexion, extension, or rotation of her head and neck from side-to-side.   ED Results / Procedures / Treatments   Labs (all labs ordered are listed, but only abnormal results are displayed) Labs Reviewed  CBC WITH DIFFERENTIAL/PLATELET - Abnormal; Notable for the following components:      Result Value   HCT 35.8 (*)    RDW 18.2 (*)    Platelets 83 (*)    All  other components within normal limits  BASIC METABOLIC PANEL WITH GFR - Abnormal; Notable for the following components:   Glucose, Bld 171 (*)    Calcium  8.5 (*)    All other components within normal limits  TROPONIN I (HIGH SENSITIVITY)     EKG ED ECG REPORT I, Darleene Dome, the attending physician, personally viewed and interpreted this ECG.  Date: 06/17/2024 EKG Time: 23: 11 Rate: 98 Rhythm: normal sinus rhythm QRS Axis: Left axis deviation Intervals: normal ST/T Wave abnormalities: normal Narrative Interpretation: no evidence of acute ischemia     RADIOLOGY I independently viewed and interpreted the patient's head CT and I see no evidence of acute intracranial bleed or of skull fracture.  I also read the radiologist's report, which confirmed no acute findings.  I also reviewed and interpreted her right and left knee x-rays.  No evidence of fracture or dislocation.  Chronic changes, likely related to age and weightbearing.  I also read the radiologist's report, which confirmed no acute  findings.   PROCEDURES:  Critical Care performed: No  Procedures    IMPRESSION / MDM / ASSESSMENT AND PLAN / ED COURSE  I reviewed the triage vital signs and the nursing notes.                              Differential diagnosis includes, but is not limited to, medication/drug side effect, electrolyte or metabolic abnormality, acute intracranial neoplasm, acute intracranial bleed, musculoskeletal injury.  Patient's presentation is most consistent with acute presentation with potential threat to life or bodily function.  Labs/studies ordered: EKG, knee x-rays bilateral, CT head, high-sensitivity troponin, CBC with differential, basic metabolic panel  Interventions/Medications given:  Medications - No data to display  (Note:  hospital course my include additional interventions and/or labs/studies not listed above.)   Evaluation has been reassuring with no evidence of an acute injury on the imaging including her head CT which did not show evidence suggestive of idiopathic intracranial hypertension or acute intracranial bleed or neoplasm.  Labs are all essentially normal.  Family is at bedside and all of us  agreed that the patient has no sign of acute injury and that likely she is suffering from oversedation as suggested by her PCP.  They are comfortable with the plan for discharge and outpatient follow-up to work on a new outpatient pain regimen.  I gave my usual and customary follow-up recommendations and return precautions       FINAL CLINICAL IMPRESSION(S) / ED DIAGNOSES   Final diagnoses:  Fall, initial encounter  Acute pain of both knees     Rx / DC Orders   ED Discharge Orders     None        Note:  This document was prepared using Dragon voice recognition software and may include unintentional dictation errors.   Dome Darleene, MD 06/18/24 [phone]

## 2024-06-19 DIAGNOSIS — M5430 Sciatica, unspecified side: Secondary | ICD-10-CM | POA: Diagnosis not present

## 2024-06-19 DIAGNOSIS — I10 Essential (primary) hypertension: Secondary | ICD-10-CM | POA: Diagnosis not present

## 2024-06-19 DIAGNOSIS — G894 Chronic pain syndrome: Secondary | ICD-10-CM | POA: Diagnosis not present

## 2024-06-19 DIAGNOSIS — Z794 Long term (current) use of insulin: Secondary | ICD-10-CM | POA: Diagnosis not present

## 2024-06-19 DIAGNOSIS — F17210 Nicotine dependence, cigarettes, uncomplicated: Secondary | ICD-10-CM | POA: Diagnosis not present

## 2024-06-19 DIAGNOSIS — S8002XD Contusion of left knee, subsequent encounter: Secondary | ICD-10-CM | POA: Diagnosis not present

## 2024-06-19 DIAGNOSIS — M25561 Pain in right knee: Secondary | ICD-10-CM | POA: Diagnosis not present

## 2024-06-19 DIAGNOSIS — G8911 Acute pain due to trauma: Secondary | ICD-10-CM | POA: Diagnosis not present

## 2024-06-19 DIAGNOSIS — E1140 Type 2 diabetes mellitus with diabetic neuropathy, unspecified: Secondary | ICD-10-CM | POA: Diagnosis not present

## 2024-06-20 ENCOUNTER — Telehealth: Payer: Self-pay

## 2024-06-20 NOTE — Telephone Encounter (Signed)
 Kelly called and left vm requesting a call back regarding verbals for PT for patient. 1w9 for gait balance and fall prevention

## 2024-06-21 ENCOUNTER — Telehealth: Payer: Self-pay

## 2024-06-21 NOTE — Progress Notes (Signed)
   06/21/2024  Patient ID: Yolanda Harris, female   DOB: 1956-07-03, 68 y.o.   MRN: [id number]  Pharmacy Quality Measure Review  This patient is appearing on a report for being at risk of failing the Glycemic Status Assessment in Diabetes measure this calendar year.   Last documented A1c or GMI 7.8 on 04/18/24  Patient agrees to appt via phone on 06/25/24 at 1pm.  Jon VEAR Lindau, PharmD Clinical Pharmacist [phone]

## 2024-06-24 ENCOUNTER — Ambulatory Visit: Admitting: Internal Medicine

## 2024-06-25 ENCOUNTER — Other Ambulatory Visit: Payer: Self-pay

## 2024-06-25 ENCOUNTER — Telehealth: Payer: Self-pay

## 2024-06-25 DIAGNOSIS — Z6838 Body mass index (BMI) 38.0-38.9, adult: Secondary | ICD-10-CM | POA: Diagnosis not present

## 2024-06-25 DIAGNOSIS — E119 Type 2 diabetes mellitus without complications: Secondary | ICD-10-CM | POA: Diagnosis not present

## 2024-06-25 DIAGNOSIS — Z794 Long term (current) use of insulin: Secondary | ICD-10-CM | POA: Diagnosis not present

## 2024-06-25 DIAGNOSIS — M5430 Sciatica, unspecified side: Secondary | ICD-10-CM | POA: Diagnosis not present

## 2024-06-25 DIAGNOSIS — Z90710 Acquired absence of both cervix and uterus: Secondary | ICD-10-CM | POA: Diagnosis not present

## 2024-06-25 DIAGNOSIS — E669 Obesity, unspecified: Secondary | ICD-10-CM | POA: Diagnosis not present

## 2024-06-25 DIAGNOSIS — G894 Chronic pain syndrome: Secondary | ICD-10-CM | POA: Diagnosis not present

## 2024-06-25 DIAGNOSIS — I10 Essential (primary) hypertension: Secondary | ICD-10-CM | POA: Diagnosis not present

## 2024-06-25 DIAGNOSIS — E782 Mixed hyperlipidemia: Secondary | ICD-10-CM | POA: Diagnosis not present

## 2024-06-25 DIAGNOSIS — F17210 Nicotine dependence, cigarettes, uncomplicated: Secondary | ICD-10-CM | POA: Diagnosis not present

## 2024-06-25 NOTE — Progress Notes (Signed)
   06/25/2024  Patient ID: Connell FORBES Brighter, female   DOB: 1955-12-09, 68 y.o.   MRN: [id number]  Attempted to contact patient for scheduled appointment for medication management. Left HIPAA compliant message for patient to return my call at their convenience.    Jon VEAR Lindau, PharmD Clinical Pharmacist [phone]

## 2024-06-26 ENCOUNTER — Telehealth: Payer: Self-pay

## 2024-06-26 NOTE — Telephone Encounter (Signed)
 Chris PT with [facility] called and left vm requesting a call back for verbals for PT 2w8 & OT 1w1 for eval. Please advise

## 2024-06-26 NOTE — Progress Notes (Signed)
   06/26/2024  Patient ID: Connell FORBES Brighter, female   DOB: 02/16/1956, 68 y.o.   MRN: [id number]  Attempted to return missed phone call from patient and to reschedule missed appointment from 06/25/24. Left HIPAA compliant message for patient to return my call at their convenience.   Jon VEAR Lindau, PharmD Clinical Pharmacist [phone]

## 2024-07-01 ENCOUNTER — Other Ambulatory Visit: Payer: Self-pay

## 2024-07-01 DIAGNOSIS — G894 Chronic pain syndrome: Secondary | ICD-10-CM

## 2024-07-04 ENCOUNTER — Telehealth: Payer: Self-pay

## 2024-07-04 NOTE — Telephone Encounter (Signed)
 Yolanda Harris called asking for ICD 10 code for pts PT referral, states they can not use falls as a  code for the PT request, is there another code that we can use?   [phone]

## 2024-07-05 ENCOUNTER — Telehealth: Payer: Self-pay

## 2024-07-05 NOTE — Telephone Encounter (Signed)
 Rosina Informed - a0

## 2024-07-05 NOTE — Telephone Encounter (Signed)
 Spoke with Yolanda Harris and she said that can't work but I also gave her the sciatic pain and chronic pain syndrome she will try those and lmk if they work or not

## 2024-07-05 NOTE — Telephone Encounter (Signed)
 Rosina with [facility] called asking for OT verbal orders for 2w X 4w please advise [phone]

## 2024-07-08 ENCOUNTER — Ambulatory Visit (INDEPENDENT_AMBULATORY_CARE_PROVIDER_SITE_OTHER): Admitting: Internal Medicine

## 2024-07-08 ENCOUNTER — Ambulatory Visit
Admission: RE | Admit: 2024-07-08 | Discharge: 2024-07-08 | Disposition: A | Discharge status: Home / Self Care | Source: Ambulatory Visit | Attending: Internal Medicine | Admitting: Internal Medicine

## 2024-07-08 VITALS — BP 110/60 | HR 68 | Temp 97.7°F | Ht 68.0 in | Wt 261.2 lb

## 2024-07-08 DIAGNOSIS — J301 Allergic rhinitis due to pollen: Secondary | ICD-10-CM

## 2024-07-08 DIAGNOSIS — E139 Other specified diabetes mellitus without complications: Secondary | ICD-10-CM

## 2024-07-08 DIAGNOSIS — R0602 Shortness of breath: Secondary | ICD-10-CM

## 2024-07-08 DIAGNOSIS — E119 Type 2 diabetes mellitus without complications: Secondary | ICD-10-CM | POA: Diagnosis not present

## 2024-07-08 DIAGNOSIS — Z0130 Encounter for examination of blood pressure without abnormal findings: Secondary | ICD-10-CM

## 2024-07-08 LAB — POCT CBG (FASTING - GLUCOSE)-MANUAL ENTRY: Glucose Fasting, POC: 161 mg/dL — AB (ref 70–99)

## 2024-07-08 MED ORDER — FLUTICASONE PROPIONATE 50 MCG/ACT NA SUSP: 1.0000 | Freq: Every day | NASAL | 2 refills | Status: AC

## 2024-07-08 MED ORDER — CETIRIZINE HCL 10 MG PO TABS: 10.0000 mg | ORAL_TABLET | Freq: Every day | ORAL | 2 refills | Status: DC

## 2024-07-08 NOTE — Progress Notes (Signed)
 Established Patient Office Visit  Subjective:  Patient ID: Yolanda Harris, female    DOB: Oct 18, 1956  Age: 68 y.o. MRN: [id number]  Chief Complaint  Patient presents with   Acute Visit    Chest congestion     C/o productive cough x 2 wks productive of grey-yellowish sputum and also c/o increased use of mdi'Naturi Alarid. Denies fever or malaise. Also c/o nasal congestion and nasal discharge with increased eye drainage and itching.    No other concerns at this time.   Past Medical History:  Diagnosis Date   Diabetes mellitus without complication (HCC)    Hypertension    Sciatic leg pain     Past Surgical History:  Procedure Laterality Date   ABDOMINAL HYSTERECTOMY     HTN      Social History   Socioeconomic History   Marital status: Married    Spouse name: Not on file   Number of children: Not on file   Years of education: Not on file   Highest education level: Not on file  Occupational History   Not on file  Tobacco Use   Smoking status: Every Day    Current packs/day: 0.50    Types: Cigarettes   Smokeless tobacco: Never  Vaping Use   Vaping status: Never Used  Substance and Sexual Activity   Alcohol use: Not Currently   Drug use: Never   Sexual activity: Not on file  Other Topics Concern   Not on file  Social History Narrative   Not on file   Social Drivers of Health   Financial Resource Strain: Not on file  Food Insecurity: Not on file  Transportation Needs: Not on file  Physical Activity: Not on file  Stress: Not on file  Social Connections: Not on file  Intimate Partner Violence: Not on file    Family History  Problem Relation Age of Onset   Breast cancer Neg Hx     Allergies  Allergen Reactions   Amiodarone  Shortness Of Breath   Sulfa Antibiotics Rash    Outpatient Medications Prior to Visit  Medication Sig   Alcohol Swabs (DROPSAFE ALCOHOL PREP) 70 % PADS USE TO CLEAN FINGER BEFORE STICKING TO CHECK SUGAR AS DIRECTED   amitriptyline  (ELAVIL) 25 MG tablet TAKE 1 TABLET EVERY MORNING   amLODipine-benazepril (LOTREL) 5-20 MG capsule Take 1 capsule by mouth once daily   atorvastatin  (LIPITOR) 10 MG tablet TAKE 1 TABLET EVERY EVENING   celecoxib  (CELEBREX ) 400 MG capsule Take 1 capsule (400 mg total) by mouth daily after breakfast. (Patient taking differently: Take 400 mg by mouth as needed.)   cyclobenzaprine  (FLEXERIL ) 10 MG tablet Take 1 tablet (10 mg total) by mouth 3 (three) times daily.   furosemide  (LASIX ) 40 MG tablet Take 1 tablet (40 mg total) by mouth daily.   glucose blood (RELION TRUE METRIX TEST STRIPS) test strip Use with device to check sugars  up to three times daily   insulin degludec  (TRESIBA  FLEXTOUCH) 200 UNIT/ML FlexTouch Pen Inject 110 Units into the skin daily. (Patient taking differently: Inject 110 Units into the skin as needed.)   meloxicam  (MOBIC ) 15 MG tablet TAKE 1 TABLET EVERY DAY   metoprolol  tartrate (LOPRESSOR ) 25 MG tablet TAKE 1 TABLET TWICE DAILY   pregabalin  (LYRICA ) 300 MG capsule Take 1 capsule (300 mg total) by mouth 2 (two) times daily.   TRUEplus Lancets 33G MISC TEST BLOOD SUGAR UP TO THREE TIMES DAILY   amiodarone  (PACERONE ) 200 MG tablet  Take 2 tablets by mouth 2 (two) times daily. (Patient not taking: Reported on 07/08/2024)   pioglitazone  (ACTOS ) 45 MG tablet Take 1 tablet (45 mg total) by mouth every morning. (Patient not taking: Reported on 07/08/2024)   Semaglutide , 2 MG/DOSE, (OZEMPIC , 2 MG/DOSE,) 8 MG/3ML SOPN INJECT 2 MG UNDER THE SKIN ONCE WEEKLY (Patient not taking: Reported on 07/08/2024)   Zinc  Oxide 15 % CREA Apply to left leg wound bid (Patient not taking: Reported on 07/08/2024)   No facility-administered medications prior to visit.    Review of Systems  Constitutional: Negative.   HENT: Negative.    Eyes: Negative.   Respiratory:  Positive for shortness of breath. Negative for cough.   Cardiovascular: Negative.   Gastrointestinal: Negative.   Genitourinary:  Negative.   Musculoskeletal:  Positive for joint pain (bilat knee pain worse on the right).  Skin: Negative.   Neurological: Negative.        Poor balance  Endo/Heme/Allergies: Negative.   Psychiatric/Behavioral: Negative.    All other systems reviewed and are negative.      Objective:   BP 110/60   Pulse 68   Temp 97.7 F (36.5 C)   Ht 5' 8 (1.727 m)   Wt 261 lb 3.2 oz (118.5 kg)   SpO2 95%   BMI 39.72 kg/m   Vitals:   07/08/24 1327  BP: 110/60  Pulse: 68  Temp: 97.7 F (36.5 C)  Height: 5' 8 (1.727 m)  Weight: 261 lb 3.2 oz (118.5 kg)  SpO2: 95%  BMI (Calculated): 39.72    Physical Exam   Results for orders placed or performed in visit on 07/08/24  POCT CBG (Fasting - Glucose)  Result Value Ref Range   Glucose Fasting, POC 161 (A) 70 - 99 mg/dL    Recent Results (from the past 2160 hours)  Drug Screen 13 with reflex Confirmation (AMP,BAR,BZO,COC,PCP,THC,OPI,OXY,MD,FEN,MEP,PPX,TRAM), Serum     Status: Abnormal   Collection Time: 04/18/24  2:58 PM  Result Value Ref Range   Amphetamines, IA Negative Cutoff:50 ng/mL   Barbiturates, IA Negative Cutoff:0.1 ug/mL   Benzodiazepines, IA Negative Cutoff:20 ng/mL   Cocaine & Metabolite, IA Negative Cutoff:25 ng/mL   Phencyclidine, IA Negative Cutoff:8 ng/mL   THC(Marijuana) Metabolite, IA Negative Cutoff:5 ng/mL   Opiates, IA ++POSITIVE++ (A) Cutoff:5 ng/mL   Oxycodones, IA Negative Cutoff:5 ng/mL    Comment: Presumptive immunoassay result indicated need for further testing; definitive confirmation was negative.    Methadone, IA Negative Cutoff:25 ng/mL   FENTANYL, IA Negative Cutoff:1.0 ng/mL   Propoxyphene, IA Negative Cutoff:50 ng/mL   MEPERIDINE, IA Negative Cutoff:100 ng/mL   TRAMADOL, IA Negative Cutoff:50 ng/mL    Comment: This test was developed and its performance characteristics determined by [facility].  It has not been cleared or approved by the Food and Drug Administration.   Lipid panel      Status: Abnormal   Collection Time: 04/18/24  2:58 PM  Result Value Ref Range   Cholesterol, Total 150 100 - 199 mg/dL   Triglycerides 882 0 - 149 mg/dL   HDL 37 (L) >60 mg/dL   VLDL Cholesterol Cal 21 5 - 40 mg/dL   LDL Chol Calc (NIH) 92 0 - 99 mg/dL   Chol/HDL Ratio 4.1 0.0 - 4.4 ratio    Comment:  T. Chol/HDL Ratio                                             Men  Women                               1/2 Avg.Risk  3.4    3.3                                   Avg.Risk  5.0    4.4                                2X Avg.Risk  9.6    7.1                                3X Avg.Risk 23.4   11.0   Hemoglobin A1c     Status: Abnormal   Collection Time: 04/18/24  2:58 PM  Result Value Ref Range   Hgb A1c MFr Bld 7.8 (H) 4.8 - 5.6 %    Comment:          Prediabetes: 5.7 - 6.4          Diabetes: >6.4          Glycemic control for adults with diabetes: <7.0    Est. average glucose Bld gHb Est-mCnc 177 mg/dL  Opiates,MS,WB/Sp Rfx     Status: None   Collection Time: 04/18/24  2:58 PM  Result Value Ref Range   Opiate Confirmation Positive    Codeine Negative ng/mL   Morphine  19.7 ng/mL   6-Acetylmorphine Negative    Hydrocodone Negative ng/mL   Hydromorphone Negative ng/mL   Dihydrocodeine Negative ng/mL    Comment: Expected metabolism of opiate class drugs:   Parent Drug       Detected Metabolites  -----------       --------------------  Codeine:          Major:  Morphine                     Minor:  Hydrocodone, Hydromorphone,                            Dihydrocodeine  Morphine :         Minor:  Hydromorphone  Hydrocodone:      Hydromorphone, Dihydrocodeine  Hydromorphone:    None  Dihydrocodeine:   None  Heroin:           6-Acetylmorphine                    Morphine                     Codeine, in small amounts in comparison                     to morphine , is often detected when                     heroin is the source drug. Confirmation threshold:  1.0 ng/mL   Oxycodones,MS,WB/Sp Rfx  Status: None   Collection Time: 04/18/24  2:58 PM  Result Value Ref Range   Oxycodones Confirmation Negative    Oxycocone Negative ng/mL   Oxymorphone Negative ng/mL    Comment: Confirmation threshold: 1.0 ng/mL  POCT CBG (Fasting - Glucose)     Status: Abnormal   Collection Time: 06/17/24  1:27 PM  Result Value Ref Range   Glucose Fasting, POC 102 (A) 70 - 99 mg/dL  POCT urinalysis dipstick     Status: Abnormal   Collection Time: 06/17/24  1:35 PM  Result Value Ref Range   Color, UA     Clarity, UA     Glucose, UA Negative Negative   Bilirubin, UA negative    Ketones, UA negative    Spec Grav, UA 1.025 1.010 - 1.025   Blood, UA negative    pH, UA 6.0 5.0 - 8.0   Protein, UA Positive (R) Negative   Urobilinogen, UA negative (A) 0.2 or 1.0 E.U./dL   Nitrite, UA     Leukocytes, UA Trace (A) Negative   Appearance     Odor    POCT Urinalysis Dipstick (18997)     Status: Abnormal   Collection Time: 06/17/24  1:50 PM  Result Value Ref Range   Color, UA     Clarity, UA     Glucose, UA Negative Negative   Bilirubin, UA Negative    Ketones, UA Negative    Spec Grav, UA 1.025 1.010 - 1.025   Blood, UA Negative    pH, UA 6.0 5.0 - 8.0   Protein, UA Positive (A) Negative   Urobilinogen, UA 0.2 0.2 or 1.0 E.U./dL   Nitrite, UA Negative    Leukocytes, UA Trace (A) Negative   Appearance     Odor    CBC with Differential     Status: Abnormal   Collection Time: 06/17/24 11:20 PM  Result Value Ref Range   WBC 7.4 4.0 - 10.5 K/uL   RBC 3.95 3.87 - 5.11 MIL/uL   Hemoglobin 12.4 12.0 - 15.0 g/dL   HCT 64.1 (L) 63.9 - 53.9 %   MCV 90.6 80.0 - 100.0 fL   MCH 31.4 26.0 - 34.0 pg   MCHC 34.6 30.0 - 36.0 g/dL   RDW 81.7 (H) 88.4 - 84.4 %   Platelets 83 (L) 150 - 400 K/uL    Comment: Immature Platelet Fraction may be clinically indicated, consider ordering this additional test [id number] PLATELET COUNT CONFIRMED BY SMEAR    nRBC 0.0 0.0 -  0.2 %   Neutrophils Relative % 40 %   Neutro Abs 2.9 1.7 - 7.7 K/uL   Lymphocytes Relative 39 %   Lymphs Abs 3.0 0.7 - 4.0 K/uL   Monocytes Relative 12 %   Monocytes Absolute 0.9 0.1 - 1.0 K/uL   Eosinophils Relative 7 %   Eosinophils Absolute 0.5 0.0 - 0.5 K/uL   Basophils Relative 2 %   Basophils Absolute 0.1 0.0 - 0.1 K/uL   Immature Granulocytes 0 %   Abs Immature Granulocytes 0.03 0.00 - 0.07 K/uL    Comment: Performed at [facility], [address] [place], [place] [address]  Basic metabolic panel     Status: Abnormal   Collection Time: 06/17/24 11:20 PM  Result Value Ref Range   Sodium 139 135 - 145 mmol/L   Potassium 4.2 3.5 - 5.1 mmol/L   Chloride 106 98 - 111 mmol/L   CO2 24 22 - 32 mmol/L  Glucose, Bld 171 (H) 70 - 99 mg/dL    Comment: Glucose reference range applies only to samples taken after fasting for at least 8 hours.   BUN 12 8 - 23 mg/dL   Creatinine, Ser 9.22 0.44 - 1.00 mg/dL   Calcium  8.5 (L) 8.9 - 10.3 mg/dL   GFR, Estimated >39 >39 mL/min    Comment: (NOTE) Calculated using the CKD-EPI Creatinine Equation (2021)    Anion gap 9 5 - 15    Comment: Performed at [facility], [address] [place], [place] [address]  Troponin I (High Sensitivity)     Status: None   Collection Time: 06/17/24 11:20 PM  Result Value Ref Range   Troponin I (High Sensitivity) 8 <18 ng/L    Comment: (NOTE) Elevated high sensitivity troponin I (hsTnI) values and significant  changes across serial measurements may suggest ACS but many other  chronic and acute conditions are known to elevate hsTnI results.  Refer to the Links section for chest pain algorithms and additional  guidance. Performed at [facility], [address] [place], [place] [address]   POCT CBG (Fasting - Glucose)     Status: Abnormal   Collection Time: 07/08/24  1:45 PM  Result Value Ref Range   Glucose Fasting, POC 161 (A) 70 - 99 mg/dL      Assessment & Plan:   As per problem list.  Problem List Items Addressed This Visit       Endocrine   Diabetes mellitus without complication (HCC)     Other   SOB (shortness of breath) - Primary   Relevant Orders   DG Chest 2 View   Other Visit Diagnoses       Diabetes 1.5, managed as type 2 (HCC)       Relevant Orders   POCT CBG (Fasting - Glucose) (Completed)     Seasonal allergic rhinitis due to pollen       Relevant Medications   fluticasone  (FLONASE ) 50 MCG/ACT nasal spray   cetirizine  (ZYRTEC  ALLERGY) 10 MG tablet       Return if symptoms worsen or fail to improve.   Total time spent: 20 minutes  Sherrill Cinderella Perry, MD  07/08/2024   This document may have been prepared by [facility] Voice Recognition software and as such may include unintentional dictation errors.

## 2024-07-09 ENCOUNTER — Ambulatory Visit: Payer: Self-pay | Admitting: Internal Medicine

## 2024-07-09 ENCOUNTER — Ambulatory Visit
Admission: RE | Admit: 2024-07-09 | Discharge: 2024-07-09 | Disposition: A | Discharge status: Home / Self Care | Attending: Internal Medicine | Admitting: Internal Medicine

## 2024-07-09 ENCOUNTER — Ambulatory Visit
Admission: RE | Admit: 2024-07-09 | Discharge: 2024-07-09 | Disposition: A | Discharge status: Home / Self Care | Source: Ambulatory Visit | Attending: Internal Medicine | Admitting: Internal Medicine

## 2024-07-09 ENCOUNTER — Other Ambulatory Visit: Payer: Self-pay | Admitting: Internal Medicine

## 2024-07-09 DIAGNOSIS — R058 Other specified cough: Secondary | ICD-10-CM | POA: Diagnosis not present

## 2024-07-09 DIAGNOSIS — R918 Other nonspecific abnormal finding of lung field: Secondary | ICD-10-CM | POA: Diagnosis not present

## 2024-07-09 DIAGNOSIS — J209 Acute bronchitis, unspecified: Secondary | ICD-10-CM

## 2024-07-09 DIAGNOSIS — R0602 Shortness of breath: Secondary | ICD-10-CM | POA: Insufficient documentation

## 2024-07-09 DIAGNOSIS — J40 Bronchitis, not specified as acute or chronic: Secondary | ICD-10-CM | POA: Diagnosis not present

## 2024-07-09 MED ORDER — PREDNISONE 20 MG PO TABS: 40.0000 mg | ORAL_TABLET | Freq: Every day | ORAL | 0 refills | Status: AC

## 2024-07-09 MED ORDER — DOXYCYCLINE HYCLATE 100 MG PO TABS: 100.0000 mg | ORAL_TABLET | Freq: Two times a day (BID) | ORAL | 0 refills | Status: AC

## 2024-07-10 NOTE — Progress Notes (Signed)
 Patient notified

## 2024-07-11 ENCOUNTER — Other Ambulatory Visit: Payer: Self-pay

## 2024-07-11 DIAGNOSIS — J209 Acute bronchitis, unspecified: Secondary | ICD-10-CM

## 2024-07-11 MED ORDER — AZITHROMYCIN 250 MG PO TABS: ORAL_TABLET | ORAL | 0 refills | Status: AC

## 2024-07-19 ENCOUNTER — Ambulatory Visit: Admitting: Internal Medicine

## 2024-07-19 VITALS — BP 120/64 | HR 72 | Ht 68.0 in | Wt 233.8 lb

## 2024-07-19 DIAGNOSIS — E119 Type 2 diabetes mellitus without complications: Secondary | ICD-10-CM | POA: Diagnosis not present

## 2024-07-19 DIAGNOSIS — Z0130 Encounter for examination of blood pressure without abnormal findings: Secondary | ICD-10-CM

## 2024-07-19 DIAGNOSIS — E1165 Type 2 diabetes mellitus with hyperglycemia: Secondary | ICD-10-CM | POA: Diagnosis not present

## 2024-07-19 LAB — POCT CBG (FASTING - GLUCOSE)-MANUAL ENTRY: Glucose Fasting, POC: 319 mg/dL — AB (ref 70–99)

## 2024-07-19 MED ORDER — TRESIBA FLEXTOUCH 200 UNIT/ML ~~LOC~~ SOPN: 110.0000 [IU] | PEN_INJECTOR | Freq: Every day | SUBCUTANEOUS | Status: DC

## 2024-07-19 MED ORDER — OZEMPIC (2 MG/DOSE) 8 MG/3ML ~~LOC~~ SOPN: 2.0000 mg | PEN_INJECTOR | SUBCUTANEOUS | 2 refills | Status: DC

## 2024-07-19 MED ORDER — PIOGLITAZONE HCL 45 MG PO TABS: 45.0000 mg | ORAL_TABLET | Freq: Every morning | ORAL | 1 refills | Status: DC

## 2024-07-19 NOTE — Progress Notes (Signed)
 Established Patient Office Visit  Subjective:  Patient ID: Yolanda Harris, female    DOB: [birth year]  Age: 68 y.o. MRN: [id number]  Chief Complaint  Patient presents with   Follow-up    6 week follow up    No new complaints, here for lab review and medication refills. Failed to have previsit labs. Denies any hypoglycemic episodes and home bg readings have been high. Recovered from recent URI.     No other concerns at this time.   Past Medical History:  Diagnosis Date   Diabetes mellitus without complication (HCC)    Hypertension    Sciatic leg pain     Past Surgical History:  Procedure Laterality Date   ABDOMINAL HYSTERECTOMY     HTN      Social History   Socioeconomic History   Marital status: Married    Spouse name: Not on file   Number of children: Not on file   Years of education: Not on file   Highest education level: Not on file  Occupational History   Not on file  Tobacco Use   Smoking status: Every Day    Current packs/day: 0.50    Types: Cigarettes   Smokeless tobacco: Never  Vaping Use   Vaping status: Never Used  Substance and Sexual Activity   Alcohol use: Not Currently   Drug use: Never   Sexual activity: Not on file  Other Topics Concern   Not on file  Social History Narrative   Not on file   Social Drivers of Health   Financial Resource Strain: Not on file  Food Insecurity: Not on file  Transportation Needs: Not on file  Physical Activity: Not on file  Stress: Not on file  Social Connections: Not on file  Intimate Partner Violence: Not on file    Family History  Problem Relation Age of Onset   Breast cancer Neg Hx     Allergies  Allergen Reactions   Amiodarone  Shortness Of Breath   Sulfa Antibiotics Rash    Outpatient Medications Prior to Visit  Medication Sig   Alcohol Swabs (DROPSAFE ALCOHOL PREP) 70 % PADS USE TO CLEAN FINGER BEFORE STICKING TO CHECK SUGAR AS DIRECTED   amiodarone  (PACERONE ) 200 MG tablet Take 2  tablets by mouth 2 (two) times daily. (Patient not taking: Reported on 07/08/2024)   amitriptyline (ELAVIL) 25 MG tablet TAKE 1 TABLET EVERY MORNING   amLODipine-benazepril (LOTREL) 5-20 MG capsule Take 1 capsule by mouth once daily   atorvastatin  (LIPITOR) 10 MG tablet TAKE 1 TABLET EVERY EVENING   cetirizine  (ZYRTEC  ALLERGY) 10 MG tablet Take 1 tablet (10 mg total) by mouth daily.   cyclobenzaprine  (FLEXERIL ) 10 MG tablet Take 1 tablet (10 mg total) by mouth 3 (three) times daily.   fluticasone  (FLONASE ) 50 MCG/ACT nasal spray Place 1 spray into both nostrils daily.   furosemide  (LASIX ) 40 MG tablet Take 1 tablet (40 mg total) by mouth daily.   glucose blood (RELION TRUE METRIX TEST STRIPS) test strip Use with device to check sugars  up to three times daily   meloxicam  (MOBIC ) 15 MG tablet TAKE 1 TABLET EVERY DAY   metoprolol  tartrate (LOPRESSOR ) 25 MG tablet TAKE 1 TABLET TWICE DAILY   pregabalin  (LYRICA ) 300 MG capsule Take 1 capsule (300 mg total) by mouth 2 (two) times daily.   TRUEplus Lancets 33G MISC TEST BLOOD SUGAR UP TO THREE TIMES DAILY   Zinc  Oxide 15 % CREA Apply to left leg wound  bid (Patient not taking: Reported on 07/08/2024)   [DISCONTINUED] celecoxib  (CELEBREX ) 400 MG capsule Take 1 capsule (400 mg total) by mouth daily after breakfast. (Patient taking differently: Take 400 mg by mouth as needed.)   [DISCONTINUED] insulin degludec  (TRESIBA  FLEXTOUCH) 200 UNIT/ML FlexTouch Pen Inject 110 Units into the skin daily. (Patient taking differently: Inject 110 Units into the skin as needed.)   [DISCONTINUED] pioglitazone  (ACTOS ) 45 MG tablet Take 1 tablet (45 mg total) by mouth every morning. (Patient not taking: Reported on 07/08/2024)   [DISCONTINUED] Semaglutide , 2 MG/DOSE, (OZEMPIC , 2 MG/DOSE,) 8 MG/3ML SOPN INJECT 2 MG UNDER THE SKIN ONCE WEEKLY (Patient not taking: Reported on 07/08/2024)   No facility-administered medications prior to visit.    Review of Systems  Constitutional:   Positive for weight loss (15 lbs).  HENT: Negative.    Eyes: Negative.   Respiratory:  Negative for cough and shortness of breath.   Cardiovascular: Negative.   Gastrointestinal: Negative.   Genitourinary: Negative.   Musculoskeletal:  Positive for joint pain (bilat knee pain worse on the right).  Skin: Negative.   Neurological: Negative.        Poor balance  Endo/Heme/Allergies: Negative.   Psychiatric/Behavioral: Negative.    All other systems reviewed and are negative.      Objective:   BP 120/64   Pulse 72   Ht 5' 8 (1.727 m)   Wt 233 lb 12.8 oz (106.1 kg)   SpO2 96%   BMI 35.55 kg/m   Vitals:   07/19/24 1429  BP: 120/64  Pulse: 72  Height: 5' 8 (1.727 m)  Weight: 233 lb 12.8 oz (106.1 kg)  SpO2: 96%  BMI (Calculated): 35.56    Physical Exam Vitals reviewed.  Constitutional:      General: She is not in acute distress.    Appearance: She is obese.     Comments: Contusion on rear scalp still visible  HENT:     Head: Normocephalic and atraumatic.     Nose: Nose normal.     Mouth/Throat:     Mouth: Mucous membranes are moist.  Eyes:     Extraocular Movements: Extraocular movements intact.     Pupils: Pupils are equal, round, and reactive to light.  Cardiovascular:     Rate and Rhythm: Normal rate and regular rhythm.     Heart sounds: No murmur heard. Pulmonary:     Effort: Pulmonary effort is normal.     Breath sounds: No rhonchi or rales.  Abdominal:     General: Abdomen is flat.     Palpations: There is no hepatomegaly, splenomegaly or mass.  Musculoskeletal:        General: No swelling. Normal range of motion.     Cervical back: Normal range of motion. No tenderness.     Right lower leg: Edema present.     Left lower leg: Edema present.     Left foot: Normal.     Comments: Left soft shoe  Skin:    General: Skin is warm and dry.     Findings: Wound present.     Comments: LLE, superficial clean base with granulating tissue  Neurological:      General: No focal deficit present.     Mental Status: She is alert and oriented to person, place, and time.     Cranial Nerves: No cranial nerve deficit.     Motor: No weakness.  Psychiatric:        Mood and Affect: Mood normal.  Behavior: Behavior normal.      Results for orders placed or performed in visit on 07/19/24  POCT CBG (Fasting - Glucose)  Result Value Ref Range   Glucose Fasting, POC 319 (A) 70 - 99 mg/dL    Recent Results (from the past 2160 hours)  POCT CBG (Fasting - Glucose)     Status: Abnormal   Collection Time: 06/17/24  1:27 PM  Result Value Ref Range   Glucose Fasting, POC 102 (A) 70 - 99 mg/dL  POCT urinalysis dipstick     Status: Abnormal   Collection Time: 06/17/24  1:35 PM  Result Value Ref Range   Color, UA     Clarity, UA     Glucose, UA Negative Negative   Bilirubin, UA negative    Ketones, UA negative    Spec Grav, UA 1.025 1.010 - 1.025   Blood, UA negative    pH, UA 6.0 5.0 - 8.0   Protein, UA Positive (R) Negative   Urobilinogen, UA negative (A) 0.2 or 1.0 E.U./dL   Nitrite, UA     Leukocytes, UA Trace (A) Negative   Appearance     Odor    POCT Urinalysis Dipstick (18997)     Status: Abnormal   Collection Time: 06/17/24  1:50 PM  Result Value Ref Range   Color, UA     Clarity, UA     Glucose, UA Negative Negative   Bilirubin, UA Negative    Ketones, UA Negative    Spec Grav, UA 1.025 1.010 - 1.025   Blood, UA Negative    pH, UA 6.0 5.0 - 8.0   Protein, UA Positive (A) Negative   Urobilinogen, UA 0.2 0.2 or 1.0 E.U./dL   Nitrite, UA Negative    Leukocytes, UA Trace (A) Negative   Appearance     Odor    CBC with Differential     Status: Abnormal   Collection Time: 06/17/24 11:20 PM  Result Value Ref Range   WBC 7.4 4.0 - 10.5 K/uL   RBC 3.95 3.87 - 5.11 MIL/uL   Hemoglobin 12.4 12.0 - 15.0 g/dL   HCT 64.1 (L) 63.9 - 53.9 %   MCV 90.6 80.0 - 100.0 fL   MCH 31.4 26.0 - 34.0 pg   MCHC 34.6 30.0 - 36.0 g/dL   RDW 81.7  (H) 88.4 - 15.5 %   Platelets 83 (L) 150 - 400 K/uL    Comment: Immature Platelet Fraction may be clinically indicated, consider ordering this additional test [id number] PLATELET COUNT CONFIRMED BY SMEAR    nRBC 0.0 0.0 - 0.2 %   Neutrophils Relative % 40 %   Neutro Abs 2.9 1.7 - 7.7 K/uL   Lymphocytes Relative 39 %   Lymphs Abs 3.0 0.7 - 4.0 K/uL   Monocytes Relative 12 %   Monocytes Absolute 0.9 0.1 - 1.0 K/uL   Eosinophils Relative 7 %   Eosinophils Absolute 0.5 0.0 - 0.5 K/uL   Basophils Relative 2 %   Basophils Absolute 0.1 0.0 - 0.1 K/uL   Immature Granulocytes 0 %   Abs Immature Granulocytes 0.03 0.00 - 0.07 K/uL    Comment: Performed at [facility], [address] [place], [place] [address]  Basic metabolic panel     Status: Abnormal   Collection Time: 06/17/24 11:20 PM  Result Value Ref Range   Sodium 139 135 - 145 mmol/L   Potassium 4.2 3.5 - 5.1 mmol/L   Chloride 106 98 - 111  mmol/L   CO2 24 22 - 32 mmol/L   Glucose, Bld 171 (H) 70 - 99 mg/dL    Comment: Glucose reference range applies only to samples taken after fasting for at least 8 hours.   BUN 12 8 - 23 mg/dL   Creatinine, Ser 9.22 0.44 - 1.00 mg/dL   Calcium  8.5 (L) 8.9 - 10.3 mg/dL   GFR, Estimated >39 >39 mL/min    Comment: (NOTE) Calculated using the CKD-EPI Creatinine Equation (2021)    Anion gap 9 5 - 15    Comment: Performed at [facility], [address] [place], [place] [address]  Troponin I (High Sensitivity)     Status: None   Collection Time: 06/17/24 11:20 PM  Result Value Ref Range   Troponin I (High Sensitivity) 8 <18 ng/L    Comment: (NOTE) Elevated high sensitivity troponin I (hsTnI) values and significant  changes across serial measurements may suggest ACS but many other  chronic and acute conditions are known to elevate hsTnI results.  Refer to the Links section for chest pain algorithms and additional  guidance. Performed at [facility], [address] [place], [place] [address]   POCT CBG (Fasting - Glucose)     Status: Abnormal   Collection Time: 07/08/24  1:45 PM  Result Value Ref Range   Glucose Fasting, POC 161 (A) 70 - 99 mg/dL  POCT CBG (Fasting - Glucose)     Status: Abnormal   Collection Time: 07/19/24  2:37 PM  Result Value Ref Range   Glucose Fasting, POC 319 (A) 70 - 99 mg/dL      Assessment & Plan:  Rosio was seen today for follow-up.  Diabetes mellitus without complication (HCC) -     POCT CBG (Fasting - Glucose) -     Hemoglobin A1c -     Ozempic  (2 MG/DOSE); Inject 2 mg into the skin once a week.  Dispense: 3 mL; Refill: 2 -     Pioglitazone  HCl; Take 1 tablet (45 mg total) by mouth every morning.  Dispense: 90 tablet; Refill: 1 -     Tresiba  FlexTouch; Inject 110 Units into the skin daily.    Problem List Items Addressed This Visit       Endocrine   Diabetes mellitus without complication (HCC) - Primary   Relevant Medications   Semaglutide , 2 MG/DOSE, (OZEMPIC , 2 MG/DOSE,) 8 MG/3ML SOPN   pioglitazone  (ACTOS ) 45 MG tablet   insulin degludec  (TRESIBA  FLEXTOUCH) 200 UNIT/ML FlexTouch Pen   Other Relevant Orders   POCT CBG (Fasting - Glucose) (Completed)    Return if symptoms worsen or fail to improve.   Total time spent: 20 minutes  Sherrill Cinderella Perry, MD  07/19/2024   This document may have been prepared by [facility] Voice Recognition software and as such may include unintentional dictation errors.

## 2024-07-24 ENCOUNTER — Telehealth: Payer: Self-pay

## 2024-07-24 LAB — HEMOGLOBIN A1C
Est. average glucose Bld gHb Est-mCnc: 157 mg/dL
Hgb A1c MFr Bld: 7.1 % — ABNORMAL HIGH (ref 4.8–5.6)

## 2024-07-24 NOTE — Telephone Encounter (Signed)
 Pt called requesting a cream for her rash that was discussed at her appt last Friday be sent in as it is getting worse.

## 2024-07-26 ENCOUNTER — Telehealth: Payer: Self-pay

## 2024-07-26 ENCOUNTER — Other Ambulatory Visit: Payer: Self-pay | Admitting: Internal Medicine

## 2024-07-26 ENCOUNTER — Other Ambulatory Visit: Payer: Self-pay

## 2024-07-26 MED ORDER — NYSTATIN 100000 UNIT/GM EX CREA: 1.0000 | TOPICAL_CREAM | Freq: Two times a day (BID) | CUTANEOUS | 0 refills | Status: AC

## 2024-07-26 NOTE — Telephone Encounter (Signed)
 Pt called requesting a call back from you regarding rx refills please advise

## 2024-07-29 ENCOUNTER — Other Ambulatory Visit: Payer: Self-pay

## 2024-07-29 DIAGNOSIS — E119 Type 2 diabetes mellitus without complications: Secondary | ICD-10-CM

## 2024-07-29 DIAGNOSIS — J301 Allergic rhinitis due to pollen: Secondary | ICD-10-CM

## 2024-07-29 DIAGNOSIS — R600 Localized edema: Secondary | ICD-10-CM

## 2024-07-29 DIAGNOSIS — G894 Chronic pain syndrome: Secondary | ICD-10-CM

## 2024-07-29 MED ORDER — PREGABALIN 300 MG PO CAPS: 300.0000 mg | ORAL_CAPSULE | Freq: Two times a day (BID) | ORAL | 1 refills | Status: DC

## 2024-07-29 MED ORDER — CETIRIZINE HCL 10 MG PO TABS: 10.0000 mg | ORAL_TABLET | Freq: Every day | ORAL | 2 refills | Status: DC

## 2024-07-29 MED ORDER — FUROSEMIDE 40 MG PO TABS: 40.0000 mg | ORAL_TABLET | Freq: Every day | ORAL | 0 refills | Status: DC

## 2024-07-30 ENCOUNTER — Ambulatory Visit (INDEPENDENT_AMBULATORY_CARE_PROVIDER_SITE_OTHER): Admitting: Internal Medicine

## 2024-07-30 ENCOUNTER — Ambulatory Visit: Payer: Self-pay | Admitting: Internal Medicine

## 2024-07-30 VITALS — BP 116/68 | HR 72 | Temp 97.7°F | Ht 68.0 in | Wt 250.4 lb

## 2024-07-30 DIAGNOSIS — E139 Other specified diabetes mellitus without complications: Secondary | ICD-10-CM

## 2024-07-30 DIAGNOSIS — I10 Essential (primary) hypertension: Secondary | ICD-10-CM | POA: Diagnosis not present

## 2024-07-30 DIAGNOSIS — G250 Essential tremor: Secondary | ICD-10-CM

## 2024-07-30 DIAGNOSIS — R600 Localized edema: Secondary | ICD-10-CM

## 2024-07-30 DIAGNOSIS — E119 Type 2 diabetes mellitus without complications: Secondary | ICD-10-CM

## 2024-07-30 DIAGNOSIS — G894 Chronic pain syndrome: Secondary | ICD-10-CM

## 2024-07-30 LAB — POCT CBG (FASTING - GLUCOSE)-MANUAL ENTRY: Glucose Fasting, POC: 259 mg/dL — AB (ref 70–99)

## 2024-07-30 LAB — POC CREATINE & ALBUMIN,URINE
Albumin/Creatinine Ratio, Urine, POC: <30
Creatinine, POC: 10 mg/dL
Microalbumin Ur, POC: 50 mg/L

## 2024-07-30 MED ORDER — CYCLOBENZAPRINE HCL 10 MG PO TABS: 10.0000 mg | ORAL_TABLET | Freq: Three times a day (TID) | ORAL | 0 refills | Status: DC

## 2024-07-30 MED ORDER — PROPRANOLOL HCL 10 MG PO TABS: 10.0000 mg | ORAL_TABLET | Freq: Two times a day (BID) | ORAL | 11 refills | Status: DC

## 2024-07-30 MED ORDER — AMLODIPINE BESY-BENAZEPRIL HCL 5-20 MG PO CAPS: 1.0000 | ORAL_CAPSULE | Freq: Every day | ORAL | 0 refills | Status: DC

## 2024-07-30 MED ORDER — TRESIBA FLEXTOUCH 200 UNIT/ML ~~LOC~~ SOPN: 116.0000 [IU] | PEN_INJECTOR | Freq: Every day | SUBCUTANEOUS | Status: DC

## 2024-07-30 MED ORDER — FUROSEMIDE 40 MG PO TABS: 40.0000 mg | ORAL_TABLET | Freq: Every day | ORAL | 0 refills | Status: DC

## 2024-07-30 NOTE — Telephone Encounter (Signed)
 Left VM to return call

## 2024-07-30 NOTE — Progress Notes (Signed)
 Established Patient Office Visit  Subjective:  Patient ID: Yolanda Harris, female    DOB: 08/02/1956  Age: 68 y.o. MRN: [id number]  Chief Complaint  Patient presents with   Follow-up    Needs ckd, PM    Here for pain management follow up. Chronic pain has improved and mainly uses tylenol for analgesia. Last drug screen satisfactory and pill counts have also been satisfactory. C/o wheezing last night but denies any cough. A1c has improved but still not at target but admits to dietary indiscretion.     No other concerns at this time.   Past Medical History:  Diagnosis Date   Diabetes mellitus without complication (HCC)    Hypertension    Sciatic leg pain     Past Surgical History:  Procedure Laterality Date   ABDOMINAL HYSTERECTOMY     HTN      Social History   Socioeconomic History   Marital status: Married    Spouse name: Not on file   Number of children: Not on file   Years of education: Not on file   Highest education level: Not on file  Occupational History   Not on file  Tobacco Use   Smoking status: Every Day    Current packs/day: 0.50    Types: Cigarettes   Smokeless tobacco: Never  Vaping Use   Vaping status: Never Used  Substance and Sexual Activity   Alcohol use: Not Currently   Drug use: Never   Sexual activity: Not on file  Other Topics Concern   Not on file  Social History Narrative   Not on file   Social Drivers of Health   Financial Resource Strain: Not on file  Food Insecurity: Not on file  Transportation Needs: Not on file  Physical Activity: Not on file  Stress: Not on file  Social Connections: Not on file  Intimate Partner Violence: Not on file    Family History  Problem Relation Age of Onset   Breast cancer Neg Hx     Allergies  Allergen Reactions   Amiodarone  Shortness Of Breath   Sulfa Antibiotics Rash    Outpatient Medications Prior to Visit  Medication Sig   Alcohol Swabs (DROPSAFE ALCOHOL PREP) 70 % PADS  USE TO CLEAN FINGER BEFORE STICKING TO CHECK SUGAR AS DIRECTED   amitriptyline (ELAVIL) 25 MG tablet TAKE 1 TABLET EVERY MORNING   atorvastatin  (LIPITOR) 10 MG tablet TAKE 1 TABLET EVERY EVENING   cetirizine  (ZYRTEC  ALLERGY) 10 MG tablet Take 1 tablet (10 mg total) by mouth daily.   glucose blood (RELION TRUE METRIX TEST STRIPS) test strip Use with device to check sugars  up to three times daily   meloxicam  (MOBIC ) 15 MG tablet TAKE 1 TABLET EVERY DAY   metoprolol  tartrate (LOPRESSOR ) 25 MG tablet TAKE 1 TABLET TWICE DAILY   nystatin  cream (MYCOSTATIN ) Apply 1 Application topically 2 (two) times daily for 15 days. Apply to affected area   pioglitazone  (ACTOS ) 45 MG tablet Take 1 tablet (45 mg total) by mouth every morning.   pregabalin  (LYRICA ) 300 MG capsule Take 1 capsule (300 mg total) by mouth 2 (two) times daily.   Semaglutide , 2 MG/DOSE, (OZEMPIC , 2 MG/DOSE,) 8 MG/3ML SOPN Inject 2 mg into the skin once a week.   TRUEplus Lancets 33G MISC TEST BLOOD SUGAR UP TO THREE TIMES DAILY   [DISCONTINUED] amLODipine -benazepril  (LOTREL) 5-20 MG capsule Take 1 capsule by mouth once daily   [DISCONTINUED] cyclobenzaprine  (FLEXERIL ) 10 MG tablet Take  1 tablet (10 mg total) by mouth 3 (three) times daily.   [DISCONTINUED] furosemide  (LASIX ) 40 MG tablet Take 1 tablet (40 mg total) by mouth daily.   [DISCONTINUED] insulin degludec  (TRESIBA  FLEXTOUCH) 200 UNIT/ML FlexTouch Pen Inject 110 Units into the skin daily.   amiodarone  (PACERONE ) 200 MG tablet Take 2 tablets by mouth 2 (two) times daily. (Patient not taking: Reported on 07/30/2024)   fluticasone  (FLONASE ) 50 MCG/ACT nasal spray Place 1 spray into both nostrils daily. (Patient not taking: Reported on 07/30/2024)   Zinc  Oxide 15 % CREA Apply to left leg wound bid (Patient not taking: Reported on 07/30/2024)   No facility-administered medications prior to visit.    Review of Systems  Constitutional:  Negative for weight loss.  HENT: Negative.    Eyes:  Negative.   Respiratory:  Positive for wheezing. Negative for cough and shortness of breath.   Cardiovascular: Negative.   Gastrointestinal: Negative.   Genitourinary: Negative.   Musculoskeletal:  Positive for joint pain (bilat knee pain worse on the right).  Skin: Negative.   Neurological: Negative.        Poor balance  Endo/Heme/Allergies: Negative.   Psychiatric/Behavioral: Negative.    All other systems reviewed and are negative.      Objective:   BP 116/68   Pulse 72   Temp 97.7 F (36.5 C)   Ht 5' 8 (1.727 m)   Wt 250 lb 6.4 oz (113.6 kg)   SpO2 94%   BMI 38.07 kg/m   Vitals:   07/30/24 1322  BP: 116/68  Pulse: 72  Temp: 97.7 F (36.5 C)  Height: 5' 8 (1.727 m)  Weight: 250 lb 6.4 oz (113.6 kg)  SpO2: 94%  BMI (Calculated): 38.08    Physical Exam Vitals reviewed.  Constitutional:      General: She is not in acute distress.    Appearance: She is obese.     Comments: Contusion on rear scalp still visible  HENT:     Head: Normocephalic and atraumatic.     Nose: Nose normal.     Mouth/Throat:     Mouth: Mucous membranes are moist.  Eyes:     Extraocular Movements: Extraocular movements intact.     Pupils: Pupils are equal, round, and reactive to light.  Cardiovascular:     Rate and Rhythm: Normal rate and regular rhythm.     Heart sounds: No murmur heard. Pulmonary:     Effort: Pulmonary effort is normal.     Breath sounds: No rhonchi or rales.  Abdominal:     General: Abdomen is flat.     Palpations: There is no hepatomegaly, splenomegaly or mass.  Musculoskeletal:        General: No swelling. Normal range of motion.     Cervical back: Normal range of motion. No tenderness.     Right lower leg: Edema present.     Left lower leg: Edema present.     Left foot: Normal.     Comments: Left soft shoe  Skin:    General: Skin is warm and dry.     Findings: Wound present.     Comments: LLE, superficial clean base with granulating tissue   Neurological:     General: No focal deficit present.     Mental Status: She is alert and oriented to person, place, and time.     Cranial Nerves: No cranial nerve deficit.     Motor: Tremor (bilateral and fine of both hands) present. No  weakness.  Psychiatric:        Mood and Affect: Mood normal.        Behavior: Behavior normal.      Results for orders placed or performed in visit on 07/30/24  POCT CBG (Fasting - Glucose)  Result Value Ref Range   Glucose Fasting, POC 259 (A) 70 - 99 mg/dL    Recent Results (from the past 2160 hours)  POCT CBG (Fasting - Glucose)     Status: Abnormal   Collection Time: 06/17/24  1:27 PM  Result Value Ref Range   Glucose Fasting, POC 102 (A) 70 - 99 mg/dL  POCT urinalysis dipstick     Status: Abnormal   Collection Time: 06/17/24  1:35 PM  Result Value Ref Range   Color, UA     Clarity, UA     Glucose, UA Negative Negative   Bilirubin, UA negative    Ketones, UA negative    Spec Grav, UA 1.025 1.010 - 1.025   Blood, UA negative    pH, UA 6.0 5.0 - 8.0   Protein, UA Positive (R) Negative   Urobilinogen, UA negative (A) 0.2 or 1.0 E.U./dL   Nitrite, UA     Leukocytes, UA Trace (A) Negative   Appearance     Odor    POCT Urinalysis Dipstick (18997)     Status: Abnormal   Collection Time: 06/17/24  1:50 PM  Result Value Ref Range   Color, UA     Clarity, UA     Glucose, UA Negative Negative   Bilirubin, UA Negative    Ketones, UA Negative    Spec Grav, UA 1.025 1.010 - 1.025   Blood, UA Negative    pH, UA 6.0 5.0 - 8.0   Protein, UA Positive (A) Negative   Urobilinogen, UA 0.2 0.2 or 1.0 E.U./dL   Nitrite, UA Negative    Leukocytes, UA Trace (A) Negative   Appearance     Odor    CBC with Differential     Status: Abnormal   Collection Time: 06/17/24 11:20 PM  Result Value Ref Range   WBC 7.4 4.0 - 10.5 K/uL   RBC 3.95 3.87 - 5.11 MIL/uL   Hemoglobin 12.4 12.0 - 15.0 g/dL   HCT 64.1 (L) 63.9 - 53.9 %   MCV 90.6 80.0 - 100.0  fL   MCH 31.4 26.0 - 34.0 pg   MCHC 34.6 30.0 - 36.0 g/dL   RDW 81.7 (H) 88.4 - 84.4 %   Platelets 83 (L) 150 - 400 K/uL    Comment: Immature Platelet Fraction may be clinically indicated, consider ordering this additional test [id number] PLATELET COUNT CONFIRMED BY SMEAR    nRBC 0.0 0.0 - 0.2 %   Neutrophils Relative % 40 %   Neutro Abs 2.9 1.7 - 7.7 K/uL   Lymphocytes Relative 39 %   Lymphs Abs 3.0 0.7 - 4.0 K/uL   Monocytes Relative 12 %   Monocytes Absolute 0.9 0.1 - 1.0 K/uL   Eosinophils Relative 7 %   Eosinophils Absolute 0.5 0.0 - 0.5 K/uL   Basophils Relative 2 %   Basophils Absolute 0.1 0.0 - 0.1 K/uL   Immature Granulocytes 0 %   Abs Immature Granulocytes 0.03 0.00 - 0.07 K/uL    Comment: Performed at [facility], [address], [place], [place] [address]  Basic metabolic panel     Status: Abnormal   Collection Time: 06/17/24 11:20 PM  Result Value Ref Range  Sodium 139 135 - 145 mmol/L   Potassium 4.2 3.5 - 5.1 mmol/L   Chloride 106 98 - 111 mmol/L   CO2 24 22 - 32 mmol/L   Glucose, Bld 171 (H) 70 - 99 mg/dL    Comment: Glucose reference range applies only to samples taken after fasting for at least 8 hours.   BUN 12 8 - 23 mg/dL   Creatinine, Ser 9.22 0.44 - 1.00 mg/dL   Calcium  8.5 (L) 8.9 - 10.3 mg/dL   GFR, Estimated >39 >39 mL/min    Comment: (NOTE) Calculated using the CKD-EPI Creatinine Equation (2021)    Anion gap 9 5 - 15    Comment: Performed at [facility], [address] [place], [place] [address]  Troponin I (High Sensitivity)     Status: None   Collection Time: 06/17/24 11:20 PM  Result Value Ref Range   Troponin I (High Sensitivity) 8 <18 ng/L    Comment: (NOTE) Elevated high sensitivity troponin I (hsTnI) values and significant  changes across serial measurements may suggest ACS but many other  chronic and acute conditions are known to elevate hsTnI results.  Refer to the Links section for chest pain algorithms  and additional  guidance. Performed at [facility], [address] [place], [place] [address]   POCT CBG (Fasting - Glucose)     Status: Abnormal   Collection Time: 07/08/24  1:45 PM  Result Value Ref Range   Glucose Fasting, POC 161 (A) 70 - 99 mg/dL  POCT CBG (Fasting - Glucose)     Status: Abnormal   Collection Time: 07/19/24  2:37 PM  Result Value Ref Range   Glucose Fasting, POC 319 (A) 70 - 99 mg/dL  Hemoglobin J8r     Status: Abnormal   Collection Time: 07/19/24  2:58 PM  Result Value Ref Range   Hgb A1c MFr Bld 7.1 (H) 4.8 - 5.6 %    Comment:          Prediabetes: 5.7 - 6.4          Diabetes: >6.4          Glycemic control for adults with diabetes: <7.0    Est. average glucose Bld gHb Est-mCnc 157 mg/dL  POCT CBG (Fasting - Glucose)     Status: Abnormal   Collection Time: 07/30/24  1:32 PM  Result Value Ref Range   Glucose Fasting, POC 259 (A) 70 - 99 mg/dL      Assessment & Plan:  Reduce percocet to 1 daily prn and send a new Rx in 2 months. Stricter low calorie diet, low cholesterol and low fat diet and exercise as much as possible.  Problem List Items Addressed This Visit       Cardiovascular and Mediastinum   Primary hypertension   Relevant Medications   furosemide  (LASIX ) 40 MG tablet   amLODipine -benazepril  (LOTREL) 5-20 MG capsule     Endocrine   Diabetes mellitus without complication (HCC)   Relevant Medications   amLODipine -benazepril  (LOTREL) 5-20 MG capsule   insulin degludec  (TRESIBA  FLEXTOUCH) 200 UNIT/ML FlexTouch Pen   Other Relevant Orders   Fructosamine     Other   Chronic pain syndrome - Primary   Relevant Medications   cyclobenzaprine  (FLEXERIL ) 10 MG tablet   Other Visit Diagnoses       Diabetes 1.5, managed as type 2 (HCC)       Relevant Medications   amLODipine -benazepril  (LOTREL) 5-20 MG capsule   insulin degludec  (  TRESIBA  FLEXTOUCH) 200 UNIT/ML FlexTouch Pen   Other Relevant Orders   POCT CBG (Fasting - Glucose)  (Completed)     Localized edema       Relevant Medications   furosemide  (LASIX ) 40 MG tablet     Essential tremor       Relevant Orders   Ambulatory referral to Neurology       Return in about 6 weeks (around 09/10/2024) for awv with labs prior.   Total time spent: 20 minutes  Sherrill Cinderella Perry, MD  07/30/2024   This document may have been prepared by [facility] Voice Recognition software and as such may include unintentional dictation errors.

## 2024-07-31 NOTE — Telephone Encounter (Signed)
 Left VM to return call

## 2024-08-09 ENCOUNTER — Telehealth: Payer: Self-pay

## 2024-08-09 NOTE — Telephone Encounter (Signed)
 Patient called stating that when she was here for her last appt she asked for a abx for  yeast infection but it hasn't been sent in, can you send it to the pharmacy for her please

## 2024-08-12 NOTE — Progress Notes (Signed)
   08/12/2024  Patient ID: Yolanda Harris Brighter, female   DOB: 27-Jul-1956, 68 y.o.   MRN: [id number]  Pharmacy Quality Measure Review  This patient is appearing on a report for being at risk of failing the adherence measure for diabetes medications this calendar year.   Medication: Pioglitazone  Last fill date: 07/19/24 for 90 day supply  Insurance report was not up to date. No action needed at this time.   Jon VEAR Lindau, PharmD Clinical Pharmacist [phone]

## 2024-08-13 ENCOUNTER — Other Ambulatory Visit: Payer: Self-pay

## 2024-08-13 DIAGNOSIS — E119 Type 2 diabetes mellitus without complications: Secondary | ICD-10-CM

## 2024-08-13 MED ORDER — TRESIBA FLEXTOUCH 200 UNIT/ML ~~LOC~~ SOPN: 116.0000 [IU] | PEN_INJECTOR | Freq: Every day | SUBCUTANEOUS | Status: DC

## 2024-08-14 ENCOUNTER — Other Ambulatory Visit: Payer: Self-pay | Admitting: Internal Medicine

## 2024-08-14 DIAGNOSIS — E119 Type 2 diabetes mellitus without complications: Secondary | ICD-10-CM

## 2024-08-28 DIAGNOSIS — Z794 Long term (current) use of insulin: Secondary | ICD-10-CM | POA: Diagnosis not present

## 2024-08-28 DIAGNOSIS — E66812 Obesity, class 2: Secondary | ICD-10-CM | POA: Diagnosis not present

## 2024-08-28 DIAGNOSIS — Z6839 Body mass index (BMI) 39.0-39.9, adult: Secondary | ICD-10-CM | POA: Diagnosis not present

## 2024-08-28 DIAGNOSIS — E782 Mixed hyperlipidemia: Secondary | ICD-10-CM | POA: Diagnosis not present

## 2024-08-28 DIAGNOSIS — E11649 Type 2 diabetes mellitus with hypoglycemia without coma: Secondary | ICD-10-CM | POA: Diagnosis not present

## 2024-08-28 DIAGNOSIS — I10 Essential (primary) hypertension: Secondary | ICD-10-CM | POA: Diagnosis not present

## 2024-09-06 ENCOUNTER — Ambulatory Visit (INDEPENDENT_AMBULATORY_CARE_PROVIDER_SITE_OTHER): Admitting: Internal Medicine

## 2024-09-06 VITALS — BP 130/70 | HR 71 | Ht 68.0 in | Wt 263.0 lb

## 2024-09-06 DIAGNOSIS — I87319 Chronic venous hypertension (idiopathic) with ulcer of unspecified lower extremity: Secondary | ICD-10-CM

## 2024-09-06 DIAGNOSIS — E669 Obesity, unspecified: Secondary | ICD-10-CM | POA: Diagnosis not present

## 2024-09-06 DIAGNOSIS — I10 Essential (primary) hypertension: Secondary | ICD-10-CM

## 2024-09-06 DIAGNOSIS — E119 Type 2 diabetes mellitus without complications: Secondary | ICD-10-CM

## 2024-09-06 MED ORDER — FUROSEMIDE 40 MG PO TABS: 40.0000 mg | ORAL_TABLET | Freq: Two times a day (BID) | ORAL | 0 refills | Status: AC

## 2024-09-06 MED ORDER — OLMESARTAN MEDOXOMIL 40 MG PO TABS: 40.0000 mg | ORAL_TABLET | Freq: Every day | ORAL | 0 refills | Status: AC

## 2024-09-06 NOTE — Progress Notes (Signed)
 Established Patient Office Visit  Subjective:  Patient ID: Yolanda Harris, female    DOB: 07/23/1956  Age: 68 y.o. MRN: [id number]  Chief Complaint  Patient presents with   Acute Visit    Weeping legs     C/o one week h/o bilateral leg swelling, oozing and ulceration.    No other concerns at this time.   Past Medical History:  Diagnosis Date   Diabetes mellitus without complication (HCC)    Hypertension    Sciatic leg pain     Past Surgical History:  Procedure Laterality Date   ABDOMINAL HYSTERECTOMY     HTN      Social History   Socioeconomic History   Marital status: Married    Spouse name: Not on file   Number of children: Not on file   Years of education: Not on file   Highest education level: Not on file  Occupational History   Not on file  Tobacco Use   Smoking status: Every Day    Current packs/day: 0.50    Types: Cigarettes   Smokeless tobacco: Never  Vaping Use   Vaping status: Never Used  Substance and Sexual Activity   Alcohol use: Not Currently   Drug use: Never   Sexual activity: Not on file  Other Topics Concern   Not on file  Social History Narrative   Not on file   Social Drivers of Health   Financial Resource Strain: Not on file  Food Insecurity: Not on file  Transportation Needs: Not on file  Physical Activity: Not on file  Stress: Not on file  Social Connections: Not on file  Intimate Partner Violence: Not on file    Family History  Problem Relation Age of Onset   Breast cancer Neg Hx     Allergies  Allergen Reactions   Amiodarone  Shortness Of Breath   Sulfa Antibiotics Rash    Outpatient Medications Prior to Visit  Medication Sig   Alcohol Swabs (DROPSAFE ALCOHOL PREP) 70 % PADS USE TO CLEAN FINGER BEFORE STICKING TO CHECK SUGAR AS DIRECTED   amiodarone  (PACERONE ) 200 MG tablet Take 2 tablets by mouth 2 (two) times daily. (Patient not taking: Reported on 07/30/2024)   amitriptyline (ELAVIL) 25 MG tablet TAKE 1  TABLET EVERY MORNING   amLODipine -benazepril  (LOTREL) 5-20 MG capsule Take 1 capsule by mouth daily.   atorvastatin  (LIPITOR) 10 MG tablet TAKE 1 TABLET EVERY EVENING   cetirizine  (ZYRTEC  ALLERGY) 10 MG tablet Take 1 tablet (10 mg total) by mouth daily.   cyclobenzaprine  (FLEXERIL ) 10 MG tablet Take 1 tablet (10 mg total) by mouth 3 (three) times daily.   fluticasone  (FLONASE ) 50 MCG/ACT nasal spray Place 1 spray into both nostrils daily. (Patient not taking: Reported on 07/30/2024)   furosemide  (LASIX ) 40 MG tablet Take 1 tablet (40 mg total) by mouth daily.   glucose blood (RELION TRUE METRIX TEST STRIPS) test strip Use with device to check sugars  up to three times daily   meloxicam  (MOBIC ) 15 MG tablet TAKE 1 TABLET EVERY DAY   metoprolol  tartrate (LOPRESSOR ) 25 MG tablet TAKE 1 TABLET TWICE DAILY   pioglitazone  (ACTOS ) 45 MG tablet Take 1 tablet (45 mg total) by mouth every morning.   pregabalin  (LYRICA ) 300 MG capsule Take 1 capsule (300 mg total) by mouth 2 (two) times daily.   Semaglutide , 2 MG/DOSE, (OZEMPIC , 2 MG/DOSE,) 8 MG/3ML SOPN Inject 2 mg into the skin once a week.   TRESIBA  FLEXTOUCH 200  UNIT/ML FlexTouch Pen INJECT 96 UNITS SUBCUTANEOUSLY ONCE DAILY   TRUEplus Lancets 33G MISC TEST BLOOD SUGAR UP TO THREE TIMES DAILY   Zinc  Oxide 15 % CREA Apply to left leg wound bid (Patient not taking: Reported on 07/30/2024)   No facility-administered medications prior to visit.    Review of Systems  Constitutional:  Negative for weight loss.  HENT: Negative.    Eyes: Negative.   Respiratory:  Positive for shortness of breath and wheezing. Negative for cough.   Cardiovascular: Negative.  Negative for orthopnea.  Gastrointestinal: Negative.   Genitourinary: Negative.   Musculoskeletal:  Positive for joint pain (bilat knee pain worse on the right).  Skin: Negative.   Neurological: Negative.        Poor balance  Endo/Heme/Allergies: Negative.   Psychiatric/Behavioral: Negative.    All  other systems reviewed and are negative.      Objective:   BP 130/70   Pulse 71   Ht 5' 8 (1.727 m)   Wt 263 lb (119.3 kg)   SpO2 98%   BMI 39.99 kg/m   Vitals:   09/06/24 1203  BP: 130/70  Pulse: 71  Height: 5' 8 (1.727 m)  Weight: 263 lb (119.3 kg)  SpO2: 98%  BMI (Calculated): 40    Physical Exam Pulmonary:     Breath sounds: Examination of the right-lower field reveals rales. Examination of the left-lower field reveals rales. Rales present.  Musculoskeletal:     Right lower leg: 4+ Edema present.     Left lower leg: 4+ Edema present.      No results found for any visits on 09/06/24.      Assessment & Plan:  Detra was seen today for acute visit.  Venous ulcer of lower extremity due to chronic peripheral venous hypertension (HCC) -     Furosemide ; Take 1 tablet (40 mg total) by mouth 2 (two) times daily for 14 days.  Dispense: 28 tablet; Refill: 0 -     Ambulatory referral to Wound Clinic -     BMP8+Anion Gap  Diabetes mellitus without complication (HCC)  Primary hypertension -     Olmesartan Medoxomil; Take 1 tablet (40 mg total) by mouth daily.  Dispense: 30 tablet; Refill: 0  Obesity (BMI 30-39.9) -     TSH    Problem List Items Addressed This Visit       Cardiovascular and Mediastinum   Primary hypertension   Relevant Medications   furosemide  (LASIX ) 40 MG tablet   olmesartan (BENICAR) 40 MG tablet     Endocrine   Diabetes mellitus without complication (HCC)   Relevant Medications   olmesartan (BENICAR) 40 MG tablet   Other Visit Diagnoses       Venous ulcer of lower extremity due to chronic peripheral venous hypertension (HCC)    -  Primary   Relevant Medications   furosemide  (LASIX ) 40 MG tablet   Other Relevant Orders   Ambulatory referral to Wound Clinic   BMP8+Anion Gap     Obesity (BMI 30-39.9)       Relevant Orders   TSH       Return in about 10 days (around 09/16/2024) for fu with labs prior.   Total time spent:  30 minutes. This time includes review of previous notes and results and patient face to face interaction during today'Alante Weimann visit.    Sherrill Cinderella Perry, MD  09/06/2024   This document may have been prepared by [facility] Voice Recognition software and as  such may include unintentional dictation errors.

## 2024-09-12 ENCOUNTER — Other Ambulatory Visit: Payer: Self-pay

## 2024-09-12 ENCOUNTER — Emergency Department

## 2024-09-12 ENCOUNTER — Emergency Department
Admission: EM | Admit: 2024-09-12 | Discharge: 2024-09-12 | Disposition: A | Discharge status: Home / Self Care | Attending: Emergency Medicine | Admitting: Emergency Medicine

## 2024-09-12 DIAGNOSIS — S59911A Unspecified injury of right forearm, initial encounter: Secondary | ICD-10-CM | POA: Diagnosis present

## 2024-09-12 DIAGNOSIS — R350 Frequency of micturition: Secondary | ICD-10-CM | POA: Diagnosis not present

## 2024-09-12 DIAGNOSIS — R945 Abnormal results of liver function studies: Secondary | ICD-10-CM | POA: Diagnosis not present

## 2024-09-12 DIAGNOSIS — D696 Thrombocytopenia, unspecified: Secondary | ICD-10-CM | POA: Insufficient documentation

## 2024-09-12 DIAGNOSIS — I10 Essential (primary) hypertension: Secondary | ICD-10-CM | POA: Diagnosis not present

## 2024-09-12 DIAGNOSIS — M799 Soft tissue disorder, unspecified: Secondary | ICD-10-CM | POA: Diagnosis not present

## 2024-09-12 DIAGNOSIS — W19XXXA Unspecified fall, initial encounter: Secondary | ICD-10-CM

## 2024-09-12 DIAGNOSIS — S0990XA Unspecified injury of head, initial encounter: Secondary | ICD-10-CM

## 2024-09-12 DIAGNOSIS — R296 Repeated falls: Secondary | ICD-10-CM | POA: Insufficient documentation

## 2024-09-12 DIAGNOSIS — I872 Venous insufficiency (chronic) (peripheral): Secondary | ICD-10-CM | POA: Insufficient documentation

## 2024-09-12 DIAGNOSIS — I700 Atherosclerosis of aorta: Secondary | ICD-10-CM | POA: Diagnosis not present

## 2024-09-12 DIAGNOSIS — S0511XA Contusion of eyeball and orbital tissues, right eye, initial encounter: Secondary | ICD-10-CM | POA: Diagnosis not present

## 2024-09-12 DIAGNOSIS — R590 Localized enlarged lymph nodes: Secondary | ICD-10-CM | POA: Diagnosis not present

## 2024-09-12 DIAGNOSIS — S0081XA Abrasion of other part of head, initial encounter: Secondary | ICD-10-CM | POA: Diagnosis not present

## 2024-09-12 DIAGNOSIS — R220 Localized swelling, mass and lump, head: Secondary | ICD-10-CM | POA: Diagnosis not present

## 2024-09-12 DIAGNOSIS — S5011XA Contusion of right forearm, initial encounter: Secondary | ICD-10-CM | POA: Diagnosis not present

## 2024-09-12 DIAGNOSIS — M79601 Pain in right arm: Secondary | ICD-10-CM | POA: Diagnosis not present

## 2024-09-12 DIAGNOSIS — S0083XA Contusion of other part of head, initial encounter: Secondary | ICD-10-CM | POA: Insufficient documentation

## 2024-09-12 DIAGNOSIS — W1812XA Fall from or off toilet with subsequent striking against object, initial encounter: Secondary | ICD-10-CM | POA: Diagnosis not present

## 2024-09-12 DIAGNOSIS — R591 Generalized enlarged lymph nodes: Secondary | ICD-10-CM

## 2024-09-12 DIAGNOSIS — E119 Type 2 diabetes mellitus without complications: Secondary | ICD-10-CM | POA: Insufficient documentation

## 2024-09-12 DIAGNOSIS — S199XXA Unspecified injury of neck, initial encounter: Secondary | ICD-10-CM | POA: Diagnosis not present

## 2024-09-12 LAB — CBC WITH DIFFERENTIAL/PLATELET
Abs Immature Granulocytes: 0.02 K/uL (ref 0.00–0.07)
Basophils Absolute: 0.1 K/uL (ref 0.0–0.1)
Basophils Relative: 2 %
Eosinophils Absolute: 0.6 K/uL — ABNORMAL HIGH (ref 0.0–0.5)
Eosinophils Relative: 9 %
HCT: 38.4 % (ref 36.0–46.0)
Hemoglobin: 12.8 g/dL (ref 12.0–15.0)
Immature Granulocytes: 0 %
Lymphocytes Relative: 33 %
Lymphs Abs: 2.3 K/uL (ref 0.7–4.0)
MCH: 31.1 pg (ref 26.0–34.0)
MCHC: 33.3 g/dL (ref 30.0–36.0)
MCV: 93.4 fL (ref 80.0–100.0)
Monocytes Absolute: 1.2 K/uL — ABNORMAL HIGH (ref 0.1–1.0)
Monocytes Relative: 18 %
Neutro Abs: 2.6 K/uL (ref 1.7–7.7)
Neutrophils Relative %: 38 %
Platelets: 64 K/uL — ABNORMAL LOW (ref 150–400)
RBC: 4.11 MIL/uL (ref 3.87–5.11)
RDW: 17.9 % — ABNORMAL HIGH (ref 11.5–15.5)
WBC: 6.9 K/uL (ref 4.0–10.5)
nRBC: 0 % (ref 0.0–0.2)

## 2024-09-12 LAB — URINALYSIS, ROUTINE W REFLEX MICROSCOPIC
Bilirubin Urine: NEGATIVE
Glucose, UA: 50 mg/dL — AB
Hgb urine dipstick: NEGATIVE
Ketones, ur: NEGATIVE mg/dL
Leukocytes,Ua: NEGATIVE
Nitrite: NEGATIVE
Protein, ur: NEGATIVE mg/dL
Specific Gravity, Urine: 1.006 (ref 1.005–1.030)
pH: 8 (ref 5.0–8.0)

## 2024-09-12 LAB — COMPREHENSIVE METABOLIC PANEL WITH GFR
ALT: 18 U/L (ref 0–44)
AST: 42 U/L — ABNORMAL HIGH (ref 15–41)
Albumin: 2.6 g/dL — ABNORMAL LOW (ref 3.5–5.0)
Alkaline Phosphatase: 124 U/L (ref 38–126)
Anion gap: 7 (ref 5–15)
BUN: 8 mg/dL (ref 8–23)
CO2: 24 mmol/L (ref 22–32)
Calcium: 8.2 mg/dL — ABNORMAL LOW (ref 8.9–10.3)
Chloride: 109 mmol/L (ref 98–111)
Creatinine, Ser: 0.69 mg/dL (ref 0.44–1.00)
GFR, Estimated: >60 mL/min (ref >60)
Glucose, Bld: 168 mg/dL — ABNORMAL HIGH (ref 70–99)
Potassium: 3.6 mmol/L (ref 3.5–5.1)
Sodium: 140 mmol/L (ref 135–145)
Total Bilirubin: 1.9 mg/dL — ABNORMAL HIGH (ref 0.0–1.2)
Total Protein: 7.5 g/dL (ref 6.5–8.1)

## 2024-09-12 LAB — TROPONIN I (HIGH SENSITIVITY)
Troponin I (High Sensitivity): 5 ng/L (ref <18)
Troponin I (High Sensitivity): 5 ng/L (ref <18)

## 2024-09-12 MED ORDER — ACETAMINOPHEN 500 MG PO TABS
1000.0000 mg | ORAL_TABLET | Freq: Once | ORAL | Status: AC
  Administered 2024-09-12: 1000 mg via ORAL
  Filled 2024-09-12: qty 2

## 2024-09-12 MED ORDER — LIDOCAINE 5 % EX PTCH: 1.0000 | MEDICATED_PATCH | CUTANEOUS | Status: DC

## 2024-09-12 MED ORDER — ACETAMINOPHEN 500 MG PO TABS: 1000.0000 mg | ORAL_TABLET | Freq: Once | ORAL | Status: DC

## 2024-09-12 NOTE — Discharge Instructions (Addendum)
 Your workup for your fall was reassuring no evidence of injury your blood work was reassuring however you did have some incidental findings of some lymph nodes and a nasal mass that we will need follow-up with ENT, oncology.  Return to the ER for worsening symptoms, fever or any other concern  1. Superficial right periorbital soft tissue injury.  No acute facial fracture.  2. Thoracic inlet lymphadenopathy is visible, nonspecific but suspicious for  lymphoproliferative disorder such as Leukemia or Lymphoma. Recommend further  evaluation.  3. 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 Recommend routine follow-up  with ENT.

## 2024-09-12 NOTE — ED Notes (Signed)
 This EDT ambulated with pt around ED hallway. Pt needed very minor steadying assist.

## 2024-09-12 NOTE — ED Triage Notes (Signed)
 Patient wheeled to exam room with complaints of fall this morning. Patient states she fell asleep on the toilet and fell forward hitting her head on the cabinet. Denies LOC or use of blood thinners. Presents with hematoma to right side of forehead.

## 2024-09-12 NOTE — ED Provider Notes (Addendum)
 [facility] Provider Note    Event Date/Time   First MD Initiated Contact with Patient 09/12/24 [phone]     (approximate)   History   Fall   HPI  Yolanda Harris is a 68 y.o. female with history of venous stasis, diabetes, hypertension who comes in with concerns for a fall.  Patient reports that he on the toilet when they fell forward hitting her head on a cabinet.  Patient does have a hematoma to the right side of the forehead.  Patient reports that she thinks that she could just fall asleep but she is no exactly sure why she fell.  She reported that it happened this morning around 6 AM.  She was not on the ground for prolonged period of time.  She denies any chest pain, abdominal pain, shortness of breath.  Does report some increased urination but states that they had increased her Lasix  recently so she attributes it to that.  According to family she has had a couple more frequent falls.  I reviewed the notes were on 7/28 she also came in for a fall.  I also reviewed patient's office visit note from 09/06/2024 where patient's Lasix  was increased.   Tdap in 2023  Physical Exam   Triage Vital Signs: ED Triage Vitals  Encounter Vitals Group     BP 09/12/24 0644 (!) 121/59     Girls Systolic BP Percentile --      Girls Diastolic BP Percentile --      Boys Systolic BP Percentile --      Boys Diastolic BP Percentile --      Pulse Rate 09/12/24 0644 71     Resp 09/12/24 0644 16     Temp 09/12/24 0644 98.5 F (36.9 C)     Temp Source 09/12/24 0644 Oral     SpO2 09/12/24 0644 97 %     Weight 09/12/24 0640 262 lb 12.6 oz (119.2 kg)     Height 09/12/24 0640 5' 8 (1.727 m)     Head Circumference --      Peak Flow --      Pain Score 09/12/24 0640 5     Pain Loc --      Pain Education --      Exclude from Growth Chart --     Most recent vital signs: Vitals:   09/12/24 0644  BP: (!) 121/59  Pulse: 71  Resp: 16  Temp: 98.5 F (36.9 C)  SpO2: 97%      General: Awake, no distress.  CV:  Good peripheral perfusion.  Resp:  Normal effort.  Abd:  No distention.  Other:  Patient has abrasion above her right eye.  Pupils are reactive.  Extraocular movements are intact she has got some tenderness along the bottom of the right orbit.  She has an bruising noted on her right forearm but full range of motion and denies any discomfort with palpation.  No chest wall tenderness no abdominal tenderness.  She has got some signs of venous stasis noted on bilateral legs worse on the left but she is good good distal pulses. She does report some tenderness along her right shoulder and along her right elbow and does have some bruising on her right forearm.  Good distal pulse movements are intact.  ED Results / Procedures / Treatments   Labs (all labs ordered are listed, but only abnormal results are displayed) Labs Reviewed  CBC WITH DIFFERENTIAL/PLATELET  COMPREHENSIVE METABOLIC PANEL  WITH GFR  URINALYSIS, ROUTINE W REFLEX MICROSCOPIC  TROPONIN I (HIGH SENSITIVITY)     EKG  My interpretation of EKG:  Normal sinus rate of 80 without any ST elevation or T wave inversions, normal intervals.  Looks similar to prior EKG.  RADIOLOGY I have reviewed the ct personally and interpreted no evidence of intracranial hemorrhage   PROCEDURES:  Critical Care performed: No  Procedures   MEDICATIONS ORDERED IN ED: Medications  acetaminophen (TYLENOL) tablet 1,000 mg (1,000 mg Oral Given 09/12/24 0903)     IMPRESSION / MDM / ASSESSMENT AND PLAN / ED COURSE  I reviewed the triage vital signs and the nursing notes.   Patient's presentation is most consistent with acute presentation with potential threat to life or bodily function.   Differentials intracranial hemorrhage, cervical fracture, facial fractures, Electra abnormalities.  Seems less likely ACS. No signs of cellulitis on exam.  X-rays ordered evaluate for any fracture although patient  appears to be neurovascularly intact.  Patient declined any Tylenol, lidocaine patches.   CT head and neck negative for acute pathology 1. Superficial right periorbital soft tissue injury.  No acute facial fracture. 2. Thoracic inlet lymphadenopathy is visible, nonspecific but suspicious for lymphoproliferative disorder such as Leukemia or Lymphoma. Recommend further evaluation. 3. 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 Recommend routine follow-up with ENT.  X-rays were negative I suspect this is more likely just soreness from the fall .  LFTs were slightly elevated but similar to prior.  Troponins are negative x 2.  CBC with slightly low platelets but that is also similar to prior.  She has got no significant bleeding at this time.  Patient is up and ambulatory with a walker without any issues.  She did report some increased urine frequency although she attributes it to her Lasix  will get urine to make sure no UTI we will get a repeat troponin to make sure no ACS if these are negative patient feels comfortable with discharge home.  Did discuss with them incidental findings with the family and follow-up with ENT, oncology.  They expressed understanding and felt comfortable with plan.  Patient's repeat urine was negative for UTI and a repeat troponin was negative therefore they will follow-up outpatient with oncology, ENT and return to the ER for worsening symptoms or any other concerns.  The patient is on the cardiac monitor to evaluate for evidence of arrhythmia and/or significant heart rate changes.      FINAL CLINICAL IMPRESSION(S) / ED DIAGNOSES   Final diagnoses:  Fall, initial encounter  Injury of head, initial encounter     Rx / DC Orders   ED Discharge Orders     None        Note:  This document was prepared using Dragon voice recognition software and may include unintentional dictation errors.   Ernest Ronal BRAVO, MD 09/12/24 1030    Ernest Ronal BRAVO,  MD 09/12/24 [phone]

## 2024-09-13 ENCOUNTER — Other Ambulatory Visit

## 2024-09-13 DIAGNOSIS — E119 Type 2 diabetes mellitus without complications: Secondary | ICD-10-CM | POA: Diagnosis not present

## 2024-09-13 DIAGNOSIS — E669 Obesity, unspecified: Secondary | ICD-10-CM | POA: Diagnosis not present

## 2024-09-13 DIAGNOSIS — I87319 Chronic venous hypertension (idiopathic) with ulcer of unspecified lower extremity: Secondary | ICD-10-CM | POA: Diagnosis not present

## 2024-09-14 LAB — BMP8+ANION GAP
Anion Gap: 12 mmol/L (ref 10.0–18.0)
BUN/Creatinine Ratio: 9 — ABNORMAL LOW (ref 12–28)
BUN: 8 mg/dL (ref 8–27)
CO2: 24 mmol/L (ref 20–29)
Calcium: 8.8 mg/dL (ref 8.7–10.3)
Chloride: 103 mmol/L (ref 96–106)
Creatinine, Ser: 0.93 mg/dL (ref 0.57–1.00)
Glucose: 171 mg/dL — ABNORMAL HIGH (ref 70–99)
Potassium: 3.9 mmol/L (ref 3.5–5.2)
Sodium: 139 mmol/L (ref 134–144)
eGFR: 67 mL/min/1.73 (ref >59)

## 2024-09-14 LAB — TSH: TSH: 3.49 u[IU]/mL (ref 0.450–4.500)

## 2024-09-14 LAB — FRUCTOSAMINE: Fructosamine: 341 umol/L — ABNORMAL HIGH (ref 0–285)

## 2024-09-16 ENCOUNTER — Ambulatory Visit: Payer: Self-pay | Admitting: Internal Medicine

## 2024-09-16 ENCOUNTER — Encounter: Payer: Self-pay | Admitting: Internal Medicine

## 2024-09-16 ENCOUNTER — Ambulatory Visit (INDEPENDENT_AMBULATORY_CARE_PROVIDER_SITE_OTHER): Admitting: Internal Medicine

## 2024-09-16 VITALS — BP 110/74 | HR 69 | Ht 66.0 in | Wt 233.2 lb

## 2024-09-16 DIAGNOSIS — I10 Essential (primary) hypertension: Secondary | ICD-10-CM | POA: Diagnosis not present

## 2024-09-16 DIAGNOSIS — I87319 Chronic venous hypertension (idiopathic) with ulcer of unspecified lower extremity: Secondary | ICD-10-CM

## 2024-09-16 DIAGNOSIS — E1165 Type 2 diabetes mellitus with hyperglycemia: Secondary | ICD-10-CM | POA: Diagnosis not present

## 2024-09-16 DIAGNOSIS — R600 Localized edema: Secondary | ICD-10-CM

## 2024-09-16 DIAGNOSIS — E119 Type 2 diabetes mellitus without complications: Secondary | ICD-10-CM

## 2024-09-16 LAB — POCT CBG (FASTING - GLUCOSE)-MANUAL ENTRY: Glucose Fasting, POC: 333 mg/dL — AB (ref 70–99)

## 2024-09-16 NOTE — Progress Notes (Signed)
 Established Patient Office Visit  Subjective:  Patient ID: Yolanda Harris, female    DOB: 1956/08/30  Age: 68 y.o. MRN: [id number]  Chief Complaint  Patient presents with   Results    10 day follow up lab results    Here for edema f/u which has improved.Labs reviewed and notable for  normal renal function and potassium. Recent fall, went to ER but w/u negative and sent back home.  Labs reviewed and notable for uncontrolled diabetes, fructosamine A1c not at target however was underdosing ozempic  due to error in administration. Denies any hypoglycemic episodes and home bg readings have been high.       No other concerns at this time.   Past Medical History:  Diagnosis Date   Diabetes mellitus without complication (HCC)    Hypertension    Sciatic leg pain     Past Surgical History:  Procedure Laterality Date   ABDOMINAL HYSTERECTOMY     HTN      Social History   Socioeconomic History   Marital status: Married    Spouse name: Not on file   Number of children: Not on file   Years of education: Not on file   Highest education level: Not on file  Occupational History   Not on file  Tobacco Use   Smoking status: Former    Current packs/day: 0.50    Types: Cigarettes   Smokeless tobacco: Never  Vaping Use   Vaping status: Never Used  Substance and Sexual Activity   Alcohol use: Not Currently   Drug use: Never   Sexual activity: Not on file  Other Topics Concern   Not on file  Social History Narrative   Not on file   Social Drivers of Health   Financial Resource Strain: Not on file  Food Insecurity: Not on file  Transportation Needs: Not on file  Physical Activity: Not on file  Stress: Not on file  Social Connections: Not on file  Intimate Partner Violence: Not on file    Family History  Problem Relation Age of Onset   Breast cancer Neg Hx     Allergies  Allergen Reactions   Amiodarone  Shortness Of Breath   Sulfa Antibiotics Rash     Outpatient Medications Prior to Visit  Medication Sig   Alcohol Swabs (DROPSAFE ALCOHOL PREP) 70 % PADS USE TO CLEAN FINGER BEFORE STICKING TO CHECK SUGAR AS DIRECTED   amiodarone  (PACERONE ) 200 MG tablet Take 2 tablets by mouth 2 (two) times daily.   amitriptyline (ELAVIL) 25 MG tablet TAKE 1 TABLET EVERY MORNING   amLODipine -benazepril  (LOTREL) 5-20 MG capsule Take 1 capsule by mouth daily.   atorvastatin  (LIPITOR) 10 MG tablet TAKE 1 TABLET EVERY EVENING   cetirizine  (ZYRTEC  ALLERGY) 10 MG tablet Take 1 tablet (10 mg total) by mouth daily.   cyclobenzaprine  (FLEXERIL ) 10 MG tablet Take 1 tablet (10 mg total) by mouth 3 (three) times daily.   fluticasone  (FLONASE ) 50 MCG/ACT nasal spray Place 1 spray into both nostrils daily.   furosemide  (LASIX ) 40 MG tablet Take 1 tablet (40 mg total) by mouth daily.   furosemide  (LASIX ) 40 MG tablet Take 1 tablet (40 mg total) by mouth 2 (two) times daily for 14 days.   glucose blood (RELION TRUE METRIX TEST STRIPS) test strip Use with device to check sugars  up to three times daily   meloxicam  (MOBIC ) 15 MG tablet TAKE 1 TABLET EVERY DAY   metoprolol  tartrate (LOPRESSOR ) 25 MG tablet  TAKE 1 TABLET TWICE DAILY   olmesartan (BENICAR) 40 MG tablet Take 1 tablet (40 mg total) by mouth daily.   pioglitazone  (ACTOS ) 45 MG tablet Take 1 tablet (45 mg total) by mouth every morning.   pregabalin  (LYRICA ) 300 MG capsule Take 1 capsule (300 mg total) by mouth 2 (two) times daily.   Semaglutide , 2 MG/DOSE, (OZEMPIC , 2 MG/DOSE,) 8 MG/3ML SOPN Inject 2 mg into the skin once a week.   TRESIBA  FLEXTOUCH 200 UNIT/ML FlexTouch Pen INJECT 96 UNITS SUBCUTANEOUSLY ONCE DAILY   TRUEplus Lancets 33G MISC TEST BLOOD SUGAR UP TO THREE TIMES DAILY   Zinc  Oxide 15 % CREA Apply to left leg wound bid   No facility-administered medications prior to visit.    Review of Systems  Constitutional:  Negative for weight loss.  HENT: Negative.    Eyes: Negative.   Respiratory:   Negative for cough, shortness of breath and wheezing.   Cardiovascular: Negative.  Negative for orthopnea.  Gastrointestinal: Negative.   Genitourinary: Negative.   Musculoskeletal:  Positive for joint pain (bilat knee pain worse on the right).  Skin: Negative.   Neurological: Negative.        Poor balance  Endo/Heme/Allergies: Negative.   Psychiatric/Behavioral: Negative.    All other systems reviewed and are negative.      Objective:   BP 110/74   Pulse 69   Ht 5' 6 (1.676 m)   Wt 233 lb 3.2 oz (105.8 kg)   SpO2 94%   BMI 37.64 kg/m   Vitals:   09/16/24 1529  BP: 110/74  Pulse: 69  Height: 5' 6 (1.676 m)  Weight: 233 lb 3.2 oz (105.8 kg)  SpO2: 94%  BMI (Calculated): 37.66    Physical Exam Vitals reviewed.  Constitutional:      General: She is not in acute distress.    Appearance: She is obese.     Comments: Contusion on rear scalp still visible  HENT:     Head: Normocephalic and atraumatic.     Nose: Nose normal.     Mouth/Throat:     Mouth: Mucous membranes are moist.  Eyes:     Extraocular Movements: Extraocular movements intact.     Pupils: Pupils are equal, round, and reactive to light.  Cardiovascular:     Rate and Rhythm: Normal rate and regular rhythm.     Heart sounds: No murmur heard. Pulmonary:     Effort: Pulmonary effort is normal.     Breath sounds: No rhonchi or rales.  Abdominal:     General: Abdomen is flat.     Palpations: There is no hepatomegaly, splenomegaly or mass.  Musculoskeletal:        General: No swelling. Normal range of motion.     Cervical back: Normal range of motion. No tenderness.     Right lower leg: Edema present.     Left lower leg: Edema present.     Left foot: Normal.     Comments: Left soft shoe  Skin:    General: Skin is warm and dry.     Findings: Wound present.     Comments: Facial brusing LLE, superficial clean base with granulating tissue  Neurological:     General: No focal deficit present.      Mental Status: She is alert and oriented to person, place, and time.     Cranial Nerves: No cranial nerve deficit.     Motor: Tremor (bilateral and fine of both hands) present. No  weakness.  Psychiatric:        Mood and Affect: Mood normal.        Behavior: Behavior normal.      Results for orders placed or performed in visit on 09/16/24  POCT CBG (Fasting - Glucose)  Result Value Ref Range   Glucose Fasting, POC 333 (A) 70 - 99 mg/dL       Latest Ref Rng & Units 09/13/2024   10:05 AM 09/12/2024    7:00 AM 06/17/2024   11:20 PM  BMP  Glucose 70 - 99 mg/dL 828  831  828   BUN 8 - 27 mg/dL 8  8  12    Creatinine 0.57 - 1.00 mg/dL 9.06  9.30  9.22   BUN/Creat Ratio 12 - 28 9     Sodium 134 - 144 mmol/L 139  140  139   Potassium 3.5 - 5.2 mmol/L 3.9  3.6  4.2   Chloride 96 - 106 mmol/L 103  109  106   CO2 20 - 29 mmol/L 24  24  24    Calcium  8.7 - 10.3 mg/dL 8.8  8.2  8.5        Assessment & Plan:  Maddisyn was seen today for results.  Venous ulcer of lower extremity due to chronic peripheral venous hypertension (HCC)  Diabetes mellitus without complication (HCC) -     POCT CBG (Fasting - Glucose) -     Lipid panel -     Hemoglobin A1c  Primary hypertension -     Comprehensive metabolic panel with GFR  Localized edema -     Comprehensive metabolic panel with GFR   Return to daily dose of lasix  and keep legs elevated when recumbent. Caregiver instructed on proper administration of Ozempic . Problem List Items Addressed This Visit       Cardiovascular and Mediastinum   Primary hypertension   Relevant Orders   Comprehensive metabolic panel with GFR     Endocrine   Diabetes mellitus without complication (HCC)   Relevant Orders   POCT CBG (Fasting - Glucose) (Completed)   Other Visit Diagnoses       Venous ulcer of lower extremity due to chronic peripheral venous hypertension (HCC)    -  Primary     Localized edema       Relevant Orders   Comprehensive metabolic  panel with GFR       Return in about 5 weeks (around 10/21/2024) for fu with labs prior.   Total time spent: 20 minutes. This time includes review of previous notes and results and patient face to face interaction during today'Denajah Farias visit.    Sherrill Cinderella Perry, MD  09/16/2024   This document may have been prepared by [employer] Voice Recognition software and as such may include unintentional dictation errors.

## 2024-09-24 ENCOUNTER — Encounter: Attending: Physician Assistant | Admitting: Physician Assistant

## 2024-09-24 DIAGNOSIS — I10 Essential (primary) hypertension: Secondary | ICD-10-CM | POA: Diagnosis not present

## 2024-09-24 DIAGNOSIS — L97822 Non-pressure chronic ulcer of other part of left lower leg with fat layer exposed: Secondary | ICD-10-CM | POA: Diagnosis not present

## 2024-09-24 DIAGNOSIS — I890 Lymphedema, not elsewhere classified: Secondary | ICD-10-CM | POA: Diagnosis not present

## 2024-09-24 DIAGNOSIS — I480 Paroxysmal atrial fibrillation: Secondary | ICD-10-CM | POA: Diagnosis not present

## 2024-09-24 DIAGNOSIS — I87332 Chronic venous hypertension (idiopathic) with ulcer and inflammation of left lower extremity: Secondary | ICD-10-CM | POA: Diagnosis not present

## 2024-09-24 DIAGNOSIS — E11628 Type 2 diabetes mellitus with other skin complications: Secondary | ICD-10-CM | POA: Diagnosis not present

## 2024-09-24 DIAGNOSIS — Z7901 Long term (current) use of anticoagulants: Secondary | ICD-10-CM | POA: Diagnosis not present

## 2024-09-25 DIAGNOSIS — I890 Lymphedema, not elsewhere classified: Secondary | ICD-10-CM | POA: Diagnosis not present

## 2024-09-27 ENCOUNTER — Encounter: Payer: Self-pay | Admitting: Oncology

## 2024-09-27 ENCOUNTER — Inpatient Hospital Stay: Attending: Oncology | Admitting: Oncology

## 2024-09-27 ENCOUNTER — Encounter: Admitting: Internal Medicine

## 2024-09-27 ENCOUNTER — Inpatient Hospital Stay

## 2024-09-27 VITALS — BP 113/59 | HR 72 | Temp 97.6°F | Resp 18 | Wt 231.5 lb

## 2024-09-27 DIAGNOSIS — R599 Enlarged lymph nodes, unspecified: Secondary | ICD-10-CM

## 2024-09-27 DIAGNOSIS — D696 Thrombocytopenia, unspecified: Secondary | ICD-10-CM | POA: Diagnosis not present

## 2024-09-27 DIAGNOSIS — R5383 Other fatigue: Secondary | ICD-10-CM | POA: Diagnosis not present

## 2024-09-27 DIAGNOSIS — Z87891 Personal history of nicotine dependence: Secondary | ICD-10-CM | POA: Diagnosis not present

## 2024-09-27 LAB — LACTATE DEHYDROGENASE: LDH: 202 U/L — ABNORMAL HIGH (ref 98–192)

## 2024-09-27 LAB — CBC WITH DIFFERENTIAL (CANCER CENTER ONLY)
Abs Immature Granulocytes: 0.01 K/uL (ref 0.00–0.07)
Basophils Absolute: 0.1 K/uL (ref 0.0–0.1)
Basophils Relative: 2 %
Eosinophils Absolute: 0.4 K/uL (ref 0.0–0.5)
Eosinophils Relative: 6 %
HCT: 37.7 % (ref 36.0–46.0)
Hemoglobin: 12.7 g/dL (ref 12.0–15.0)
Immature Granulocytes: 0 %
Lymphocytes Relative: 35 %
Lymphs Abs: 2.2 K/uL (ref 0.7–4.0)
MCH: 30.6 pg (ref 26.0–34.0)
MCHC: 33.7 g/dL (ref 30.0–36.0)
MCV: 90.8 fL (ref 80.0–100.0)
Monocytes Absolute: 1 K/uL (ref 0.1–1.0)
Monocytes Relative: 15 %
Neutro Abs: 2.7 K/uL (ref 1.7–7.7)
Neutrophils Relative %: 42 %
Platelet Count: 61 K/uL — ABNORMAL LOW (ref 150–400)
RBC: 4.15 MIL/uL (ref 3.87–5.11)
RDW: 17.2 % — ABNORMAL HIGH (ref 11.5–15.5)
WBC Count: 6.4 K/uL (ref 4.0–10.5)
nRBC: 0 % (ref 0.0–0.2)

## 2024-09-27 LAB — IMMATURE PLATELET FRACTION: Immature Platelet Fraction: 10.6 % — ABNORMAL HIGH (ref 1.2–8.6)

## 2024-09-27 LAB — PROTIME-INR
INR: 1.3 — ABNORMAL HIGH (ref 0.8–1.2)
Prothrombin Time: 16.7 s — ABNORMAL HIGH (ref 11.4–15.2)

## 2024-09-27 LAB — APTT: aPTT: 35 s (ref 24–36)

## 2024-09-27 NOTE — Progress Notes (Signed)
 Hematology/Oncology Consult note [facility] [phone] [phone]  Patient Care Team: Albina GORMAN Dine, MD as PCP - General (Internal Medicine)   Name of the patient: Yolanda Harris  [id number]  [birth year]    Reason for referral-lymphadenopathy   Referring physician-Dr. Ernest  Date of visit: 09/27/24   History of presenting illness- Patient is a 68 year old female with a past medical history significant for hypertension type 2 diabetes atrial fibrillation who presented to the ER on 09/12/2024 when she had a fall and sustained a hematoma on the right side of her forehead.  She therefore underwent a CT head cervical spine and CT maxillofacial without contrast.  CT maxillofacial contrast showed partially visible adenopathy in bilateral thoracic inlet including right level 4 or 5 lymph node 16 mm in size.  Durning for lymphoproliferative disorder such as leukemia or lymphoma.  CBC on 09/12/2024 showed white cell count of 6.9, H&H of 12.8/38.4 and a platelet count of 64.  Of note patient has had chronic thrombocytopenia with a platelet count of 1084 years ago and over the last 1 year it has been mostly between 80s to 90s.  No new aches or pains. She denies any unintentional weight loss, attributing her weight loss of nearly 40 pounds to the use of Ozempic  and discontinuation of another medication that was causing weight gain.  She uses a cane and walker for mobility around the house.       ECOG PS- 2  Pain scale- 0   Review of systems- Review of Systems  Constitutional:  Positive for malaise/fatigue. Negative for chills, fever and weight loss.  HENT:  Negative for congestion, ear discharge and nosebleeds.   Eyes:  Negative for blurred vision.  Respiratory:  Negative for cough, hemoptysis, sputum production, shortness of breath and wheezing.   Cardiovascular:  Negative for chest pain, palpitations, orthopnea and claudication.   Gastrointestinal:  Negative for abdominal pain, blood in stool, constipation, diarrhea, heartburn, melena, nausea and vomiting.  Genitourinary:  Negative for dysuria, flank pain, frequency, hematuria and urgency.  Musculoskeletal:  Negative for back pain, joint pain and myalgias.  Skin:  Negative for rash.  Neurological:  Negative for dizziness, tingling, focal weakness, seizures, weakness and headaches.  Endo/Heme/Allergies:  Does not bruise/bleed easily.  Psychiatric/Behavioral:  Negative for depression and suicidal ideas. The patient does not have insomnia.     Allergies  Allergen Reactions   Amiodarone  Shortness Of Breath   Sulfa Antibiotics Rash    Patient Active Problem List   Diagnosis Date Noted   Chronic pain syndrome 06/05/2023   Mixed hyperlipidemia 04/06/2023   Primary hypertension 04/06/2023   Diabetes mellitus without complication (HCC) 01/23/2023   Atrial fibrillation, currently in sinus rhythm 01/23/2023   SOB (shortness of breath) 01/23/2023   Abnormal nuclear stress test 01/23/2023     Past Medical History:  Diagnosis Date   Diabetes mellitus without complication (HCC)    Hypertension    Sciatic leg pain      Past Surgical History:  Procedure Laterality Date   ABDOMINAL HYSTERECTOMY     HTN      Social History   Socioeconomic History   Marital status: Married    Spouse name: Not on file   Number of children: Not on file   Years of education: Not on file   Highest education level: Not on file  Occupational History   Not on file  Tobacco Use   Smoking status: Former    Current packs/day:  0.50    Types: Cigarettes   Smokeless tobacco: Never  Vaping Use   Vaping status: Never Used  Substance and Sexual Activity   Alcohol use: Not Currently   Drug use: Never   Sexual activity: Not on file  Other Topics Concern   Not on file  Social History Narrative   Not on file   Social Drivers of Health   Financial Resource Strain: Not on file   Food Insecurity: No Food Insecurity (09/27/2024)   Hunger Vital Sign    Worried About Running Out of Food in the Last Year: Never true    Ran Out of Food in the Last Year: Never true  Transportation Needs: No Transportation Needs (09/27/2024)   PRAPARE - Administrator, Civil Service (Medical): No    Lack of Transportation (Non-Medical): No  Physical Activity: Not on file  Stress: Not on file  Social Connections: Not on file  Intimate Partner Violence: Not At Risk (09/27/2024)   Humiliation, Afraid, Rape, and Kick questionnaire    Fear of Current or Ex-Partner: No    Emotionally Abused: No    Physically Abused: No    Sexually Abused: No     Family History  Problem Relation Age of Onset   Breast cancer Neg Hx      Current Outpatient Medications:    Alcohol Swabs (DROPSAFE ALCOHOL PREP) 70 % PADS, USE TO CLEAN FINGER BEFORE STICKING TO CHECK SUGAR AS DIRECTED, Disp: 300 each, Rfl: 3   amitriptyline (ELAVIL) 25 MG tablet, TAKE 1 TABLET EVERY MORNING, Disp: 90 tablet, Rfl: 3   amLODipine -benazepril  (LOTREL) 5-20 MG capsule, Take 1 capsule by mouth daily., Disp: 90 capsule, Rfl: 0   atorvastatin  (LIPITOR) 10 MG tablet, TAKE 1 TABLET EVERY EVENING, Disp: 90 tablet, Rfl: 1   cetirizine  (ZYRTEC  ALLERGY) 10 MG tablet, Take 1 tablet (10 mg total) by mouth daily., Disp: 30 tablet, Rfl: 2   cyclobenzaprine  (FLEXERIL ) 10 MG tablet, Take 1 tablet (10 mg total) by mouth 3 (three) times daily., Disp: 270 tablet, Rfl: 0   empagliflozin (JARDIANCE) 25 MG TABS tablet, Take 25 mg by mouth daily., Disp: , Rfl:    fluticasone  (FLONASE ) 50 MCG/ACT nasal spray, Place 1 spray into both nostrils daily., Disp: 16 mL, Rfl: 2   furosemide  (LASIX ) 40 MG tablet, Take 1 tablet (40 mg total) by mouth daily., Disp: 30 tablet, Rfl: 0   furosemide  (LASIX ) 40 MG tablet, Take 1 tablet (40 mg total) by mouth 2 (two) times daily for 14 days., Disp: 28 tablet, Rfl: 0   glucose blood (RELION TRUE METRIX TEST  STRIPS) test strip, Use with device to check sugars  up to three times daily, Disp: 300 each, Rfl: 2   meloxicam  (MOBIC ) 15 MG tablet, TAKE 1 TABLET EVERY DAY, Disp: 90 tablet, Rfl: 1   metoprolol  tartrate (LOPRESSOR ) 25 MG tablet, TAKE 1 TABLET TWICE DAILY, Disp: 180 tablet, Rfl: 3   olmesartan (BENICAR) 40 MG tablet, Take 1 tablet (40 mg total) by mouth daily., Disp: 30 tablet, Rfl: 0   pioglitazone  (ACTOS ) 45 MG tablet, Take 1 tablet (45 mg total) by mouth every morning., Disp: 90 tablet, Rfl: 1   pregabalin  (LYRICA ) 300 MG capsule, Take 1 capsule (300 mg total) by mouth 2 (two) times daily., Disp: 180 capsule, Rfl: 1   rosuvastatin (CRESTOR) 20 MG tablet, Take 20 mg by mouth daily., Disp: , Rfl:    Semaglutide , 2 MG/DOSE, (OZEMPIC , 2 MG/DOSE,) 8 MG/3ML SOPN,  Inject 2 mg into the skin once a week., Disp: 3 mL, Rfl: 2   TRESIBA  FLEXTOUCH 200 UNIT/ML FlexTouch Pen, INJECT 96 UNITS SUBCUTANEOUSLY ONCE DAILY, Disp: 18 mL, Rfl: 0   TRUEplus Lancets 33G MISC, TEST BLOOD SUGAR UP TO THREE TIMES DAILY, Disp: 300 each, Rfl: 3   amiodarone  (PACERONE ) 200 MG tablet, Take 2 tablets by mouth 2 (two) times daily. (Patient not taking: Reported on 09/27/2024), Disp: , Rfl:    Zinc  Oxide 15 % CREA, Apply to left leg wound bid (Patient not taking: Reported on 09/27/2024), Disp: 99 g, Rfl: 0   Physical exam:  Vitals:   09/27/24 1334  BP: (!) 113/59  Pulse: 72  Resp: 18  Temp: 97.6 F (36.4 C)  SpO2: 98%  Weight: 231 lb 8 oz (105 kg)   Physical Exam Constitutional:      Comments: Sitting in a wheelchair.  Appears in no acute distress  Eyes:     Pupils: Pupils are equal, round, and reactive to light.  Cardiovascular:     Rate and Rhythm: Normal rate and regular rhythm.     Heart sounds: Normal heart sounds.  Pulmonary:     Effort: Pulmonary effort is normal.     Breath sounds: Normal breath sounds.  Abdominal:     General: Bowel sounds are normal.     Palpations: Abdomen is soft.     Comments: No  palpable hepatosplenomegaly  Lymphadenopathy:     Comments: No palpable cervical, supraclavicular, axillary or inguinal adenopathy    Skin:    General: Skin is warm and dry.  Neurological:     Mental Status: She is alert and oriented to person, place, and time.           Latest Ref Rng & Units 09/13/2024   10:05 AM  CMP  Glucose 70 - 99 mg/dL 828   BUN 8 - 27 mg/dL 8   Creatinine 9.42 - 8.99 mg/dL 9.06   Sodium 865 - 855 mmol/L 139   Potassium 3.5 - 5.2 mmol/L 3.9   Chloride 96 - 106 mmol/L 103   CO2 20 - 29 mmol/L 24   Calcium  8.7 - 10.3 mg/dL 8.8       Latest Ref Rng & Units 09/12/2024    7:00 AM  CBC  WBC 4.0 - 10.5 K/uL 6.9   Hemoglobin 12.0 - 15.0 g/dL 87.1   Hematocrit 63.9 - 46.0 % 38.4   Platelets 150 - 400 K/uL 64     No images are attached to the encounter.  DG Chest Portable 1 View Result Date: 09/12/2024 EXAM: 1 VIEW(S) XRAY OF THE CHEST 09/12/2024 08:42:00 AM COMPARISON: 07/09/2024 CLINICAL HISTORY: fall. Best obtainable images. Patient presents with right upper extremity pain after falling off of toilet this morning. FINDINGS: LUNGS AND PLEURA: Regressed bilateral interstitial markings. No focal pulmonary opacity. No pulmonary edema. No pleural effusion. No pneumothorax. HEART AND MEDIASTINUM: Aortic calcification. No acute abnormality of the cardiac and mediastinal silhouettes. BONES AND SOFT TISSUES: No acute osseous abnormality. IMPRESSION: 1. No acute cardiopulmonary process. Electronically signed by: Helayne Hurst MD 09/12/2024 08:49 AM EDT RP Workstation: [id number]   DG Forearm Right Result Date: 09/12/2024 EXAM: 2 VIEW(S) XRAY OF THE RIGHT FOREARM 09/12/2024 08:42:00 AM COMPARISON: None available. CLINICAL HISTORY: Patient presents with right upper extremity pain after falling off of toilet this morning. FINDINGS: BONES AND JOINTS: No acute fracture. No focal osseous lesion. No joint dislocation. SOFT TISSUES: Peripheral venous catheter in place  overlying the distal forearm. No discrete soft tissue injury. IMPRESSION: 1. No acute fracture or dislocation. Electronically signed by: Helayne Hurst MD 09/12/2024 08:47 AM EDT RP Workstation: [id number]   DG Humerus Right Result Date: 09/12/2024 EXAM: 2 VIEW(S) XRAY OF THE RIGHT HUMERUS 09/12/2024 08:42:00 AM COMPARISON: None available. CLINICAL HISTORY: Patient presents with right upper extremity pain after falling off of toilet this morning. FINDINGS: BONES AND JOINTS: No acute fracture. No focal osseous lesion. No joint dislocation. SOFT TISSUES: The soft tissues are unremarkable. IMPRESSION: 1. No acute fracture or dislocation. Electronically signed by: Helayne Hurst MD 09/12/2024 08:46 AM EDT RP Workstation: [id number]   CT Cervical Spine Wo Contrast Result Date: 09/12/2024 EXAM: CT CERVICAL SPINE WITHOUT CONTRAST 09/12/2024 07:38:22 AM TECHNIQUE: CT of the cervical spine was performed without the administration of intravenous contrast. Multiplanar reformatted images are provided for review. Automated exposure control, iterative reconstruction, and/or weight based adjustment of the mA/kV was utilized to reduce the radiation dose to as low as reasonably achievable. COMPARISON: Cervical spine MRI 06/22/2016. Face CT 09/12/2024 reported separately. CLINICAL HISTORY: 68 year old female with neck trauma after a fall, hitting her head. Denies LOC or blood thinners. Right forehead hematoma. FINDINGS: CERVICAL SPINE: BONES AND ALIGNMENT: Chronic straightening of cervical lordosis, mild reversal now. No acute fracture or traumatic malalignment. DEGENERATIVE CHANGES: Bulky chronic disc and endplate degeneration in the cervical spine C5-C6 through C7-T1. Mild chronic spinal stenosis suspected by CT. SOFT TISSUES: No prevertebral soft tissue swelling. Partially retropharyngeal course of the common carotid arteries. Mild respiratory motion in the upper chest. Redemonstrated abnormality of right level 4/5 lymph node  nearly 2 cm short axis on this study series 5 image 70. Smaller but increased in number left thoracic inlet lymph nodes. These are nonspecific. IMPRESSION: 1. No acute traumatic injury identified in the cervical spine. 2. Redemonstrated thoracic inlet increased lymph nodes, up to 2 cm short axis on the right. See Face CT today. 3. Bulky chronic disc and endplate degeneration at C5-C6 through C7-T1 with chronic spinal stenosis by CT. Electronically signed by: Helayne Hurst MD 09/12/2024 07:53 AM EDT RP Workstation: [id number]   CT Maxillofacial Wo Contrast Result Date: 09/12/2024 EXAM: CT OF THE FACE WITHOUT CONTRAST 09/12/2024 07:38:22 AM TECHNIQUE: CT of the face was performed without the administration of intravenous contrast. Multiplanar reformatted images are provided for review. Automated exposure control, iterative reconstruction, and/or weight based adjustment of the mA/kV was utilized to reduce the radiation dose to as low as reasonably achievable. COMPARISON: CT today reported separately. CLINICAL HISTORY: 68 year old female. Facial trauma, blunt. FINDINGS: FACIAL BONES: Absent and carious dentition. No acute facial fracture. No mandibular dislocation. No suspicious bone lesion. ORBITS: Globes and intraorbital soft tissues appear symmetric and normal. Superficial right periorbital soft tissue hematoma and contusion is patchy most of the preseptal space is spared. No soft tissue gas. SINUSES AND MASTOIDS: Paranasal sinuses, tympanic cavities and mastoids clear. SOFT TISSUES: Retropharyngeal course of the common carotid arteries, normal variant. Unrelated appearing 15 mm intermediate density soft tissue nodule in the superior right parotid gland (coronal image 63). 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 Otherwise negative visible noncontrast deep soft tissue spaces of the face. Partially visible lymphadenopathy at the bilateral thoracic inlet, including the  large right level 4 or level 5 lymph node which is 16 mm short axis on series 3 image 87. Thoracic inlet lymphadenopathy, nonspecific but suspicious for lymphoproliferative disorder such as leukemia or lymphoma. IMPRESSION: 1.  Superficial right periorbital soft tissue injury.  No acute facial fracture. 2. Thoracic inlet lymphadenopathy is visible, nonspecific but suspicious for lymphoproliferative disorder such as Leukemia or Lymphoma. Recommend further evaluation. 3. 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 Recommend routine follow-up with ENT. Electronically signed by: Helayne Hurst MD 09/12/2024 07:49 AM EDT RP Workstation: [id number]   CT Head Wo Contrast Result Date: 09/12/2024 EXAM: CT HEAD WITHOUT CONTRAST 09/12/2024 07:38:22 AM TECHNIQUE: CT of the head was performed without the administration of intravenous contrast. Automated exposure control, iterative reconstruction, and/or weight based adjustment of the mA/kV was utilized to reduce the radiation dose to as low as reasonably achievable. COMPARISON: Head CT 06/18/2024. Face and cervical spine CT reported separately today. CLINICAL HISTORY: 68 year old female. Head trauma, minor. Fall this morning, hit head on cabinet. Denies loss of consciousness or blood thinners. Hematoma to right side of forehead. FINDINGS: BRAIN AND VENTRICLES: No acute hemorrhage. No evidence of acute infarct. No hydrocephalus. No extra-axial collection. No mass effect or midline shift. Brain volume is stable, with minimal limits for age. No suspicious intracranial vascular hyperdensity. ORBITS: Globes and intraorbital soft tissues appear to remain normal. SINUSES: Right frontal sinus appears intact. Sinuses are clear. SOFT TISSUES AND SKULL: Right periorbital and supraorbital forehead mild scalp hematoma and contusion on series 4 image 19. Right frontal bone appears intact. Hyperostosis of the calvarium, normal variant. IMPRESSION: 1. Scalp soft tissue injury. 2. Normal for  age non contrast CT appearance of the brain. Electronically signed by: Helayne Hurst MD 09/12/2024 07:44 AM EDT RP Workstation: [id number]    Assessment and plan- Patient is a 68 y.o. female referred for concerning intrathoracic adenopathy noted incidentally on CT cervical spine     Enlarged thoracic inlet lymph nodes: These were incidentally noted on CT cervical spine which was done after she had a fall. Enlarged lymph nodes at thoracic inlet, 1.6 cm on CT. Differential includes lymphoma/ CLL/ lung cancer - Ordered PET scan for lymph node evaluation. - If PET not approved, order CT scan. - Review scan results post-Thanksgiving for further steps.  Chronic thrombocytopenia Chronic low platelet count, recent count 64, lowest recorded. - Will check LDH PT PTT INR and repeat CBC with differential along with immature platelet count today.  I will see her in about 3-1/2 weeks to discuss the results of PET scan and blood work and further management and consideration of biopsy if need be.  Clinically patient does not have any palpable adenopathy or B symptoms   Thank you for this kind referral and the opportunity to participate in the care of this patient   Visit Diagnosis 1. Adenopathy     Dr. Annah Skene, MD, MPH [facility] [phone] 09/27/2024

## 2024-09-30 ENCOUNTER — Other Ambulatory Visit (HOSPITAL_COMMUNITY): Payer: Self-pay

## 2024-10-01 ENCOUNTER — Inpatient Hospital Stay
Admission: EM | Admit: 2024-10-01 | Discharge: 2024-10-21 | DRG: 853 | Disposition: E | Attending: Pulmonary Disease | Admitting: Pulmonary Disease

## 2024-10-01 ENCOUNTER — Encounter: Admitting: Physician Assistant

## 2024-10-01 ENCOUNTER — Emergency Department

## 2024-10-01 ENCOUNTER — Encounter: Payer: Self-pay | Admitting: Internal Medicine

## 2024-10-01 DIAGNOSIS — K760 Fatty (change of) liver, not elsewhere classified: Secondary | ICD-10-CM | POA: Diagnosis not present

## 2024-10-01 DIAGNOSIS — R591 Generalized enlarged lymph nodes: Secondary | ICD-10-CM | POA: Diagnosis present

## 2024-10-01 DIAGNOSIS — M7989 Other specified soft tissue disorders: Secondary | ICD-10-CM | POA: Diagnosis not present

## 2024-10-01 DIAGNOSIS — G894 Chronic pain syndrome: Secondary | ICD-10-CM | POA: Diagnosis present

## 2024-10-01 DIAGNOSIS — E669 Obesity, unspecified: Secondary | ICD-10-CM | POA: Diagnosis not present

## 2024-10-01 DIAGNOSIS — A419 Sepsis, unspecified organism: Principal | ICD-10-CM | POA: Diagnosis present

## 2024-10-01 DIAGNOSIS — M25462 Effusion, left knee: Secondary | ICD-10-CM | POA: Diagnosis present

## 2024-10-01 DIAGNOSIS — J9601 Acute respiratory failure with hypoxia: Secondary | ICD-10-CM | POA: Diagnosis not present

## 2024-10-01 DIAGNOSIS — L97822 Non-pressure chronic ulcer of other part of left lower leg with fat layer exposed: Secondary | ICD-10-CM | POA: Diagnosis not present

## 2024-10-01 DIAGNOSIS — R579 Shock, unspecified: Secondary | ICD-10-CM | POA: Diagnosis not present

## 2024-10-01 DIAGNOSIS — D693 Immune thrombocytopenic purpura: Secondary | ICD-10-CM | POA: Diagnosis present

## 2024-10-01 DIAGNOSIS — Z6837 Body mass index (BMI) 37.0-37.9, adult: Secondary | ICD-10-CM

## 2024-10-01 DIAGNOSIS — R6521 Severe sepsis with septic shock: Secondary | ICD-10-CM | POA: Diagnosis not present

## 2024-10-01 DIAGNOSIS — J9602 Acute respiratory failure with hypercapnia: Secondary | ICD-10-CM | POA: Diagnosis not present

## 2024-10-01 DIAGNOSIS — F17210 Nicotine dependence, cigarettes, uncomplicated: Secondary | ICD-10-CM | POA: Diagnosis present

## 2024-10-01 DIAGNOSIS — I4891 Unspecified atrial fibrillation: Secondary | ICD-10-CM

## 2024-10-01 DIAGNOSIS — E119 Type 2 diabetes mellitus without complications: Secondary | ICD-10-CM | POA: Diagnosis present

## 2024-10-01 DIAGNOSIS — E871 Hypo-osmolality and hyponatremia: Secondary | ICD-10-CM | POA: Diagnosis present

## 2024-10-01 DIAGNOSIS — F32A Depression, unspecified: Secondary | ICD-10-CM | POA: Diagnosis present

## 2024-10-01 DIAGNOSIS — E785 Hyperlipidemia, unspecified: Secondary | ICD-10-CM | POA: Diagnosis not present

## 2024-10-01 DIAGNOSIS — R9431 Abnormal electrocardiogram [ECG] [EKG]: Secondary | ICD-10-CM | POA: Diagnosis not present

## 2024-10-01 DIAGNOSIS — Z7985 Long-term (current) use of injectable non-insulin antidiabetic drugs: Secondary | ICD-10-CM

## 2024-10-01 DIAGNOSIS — M1712 Unilateral primary osteoarthritis, left knee: Secondary | ICD-10-CM | POA: Diagnosis present

## 2024-10-01 DIAGNOSIS — E860 Dehydration: Secondary | ICD-10-CM | POA: Diagnosis present

## 2024-10-01 DIAGNOSIS — Z794 Long term (current) use of insulin: Secondary | ICD-10-CM

## 2024-10-01 DIAGNOSIS — J811 Chronic pulmonary edema: Secondary | ICD-10-CM | POA: Diagnosis not present

## 2024-10-01 DIAGNOSIS — R7401 Elevation of levels of liver transaminase levels: Secondary | ICD-10-CM | POA: Diagnosis not present

## 2024-10-01 DIAGNOSIS — D649 Anemia, unspecified: Secondary | ICD-10-CM | POA: Diagnosis present

## 2024-10-01 DIAGNOSIS — N179 Acute kidney failure, unspecified: Secondary | ICD-10-CM | POA: Diagnosis not present

## 2024-10-01 DIAGNOSIS — M7042 Prepatellar bursitis, left knee: Secondary | ICD-10-CM | POA: Diagnosis not present

## 2024-10-01 DIAGNOSIS — I517 Cardiomegaly: Secondary | ICD-10-CM | POA: Diagnosis not present

## 2024-10-01 DIAGNOSIS — Z7984 Long term (current) use of oral hypoglycemic drugs: Secondary | ICD-10-CM

## 2024-10-01 DIAGNOSIS — K5900 Constipation, unspecified: Secondary | ICD-10-CM | POA: Diagnosis present

## 2024-10-01 DIAGNOSIS — I480 Paroxysmal atrial fibrillation: Secondary | ICD-10-CM | POA: Diagnosis not present

## 2024-10-01 DIAGNOSIS — E8721 Acute metabolic acidosis: Secondary | ICD-10-CM | POA: Diagnosis not present

## 2024-10-01 DIAGNOSIS — Z791 Long term (current) use of non-steroidal anti-inflammatories (NSAID): Secondary | ICD-10-CM

## 2024-10-01 DIAGNOSIS — K7689 Other specified diseases of liver: Secondary | ICD-10-CM | POA: Diagnosis not present

## 2024-10-01 DIAGNOSIS — L03116 Cellulitis of left lower limb: Principal | ICD-10-CM | POA: Diagnosis present

## 2024-10-01 DIAGNOSIS — N170 Acute kidney failure with tubular necrosis: Secondary | ICD-10-CM | POA: Diagnosis present

## 2024-10-01 DIAGNOSIS — E875 Hyperkalemia: Secondary | ICD-10-CM | POA: Diagnosis present

## 2024-10-01 DIAGNOSIS — G928 Other toxic encephalopathy: Secondary | ICD-10-CM | POA: Diagnosis not present

## 2024-10-01 DIAGNOSIS — Z90710 Acquired absence of both cervix and uterus: Secondary | ICD-10-CM

## 2024-10-01 DIAGNOSIS — G929 Unspecified toxic encephalopathy: Secondary | ICD-10-CM | POA: Diagnosis not present

## 2024-10-01 DIAGNOSIS — Z713 Dietary counseling and surveillance: Secondary | ICD-10-CM

## 2024-10-01 DIAGNOSIS — R6520 Severe sepsis without septic shock: Secondary | ICD-10-CM | POA: Diagnosis not present

## 2024-10-01 DIAGNOSIS — I872 Venous insufficiency (chronic) (peripheral): Secondary | ICD-10-CM | POA: Diagnosis present

## 2024-10-01 DIAGNOSIS — E66812 Obesity, class 2: Secondary | ICD-10-CM | POA: Diagnosis present

## 2024-10-01 DIAGNOSIS — D696 Thrombocytopenia, unspecified: Secondary | ICD-10-CM | POA: Diagnosis present

## 2024-10-01 DIAGNOSIS — I890 Lymphedema, not elsewhere classified: Secondary | ICD-10-CM | POA: Diagnosis not present

## 2024-10-01 DIAGNOSIS — Z66 Do not resuscitate: Secondary | ICD-10-CM | POA: Diagnosis not present

## 2024-10-01 DIAGNOSIS — E872 Acidosis, unspecified: Secondary | ICD-10-CM | POA: Diagnosis present

## 2024-10-01 DIAGNOSIS — I10 Essential (primary) hypertension: Secondary | ICD-10-CM | POA: Diagnosis not present

## 2024-10-01 DIAGNOSIS — Z452 Encounter for adjustment and management of vascular access device: Secondary | ICD-10-CM | POA: Diagnosis not present

## 2024-10-01 DIAGNOSIS — Z888 Allergy status to other drugs, medicaments and biological substances status: Secondary | ICD-10-CM

## 2024-10-01 DIAGNOSIS — E876 Hypokalemia: Secondary | ICD-10-CM | POA: Diagnosis not present

## 2024-10-01 DIAGNOSIS — J9600 Acute respiratory failure, unspecified whether with hypoxia or hypercapnia: Secondary | ICD-10-CM | POA: Diagnosis not present

## 2024-10-01 DIAGNOSIS — Z515 Encounter for palliative care: Secondary | ICD-10-CM

## 2024-10-01 DIAGNOSIS — I2489 Other forms of acute ischemic heart disease: Secondary | ICD-10-CM | POA: Diagnosis present

## 2024-10-01 DIAGNOSIS — Z882 Allergy status to sulfonamides status: Secondary | ICD-10-CM

## 2024-10-01 DIAGNOSIS — R791 Abnormal coagulation profile: Secondary | ICD-10-CM | POA: Diagnosis not present

## 2024-10-01 DIAGNOSIS — R748 Abnormal levels of other serum enzymes: Secondary | ICD-10-CM | POA: Diagnosis not present

## 2024-10-01 HISTORY — DX: Paroxysmal atrial fibrillation: I48.0

## 2024-10-01 HISTORY — DX: Hyperlipidemia, unspecified: E78.5

## 2024-10-01 HISTORY — DX: Thrombocytopenia, unspecified: D69.6

## 2024-10-01 LAB — BASIC METABOLIC PANEL WITH GFR
Anion gap: 10 (ref 5–15)
BUN: 12 mg/dL (ref 8–23)
CO2: 21 mmol/L — ABNORMAL LOW (ref 22–32)
Calcium: 9 mg/dL (ref 8.9–10.3)
Chloride: 105 mmol/L (ref 98–111)
Creatinine, Ser: 1.21 mg/dL — ABNORMAL HIGH (ref 0.44–1.00)
GFR, Estimated: 48 mL/min — ABNORMAL LOW (ref >60)
Glucose, Bld: 243 mg/dL — ABNORMAL HIGH (ref 70–99)
Potassium: 4.2 mmol/L (ref 3.5–5.1)
Sodium: 136 mmol/L (ref 135–145)

## 2024-10-01 LAB — CBC
HCT: 41.2 % (ref 36.0–46.0)
Hemoglobin: 13.9 g/dL (ref 12.0–15.0)
MCH: 30.5 pg (ref 26.0–34.0)
MCHC: 33.7 g/dL (ref 30.0–36.0)
MCV: 90.5 fL (ref 80.0–100.0)
Platelets: 60 K/uL — ABNORMAL LOW (ref 150–400)
RBC: 4.55 MIL/uL (ref 3.87–5.11)
RDW: 17.3 % — ABNORMAL HIGH (ref 11.5–15.5)
WBC: 19.2 K/uL — ABNORMAL HIGH (ref 4.0–10.5)
nRBC: 0 % (ref 0.0–0.2)

## 2024-10-01 LAB — LACTIC ACID, PLASMA: Lactic Acid, Venous: 3.8 mmol/L (ref 0.5–1.9)

## 2024-10-01 MED ORDER — INSULIN ASPART 100 UNIT/ML IJ SOLN
0.0000 [IU] | Freq: Every day | INTRAMUSCULAR | Status: DC
  Administered 2024-10-02: 4 [IU] via SUBCUTANEOUS
  Administered 2024-10-02 – 2024-10-04 (×2): 2 [IU] via SUBCUTANEOUS
  Filled 2024-10-01: qty 2
  Filled 2024-10-01: qty 4
  Filled 2024-10-01: qty 2

## 2024-10-01 MED ORDER — VANCOMYCIN HCL 2000 MG/400ML IV SOLN
2000.0000 mg | Freq: Once | INTRAVENOUS | Status: AC
  Administered 2024-10-01: 2000 mg via INTRAVENOUS
  Filled 2024-10-01: qty 400

## 2024-10-01 MED ORDER — SODIUM CHLORIDE 0.9 % IV SOLN
2.0000 g | Freq: Once | INTRAVENOUS | Status: AC
  Administered 2024-10-01: 2 g via INTRAVENOUS
  Filled 2024-10-01: qty 20

## 2024-10-01 MED ORDER — HYDRALAZINE HCL 20 MG/ML IJ SOLN: 5.0000 mg | INTRAMUSCULAR | Status: DC | PRN

## 2024-10-01 MED ORDER — SODIUM CHLORIDE 0.9 % IV BOLUS
500.0000 mL | Freq: Once | INTRAVENOUS | Status: AC
  Administered 2024-10-01: 500 mL via INTRAVENOUS

## 2024-10-01 MED ORDER — MORPHINE SULFATE (PF) 2 MG/ML IV SOLN
2.0000 mg | INTRAVENOUS | Status: DC | PRN
  Administered 2024-10-03 – 2024-10-06 (×3): 2 mg via INTRAVENOUS
  Filled 2024-10-01 (×3): qty 1

## 2024-10-01 MED ORDER — SODIUM CHLORIDE 0.9 % IV SOLN: INTRAVENOUS | Status: DC

## 2024-10-01 MED ORDER — ACETAMINOPHEN 500 MG PO TABS
1000.0000 mg | ORAL_TABLET | Freq: Once | ORAL | Status: AC
  Administered 2024-10-01: 1000 mg via ORAL
  Filled 2024-10-01: qty 2

## 2024-10-01 MED ORDER — SODIUM CHLORIDE 0.9 % IV SOLN
2.0000 g | INTRAVENOUS | Status: DC
  Administered 2024-10-02 – 2024-10-07 (×5): 2 g via INTRAVENOUS
  Filled 2024-10-01 (×6): qty 20

## 2024-10-01 MED ORDER — ACETAMINOPHEN 325 MG PO TABS
650.0000 mg | ORAL_TABLET | Freq: Four times a day (QID) | ORAL | Status: DC | PRN
  Administered 2024-10-02 – 2024-10-05 (×5): 650 mg via ORAL
  Filled 2024-10-01 (×5): qty 2

## 2024-10-01 MED ORDER — ONDANSETRON HCL 4 MG/2ML IJ SOLN
4.0000 mg | Freq: Three times a day (TID) | INTRAMUSCULAR | Status: DC | PRN
  Administered 2024-10-06: 4 mg via INTRAVENOUS
  Filled 2024-10-01: qty 2

## 2024-10-01 MED ORDER — INSULIN ASPART 100 UNIT/ML IJ SOLN
0.0000 [IU] | Freq: Three times a day (TID) | INTRAMUSCULAR | Status: DC
  Administered 2024-10-02: 3 [IU] via SUBCUTANEOUS
  Administered 2024-10-02 – 2024-10-03 (×2): 2 [IU] via SUBCUTANEOUS
  Administered 2024-10-03: 3 [IU] via SUBCUTANEOUS
  Administered 2024-10-03 – 2024-10-04 (×2): 2 [IU] via SUBCUTANEOUS
  Administered 2024-10-04 (×2): 1 [IU] via SUBCUTANEOUS
  Administered 2024-10-05: 2 [IU] via SUBCUTANEOUS
  Administered 2024-10-05: 1 [IU] via SUBCUTANEOUS
  Administered 2024-10-06: 2 [IU] via SUBCUTANEOUS
  Administered 2024-10-06 – 2024-10-07 (×2): 3 [IU] via SUBCUTANEOUS
  Filled 2024-10-01 (×2): qty 2
  Filled 2024-10-01: qty 3
  Filled 2024-10-01: qty 2
  Filled 2024-10-01: qty 3
  Filled 2024-10-01 (×2): qty 2
  Filled 2024-10-01: qty 1
  Filled 2024-10-01: qty 3
  Filled 2024-10-01: qty 2
  Filled 2024-10-01 (×2): qty 1
  Filled 2024-10-01: qty 3

## 2024-10-01 NOTE — ED Triage Notes (Signed)
 First Nurse Note: Patient to ED from the wound center for cellulitis to the left leg. Reports worsening over the past 2 days and moving up the leg.

## 2024-10-01 NOTE — ED Provider Notes (Signed)
 [facility] Provider Note    Event Date/Time   First MD Initiated Contact with Patient 10/01/24 2103     (approximate)   History   Wound Infection (Possible DVT)  First Nurse Note: Patient to ED from the wound center for cellulitis to the left leg. Reports worsening over the past 2 days and moving up the leg.  Pt here vie [facility]. Wound care doctor thought she may have a blood clot in her leg.  [facility] lab sent on temp label   HPI Yolanda Harris is a 68 y.o. female PMH diabetes, hypertension, hyperlipidemia, A-fib not on anticoagulation presents for evaluation of rash of left leg - Patient was seen by wound care provider today who recommended evaluation in the emergency department. -Patient states she was seen by wound care about a week ago, they placed a wrapping on the leg and on reevaluation today felt the redness had spread significantly but she had notably new swelling. -Patient did have a fever to 100.4 Fahrenheit earlier this morning. -Does note pain, swelling, worsening redness of her leg.  Notes it initially started on her anterior lower shin but has since spread up to just above her knee.  Able to walk and range knee without difficulty. -Not currently on any antibiotics     Physical Exam   Triage Vital Signs: ED Triage Vitals  Encounter Vitals Group     BP 10/01/24 1702 135/62     Girls Systolic BP Percentile --      Girls Diastolic BP Percentile --      Boys Systolic BP Percentile --      Boys Diastolic BP Percentile --      Pulse Rate 10/01/24 1702 75     Resp 10/01/24 1702 18     Temp 10/01/24 1702 (!) 97.3 F (36.3 C)     Temp src --      SpO2 10/01/24 1705 98 %     Weight --      Height --      Head Circumference --      Peak Flow --      Pain Score 10/01/24 1703 8     Pain Loc --      Pain Education --      Exclude from Growth Chart --     Most recent vital signs: Vitals:   10/01/24 2300 10/01/24 2330  BP: (!) 135/54  (!) 109/44  Pulse: 87 82  Resp:    Temp:    SpO2: 98% 97%     General: Awake, no distress.  CV:  Good peripheral perfusion. RRR, RP 2+ Resp:  Normal effort. CTAB Abd:  No distention. Nontender to deep palpation throughout LLE:   Moderate swelling of left lower extremity with notable overlying erythema and warmth.  No crepitus or necrotic tissue.  Redness extends up just above the knee.  Possible mild knee effusion.  Able to range knee without any pain.   ED Results / Procedures / Treatments   Labs (all labs ordered are listed, but only abnormal results are displayed) Labs Reviewed  BASIC METABOLIC PANEL WITH GFR - Abnormal; Notable for the following components:      Result Value   CO2 21 (*)    Glucose, Bld 243 (*)    Creatinine, Ser 1.21 (*)    GFR, Estimated 48 (*)    All other components within normal limits  CBC - Abnormal; Notable for the following components:  WBC 19.2 (*)    RDW 17.3 (*)    Platelets 60 (*)    All other components within normal limits  LACTIC ACID, PLASMA - Abnormal; Notable for the following components:   Lactic Acid, Venous 3.8 (*)    All other components within normal limits  CULTURE, BLOOD (ROUTINE X 2)  CULTURE, BLOOD (ROUTINE X 2)  PROTIME-INR  C-REACTIVE PROTEIN  SEDIMENTATION RATE  LACTIC ACID, PLASMA  LACTIC ACID, PLASMA  LACTIC ACID, PLASMA  HIV ANTIBODY (ROUTINE TESTING W REFLEX)  BASIC METABOLIC PANEL WITH GFR  CBC  SEDIMENTATION RATE     EKG  pending   RADIOLOGY Radiology interpreted myself and radiology report reviewed.  No acute pathology identified.    PROCEDURES:  Critical Care performed: Yes, see critical care procedure note(s)  .Critical Care  Performed by: Clarine Ozell LABOR, MD Authorized by: Clarine Ozell LABOR, MD   Critical care provider statement:    Critical care time (minutes):  30   Critical care time was exclusive of:  Separately billable procedures and treating other patients   Critical care was  necessary to treat or prevent imminent or life-threatening deterioration of the following conditions:  Sepsis   Critical care was time spent personally by me on the following activities:  Development of treatment plan with patient or surrogate, discussions with consultants, evaluation of patient's response to treatment, examination of patient, ordering and review of laboratory studies, ordering and review of radiographic studies, ordering and performing treatments and interventions, pulse oximetry, re-evaluation of patient's condition and review of old charts   I assumed direction of critical care for this patient from another provider in my specialty: no     Care discussed with: admitting provider      MEDICATIONS ORDERED IN ED: Medications  vancomycin (VANCOREADY) IVPB 2000 mg/400 mL (2,000 mg Intravenous New Bag/Given 10/01/24 2239)  0.9 %  sodium chloride infusion (has no administration in time range)  insulin aspart (novoLOG) injection 0-5 Units (has no administration in time range)  insulin aspart (novoLOG) injection 0-9 Units (has no administration in time range)  ondansetron  (ZOFRAN ) injection 4 mg (has no administration in time range)  hydrALAZINE (APRESOLINE) injection 5 mg (has no administration in time range)  acetaminophen (TYLENOL) tablet 650 mg (has no administration in time range)  morphine  (PF) 2 MG/ML injection 2 mg (has no administration in time range)  cefTRIAXone (ROCEPHIN) 2 g in sodium chloride 0.9 % 100 mL IVPB (has no administration in time range)  cefTRIAXone (ROCEPHIN) 2 g in sodium chloride 0.9 % 100 mL IVPB (0 g Intravenous Stopped 10/01/24 2231)  sodium chloride 0.9 % bolus 500 mL (0 mLs Intravenous Stopped 10/01/24 2320)  acetaminophen (TYLENOL) tablet 1,000 mg (1,000 mg Oral Given 10/01/24 2212)     IMPRESSION / MDM / ASSESSMENT AND PLAN / ED COURSE  I reviewed the triage vital signs and the nursing notes.                               DDX/MDM/AP: Differential diagnosis includes, but is not limited to, cellulitis, concern for sepsis given reported fever earlier today and extensive distribution.  No fluctuance to suggest abscess.  Doubt underlying DVT, screening ultrasound ordered from triage.  Not clinically concern for necrotizing fasciitis at this time, subacute course, welling.  Patient, no crepitus or necrotic tissue.  Doubt osteomyelitis given short timeframe as well.  Plan: - Labs - DVT ultrasound -  Anticipate admission  Patient's presentation is most consistent with acute presentation with potential threat to life or bodily function.  The patient is on the cardiac monitor to evaluate for evidence of arrhythmia and/or significant heart rate changes.  ED course below.  CBC with leukocytosis to 19.  BMP with mild AKI.  Treating with ceftriaxone and vancomycin.  DVT ultrasound with no underlying DVT.   Lactate elevated.  Start gentle fluid resuscitation, not meeting septic shock criteria.  Admitted to hospitalist service.  Clinical Course as of 10/01/24 2351  Tue Oct 01, 2024  2128 CBC with leukocytosis to 19 [MM]  2128 BMP with mild AKI [MM]  2128 DVT ultrasound with no evidence of DVT [MM]    Clinical Course User Index [MM] Clarine Ozell LABOR, MD     FINAL CLINICAL IMPRESSION(S) / ED DIAGNOSES   Final diagnoses:  Left leg cellulitis  Sepsis with acute renal failure, due to unspecified organism, unspecified acute renal failure type, unspecified whether septic shock present (HCC)  AKI (acute kidney injury)     Rx / DC Orders   ED Discharge Orders     None        Note:  This document was prepared using Dragon voice recognition software and may include unintentional dictation errors.   Clarine Ozell LABOR, MD 10/01/24 2351

## 2024-10-01 NOTE — Consult Note (Signed)
 ED Pharmacy Antibiotic Sign Off An antibiotic consult was received from an ED provider for Vancomycin per pharmacy dosing for cellulitis. A chart review was completed to assess appropriateness.   The following one time order(s) were placed:  Vancomycin 2g IV.  Further antibiotic and/or antibiotic pharmacy consults should be ordered by the admitting provider if indicated.   Thank you for allowing pharmacy to be a part of this patient's care.   Carolyne Whitsel A Chele Cornell, [facility]  Clinical Pharmacist 10/01/24 9:47 PM

## 2024-10-01 NOTE — ED Provider Notes (Signed)
 Care of this patient assumed from prior physician at 2300 pending anticipated admission. Please see prior physician note for further details.  Briefly this is a 68 year old female presenting with cellulitis of the left leg.  Negative DVT ultrasound.  Fever at home.  Labs here with leukocytosis.  Lactate elevated at 3.8.  Ordered for Rocephin and vancomycin.  Anticipated admission, signed out to me pending discussion with hospitalist.  2330 Case discussed with Dr. Hilma.  He will evaluate for anticipated admission.   Levander Slate, MD 10/01/24 [phone]

## 2024-10-01 NOTE — ED Triage Notes (Signed)
 [facility] lab sent on temp label

## 2024-10-01 NOTE — ED Triage Notes (Signed)
 Pt here vie [facility]. Wound care doctor thought she may have a blood clot in her leg.

## 2024-10-01 NOTE — H&P (Signed)
 History and Physical    Yolanda Harris FMW:[id number] DOB: [birth year] DOA: 10/01/2024  Referring MD/NP/PA:   PCP: Albina GORMAN Dine, MD   Patient coming from:  The patient is coming from home.     Chief Complaint: left lower leg pain  HPI: Yolanda Harris is a 68 y.o. female with medical history significant of HTN, HLD, DM, depression, PAF not on anticoagulants, thrombocytopenia, sciatica, obesity, chronic pain syndrome, who presents with left lower leg pain  Per patient and her 2 daughters at the bedside, patient has left lower leg infection with a small wound for more than 1 week. Initially it started on her anterior left lower shin. Patient has been following up in wound care center. They placed a wrapping on the leg a week ago. On reevaluation today, patient was found to have worsening swelling and redness, spreading up to knee level. Pt has left leg pain, which is constant, moderate, sharp, nonradiating.  Associated with fever and chills.  She had fever 100.4 earlier this morning.  Her temperature is 98.2 in ED.  Patient does not have chest pain, cough, SOB.  No nausea, vomiting, diarrhea or abdominal pain.  No symptoms of UTI.  Patient is constipated.  Pt is currently not on any antibiotics.  Data reviewed independently and ED Course: pt was found to have WBC 19.2, AKI, lactic acid 3.8, temperature normal, blood pressure 135/62, heart rate 75, RR 18, oxygen saturation 98% on room air.  Lower extremity venous Doppler of left leg is negative for DVT.  Patient is placed in telemetry bed for observation.   EKG: Not done in ED, will get one.   Review of Systems:   General: has fevers, chills, no body weight gain, has fatigue HEENT: no blurry vision, hearing changes or sore throat Respiratory: no dyspnea, coughing, wheezing CV: no chest pain, no palpitations GI: no nausea, vomiting, abdominal pain, diarrhea, constipation GU: no dysuria, burning on urination, increased urinary  frequency, hematuria  Ext: has left leg pain Neuro: no unilateral weakness, numbness, or tingling, no vision change or hearing loss Skin: no rash. Has a small wound in left lower leg MSK: No muscle spasm, no deformity, no limitation of range of movement in spin Heme: No easy bruising.  Travel history: No recent long distant travel.   Allergy:  Allergies  Allergen Reactions   Amiodarone  Shortness Of Breath   Sulfa Antibiotics Rash    Past Medical History:  Diagnosis Date   Diabetes mellitus without complication (HCC)    HLD (hyperlipidemia)    Hypertension    PAF (paroxysmal atrial fibrillation) (HCC)    Sciatic leg pain    Thrombocytopenia     Past Surgical History:  Procedure Laterality Date   ABDOMINAL HYSTERECTOMY     HTN      Social History:  reports that she has quit smoking. Her smoking use included cigarettes. She has never used smokeless tobacco. She reports that she does not currently use alcohol. She reports that she does not use drugs.  Family History:  Family History  Problem Relation Age of Onset   Breast cancer Neg Hx      Prior to Admission medications   Medication Sig Start Date End Date Taking? Authorizing Provider  Alcohol Swabs (DROPSAFE ALCOHOL PREP) 70 % PADS USE TO CLEAN FINGER BEFORE STICKING TO CHECK SUGAR AS DIRECTED 06/03/24   Albina GORMAN Dine, MD  amiodarone  (PACERONE ) 200 MG tablet Take 2 tablets by mouth 2 (two) times daily. Patient  not taking: Reported on 09/27/2024    [provider]  amitriptyline (ELAVIL) 25 MG tablet TAKE 1 TABLET EVERY MORNING 01/09/24   Albina GORMAN Dine, MD  amLODipine -benazepril  (LOTREL) 5-20 MG capsule Take 1 capsule by mouth daily. 07/30/24   Albina GORMAN Dine, MD  atorvastatin  (LIPITOR) 10 MG tablet TAKE 1 TABLET EVERY EVENING 03/25/24   Albina GORMAN Dine, MD  cetirizine  (ZYRTEC  ALLERGY) 10 MG tablet Take 1 tablet (10 mg total) by mouth daily. 07/29/24 10/27/24  Albina GORMAN Dine, MD  cyclobenzaprine   (FLEXERIL ) 10 MG tablet Take 1 tablet (10 mg total) by mouth 3 (three) times daily. 07/30/24   Albina GORMAN Dine, MD  empagliflozin (JARDIANCE) 25 MG TABS tablet Take 25 mg by mouth daily. 09/16/24   [provider]  fluticasone  (FLONASE ) 50 MCG/ACT nasal spray Place 1 spray into both nostrils daily. 07/08/24 10/06/24  Albina GORMAN Dine, MD  furosemide  (LASIX ) 40 MG tablet Take 1 tablet (40 mg total) by mouth daily. 07/30/24 07/30/25  Albina GORMAN Dine, MD  furosemide  (LASIX ) 40 MG tablet Take 1 tablet (40 mg total) by mouth 2 (two) times daily for 14 days. 09/06/24 09/27/24  Albina GORMAN Dine, MD  glucose blood (RELION TRUE METRIX TEST STRIPS) test strip Use with device to check sugars  up to three times daily 01/05/24   Albina GORMAN Dine, MD  meloxicam  (MOBIC ) 15 MG tablet TAKE 1 TABLET EVERY DAY 03/25/24   Tejan-Sie, S Ahmed, MD  metoprolol  tartrate (LOPRESSOR ) 25 MG tablet TAKE 1 TABLET TWICE DAILY 06/03/24   Tejan-Sie, S Ahmed, MD  olmesartan (BENICAR) 40 MG tablet Take 1 tablet (40 mg total) by mouth daily. 09/06/24 10/06/24  Albina GORMAN Dine, MD  pioglitazone  (ACTOS ) 45 MG tablet Take 1 tablet (45 mg total) by mouth every morning. 07/19/24   Albina GORMAN Dine, MD  pregabalin  (LYRICA ) 300 MG capsule Take 1 capsule (300 mg total) by mouth 2 (two) times daily. 07/29/24 01/25/25  Albina GORMAN Dine, MD  rosuvastatin (CRESTOR) 20 MG tablet Take 20 mg by mouth daily.    [provider]  Semaglutide , 2 MG/DOSE, (OZEMPIC , 2 MG/DOSE,) 8 MG/3ML SOPN Inject 2 mg into the skin once a week. 07/19/24 10/11/24  Albina GORMAN Dine, MD  TRESIBA  FLEXTOUCH 200 UNIT/ML FlexTouch Pen INJECT 96 UNITS SUBCUTANEOUSLY ONCE DAILY 08/16/24   Albina GORMAN Dine, MD  TRUEplus Lancets 33G MISC TEST BLOOD SUGAR UP TO THREE TIMES DAILY 06/03/24   Albina GORMAN Dine, MD  Zinc  Oxide 15 % CREA Apply to left leg wound bid Patient not taking: Reported on 09/27/2024 02/13/23   Albina GORMAN Dine, MD    Physical  Exam: Vitals:   10/01/24 2237 10/01/24 2300 10/01/24 2330 10/02/24 0017  BP:  (!) 135/54 (!) 109/44 (!) 121/54  Pulse:  87 82 81  Resp:    17  Temp: 98.2 F (36.8 C)   98.6 F (37 C)  TempSrc: Oral   Oral  SpO2:  98% 97% 97%   General: Not in acute distress HEENT:       Eyes: PERRL, EOMI, no jaundice       ENT: No discharge from the ears and nose, no pharynx injection, no tonsillar enlargement.        Neck: No JVD, no bruit, no mass felt. Heme: No neck lymph node enlargement. Cardiac: S1/S2, RRR, No murmurs, No gallops or rubs. Respiratory: No rales, wheezing, rhonchi or rubs. GI: Soft, nondistended, nontender, no rebound pain, no organomegaly, BS present. GU:  No hematuria Ext: Has swelling, erythema, tenderness, warmth in right lower leg.  Has a small wound in the left lower leg.        Musculoskeletal: No joint deformities, No joint redness or warmth, no limitation of ROM in spin. Skin: No rashes.  Neuro: Alert, oriented X3, cranial nerves II-XII grossly intact, moves all extremities normally.  Psych: Patient is not psychotic, no suicidal or hemocidal ideation.  Labs on Admission: I have personally reviewed following labs and imaging studies  CBC: Recent Labs  Lab 09/27/24 1432 10/01/24 1706  WBC 6.4 19.2*  NEUTROABS 2.7  --   HGB 12.7 13.9  HCT 37.7 41.2  MCV 90.8 90.5  PLT 61* 60*   Basic Metabolic Panel: Recent Labs  Lab 10/01/24 1706  NA 136  K 4.2  CL 105  CO2 21*  GLUCOSE 243*  BUN 12  CREATININE 1.21*  CALCIUM  9.0   GFR: Estimated Creatinine Clearance: 53.8 mL/min (A) (by C-G formula based on SCr of 1.21 mg/dL (H)). Liver Function Tests: No results for input(s): AST, ALT, ALKPHOS, BILITOT, PROT, ALBUMIN in the last 168 hours. No results for input(s): LIPASE, AMYLASE in the last 168 hours. No results for input(s): AMMONIA in the last 168 hours. Coagulation Profile: Recent Labs  Lab 09/27/24 1432  INR 1.3*   Cardiac  Enzymes: No results for input(s): CKTOTAL, CKMB, CKMBINDEX, TROPONINI in the last 168 hours. BNP (last 3 results) No results for input(s): PROBNP in the last 8760 hours. HbA1C: No results for input(s): HGBA1C in the last 72 hours. CBG: Recent Labs  Lab 10/02/24 0022  GLUCAP 310*   Lipid Profile: No results for input(s): CHOL, HDL, LDLCALC, TRIG, CHOLHDL, LDLDIRECT in the last 72 hours. Thyroid  Function Tests: No results for input(s): TSH, T4TOTAL, FREET4, T3FREE, THYROIDAB in the last 72 hours. Anemia Panel: No results for input(s): VITAMINB12, FOLATE, FERRITIN, TIBC, IRON, RETICCTPCT in the last 72 hours. Urine analysis:    Component Value Date/Time   COLORURINE YELLOW (A) 09/12/2024 0924   APPEARANCEUR CLEAR (A) 09/12/2024 0924   LABSPEC 1.006 09/12/2024 0924   PHURINE 8.0 09/12/2024 0924   GLUCOSEU 50 (A) 09/12/2024 0924   HGBUR NEGATIVE 09/12/2024 0924   BILIRUBINUR NEGATIVE 09/12/2024 0924   BILIRUBINUR Negative 06/17/2024 1350   KETONESUR NEGATIVE 09/12/2024 0924   PROTEINUR NEGATIVE 09/12/2024 0924   UROBILINOGEN 0.2 06/17/2024 1350   NITRITE NEGATIVE 09/12/2024 0924   LEUKOCYTESUR NEGATIVE 09/12/2024 0924   Sepsis Labs: @LABRCNTIP (procalcitonin:4,lacticidven:4) )No results found for this or any previous visit (from the past 240 hours).   Radiological Exams on Admission:   Assessment/Plan Principal Problem:   Cellulitis of left lower extremity Active Problems:   HTN (hypertension)   AKI (acute kidney injury)   HLD (hyperlipidemia)   PAF (paroxysmal atrial fibrillation) (HCC)   Diabetes mellitus without complication (HCC)   Thrombocytopenia   Depression   Obesity (BMI 30-39.9)   Assessment and Plan:  Cellulitis of left lower extremity: Patient has WBC 19.2, elevated lactic acid 3.8, but currently does not meets criteria for sepsis (temperature normal 98.2, heart rate 75, RR 18).  Patient is obviously at risk  of developing sepsis.  No DVT by left lower extremity venous Doppler.  - will place in tele bed for obs - Empiric antimicrobial treatment with vancomycin and Rocephin - PRN Zofran  for nausea, and Tylenol, morphine  and Percocet for pain - Blood cultures x 2  - ESR and CRP - wound care per RN - will  trend  lactic acid levels per sepsis protocol. - IVF: 1.5 L of NS bolus in ED, followed by 75 cc/h    AKI: Baseline creatinine 0.93 recently.  Her creatinine is 1.21, BUN 12, GFR 48.  Likely due to dehydration and continuation of her Mobic , Lasix  and Benicar. - Hold Lasix , Mobic , Benicar - IV fluid as above  HTN (hypertension): -Hold Benicar due to AKI - Amlodipine  5 mg daily -Continue metoprolol  - Hold Lasix  due to elevated lactic acid level - IV hydralazine as needed  HLD (hyperlipidemia) -Crestor  PAF (paroxysmal atrial fibrillation) [facility]): Patient is not taking anticoagulants.  Heart rate 70s -Continue metoprolol   Diabetes mellitus without complication [facility]): Recent A1c 7.1, poorly controlled.  Patient is taking Jardiance and Ozempic  -SSI  Thrombocytopenia: This is chronic issue.  Platelet 68 (61 on 09/27/24).  No active bleeding. - Follow-up by CBC  Depression -Amitriptyline  Obesity (BMI 30-39.9): Patient has Obesity Class II, with body weight 105 Kg and BMI 37.37 kg/m2.  - Encourage losing weight - Exercise and healthy diet      DVT ppx: SCD only to right leg (patient has thrombocytopenia, cannot use heparin or Lovenox).  Code Status: Full code   Family Communication:    Yes, patient's 2 daughters   at bed side.   Disposition Plan:  Anticipate discharge back to previous environment  Consults called: None  Admission status and Level of care: Telemetry:    for obs    Dispo: The patient is from: Home              Anticipated d/c is to: Home              Anticipated d/c date is: 1 day              Patient currently is not medically stable to  d/c.    Severity of Illness:  The appropriate patient status for this patient is OBSERVATION. Observation status is judged to be reasonable and necessary in order to provide the required intensity of service to ensure the patient's safety. The patient's presenting symptoms, physical exam findings, and initial radiographic and laboratory data in the context of their medical condition is felt to place them at decreased risk for further clinical deterioration. Furthermore, it is anticipated that the patient will be medically stable for discharge from the hospital within 2 midnights of admission.        Date of Service 10/02/2024    Caleb Exon [facility]   If 7PM-7AM, please contact night-coverage [email or web address] 10/02/2024, 12:32 AM

## 2024-10-02 ENCOUNTER — Observation Stay

## 2024-10-02 ENCOUNTER — Other Ambulatory Visit: Payer: Self-pay

## 2024-10-02 ENCOUNTER — Ambulatory Visit: Admission: RE | Admit: 2024-10-02 | Source: Ambulatory Visit

## 2024-10-02 DIAGNOSIS — Z66 Do not resuscitate: Secondary | ICD-10-CM | POA: Diagnosis not present

## 2024-10-02 DIAGNOSIS — D693 Immune thrombocytopenic purpura: Secondary | ICD-10-CM | POA: Diagnosis present

## 2024-10-02 DIAGNOSIS — J9601 Acute respiratory failure with hypoxia: Secondary | ICD-10-CM | POA: Diagnosis not present

## 2024-10-02 DIAGNOSIS — I480 Paroxysmal atrial fibrillation: Secondary | ICD-10-CM | POA: Diagnosis not present

## 2024-10-02 DIAGNOSIS — L03116 Cellulitis of left lower limb: Secondary | ICD-10-CM | POA: Diagnosis not present

## 2024-10-02 DIAGNOSIS — I2489 Other forms of acute ischemic heart disease: Secondary | ICD-10-CM | POA: Diagnosis present

## 2024-10-02 DIAGNOSIS — N179 Acute kidney failure, unspecified: Secondary | ICD-10-CM | POA: Diagnosis present

## 2024-10-02 DIAGNOSIS — E872 Acidosis, unspecified: Secondary | ICD-10-CM | POA: Diagnosis present

## 2024-10-02 DIAGNOSIS — E860 Dehydration: Secondary | ICD-10-CM | POA: Diagnosis present

## 2024-10-02 DIAGNOSIS — D649 Anemia, unspecified: Secondary | ICD-10-CM | POA: Diagnosis present

## 2024-10-02 DIAGNOSIS — G929 Unspecified toxic encephalopathy: Secondary | ICD-10-CM | POA: Diagnosis not present

## 2024-10-02 DIAGNOSIS — J9602 Acute respiratory failure with hypercapnia: Secondary | ICD-10-CM | POA: Diagnosis not present

## 2024-10-02 DIAGNOSIS — R0989 Other specified symptoms and signs involving the circulatory and respiratory systems: Secondary | ICD-10-CM | POA: Diagnosis not present

## 2024-10-02 DIAGNOSIS — R6521 Severe sepsis with septic shock: Secondary | ICD-10-CM | POA: Diagnosis not present

## 2024-10-02 DIAGNOSIS — I10 Essential (primary) hypertension: Secondary | ICD-10-CM | POA: Diagnosis not present

## 2024-10-02 DIAGNOSIS — A419 Sepsis, unspecified organism: Secondary | ICD-10-CM | POA: Diagnosis present

## 2024-10-02 DIAGNOSIS — Z4682 Encounter for fitting and adjustment of non-vascular catheter: Secondary | ICD-10-CM | POA: Diagnosis not present

## 2024-10-02 DIAGNOSIS — Z452 Encounter for adjustment and management of vascular access device: Secondary | ICD-10-CM | POA: Diagnosis not present

## 2024-10-02 DIAGNOSIS — M7042 Prepatellar bursitis, left knee: Secondary | ICD-10-CM | POA: Diagnosis present

## 2024-10-02 DIAGNOSIS — F32A Depression, unspecified: Secondary | ICD-10-CM | POA: Diagnosis not present

## 2024-10-02 DIAGNOSIS — E66812 Obesity, class 2: Secondary | ICD-10-CM | POA: Diagnosis present

## 2024-10-02 DIAGNOSIS — Z515 Encounter for palliative care: Secondary | ICD-10-CM | POA: Diagnosis not present

## 2024-10-02 DIAGNOSIS — E782 Mixed hyperlipidemia: Secondary | ICD-10-CM | POA: Diagnosis not present

## 2024-10-02 DIAGNOSIS — M1712 Unilateral primary osteoarthritis, left knee: Secondary | ICD-10-CM | POA: Diagnosis not present

## 2024-10-02 DIAGNOSIS — G928 Other toxic encephalopathy: Secondary | ICD-10-CM | POA: Diagnosis not present

## 2024-10-02 DIAGNOSIS — M009 Pyogenic arthritis, unspecified: Secondary | ICD-10-CM | POA: Diagnosis not present

## 2024-10-02 DIAGNOSIS — Z794 Long term (current) use of insulin: Secondary | ICD-10-CM | POA: Diagnosis not present

## 2024-10-02 DIAGNOSIS — E871 Hypo-osmolality and hyponatremia: Secondary | ICD-10-CM | POA: Diagnosis present

## 2024-10-02 DIAGNOSIS — R791 Abnormal coagulation profile: Secondary | ICD-10-CM | POA: Diagnosis not present

## 2024-10-02 DIAGNOSIS — Z87891 Personal history of nicotine dependence: Secondary | ICD-10-CM | POA: Diagnosis not present

## 2024-10-02 DIAGNOSIS — E119 Type 2 diabetes mellitus without complications: Secondary | ICD-10-CM | POA: Diagnosis not present

## 2024-10-02 DIAGNOSIS — R9431 Abnormal electrocardiogram [ECG] [EKG]: Secondary | ICD-10-CM | POA: Diagnosis not present

## 2024-10-02 DIAGNOSIS — N170 Acute kidney failure with tubular necrosis: Secondary | ICD-10-CM | POA: Diagnosis present

## 2024-10-02 DIAGNOSIS — R918 Other nonspecific abnormal finding of lung field: Secondary | ICD-10-CM | POA: Diagnosis not present

## 2024-10-02 DIAGNOSIS — R579 Shock, unspecified: Secondary | ICD-10-CM | POA: Diagnosis not present

## 2024-10-02 DIAGNOSIS — R7401 Elevation of levels of liver transaminase levels: Secondary | ICD-10-CM | POA: Diagnosis not present

## 2024-10-02 LAB — SEDIMENTATION RATE: Sed Rate: 43 mm/h — ABNORMAL HIGH (ref 0–30)

## 2024-10-02 LAB — LACTIC ACID, PLASMA
Lactic Acid, Venous: 1.6 mmol/L (ref 0.5–1.9)
Lactic Acid, Venous: 1.8 mmol/L (ref 0.5–1.9)
Lactic Acid, Venous: 1.4 mmol/L (ref 0.5–1.9)

## 2024-10-02 LAB — BASIC METABOLIC PANEL WITH GFR
Anion gap: 10 (ref 5–15)
BUN: 14 mg/dL (ref 8–23)
CO2: 19 mmol/L — ABNORMAL LOW (ref 22–32)
Calcium: 7.9 mg/dL — ABNORMAL LOW (ref 8.9–10.3)
Chloride: 110 mmol/L (ref 98–111)
Creatinine, Ser: 1.2 mg/dL — ABNORMAL HIGH (ref 0.44–1.00)
GFR, Estimated: 49 mL/min — ABNORMAL LOW (ref >60)
Glucose, Bld: 206 mg/dL — ABNORMAL HIGH (ref 70–99)
Potassium: 3.2 mmol/L — ABNORMAL LOW (ref 3.5–5.1)
Sodium: 139 mmol/L (ref 135–145)

## 2024-10-02 LAB — GLUCOSE, CAPILLARY
Glucose-Capillary: 113 mg/dL — ABNORMAL HIGH (ref 70–99)
Glucose-Capillary: 175 mg/dL — ABNORMAL HIGH (ref 70–99)
Glucose-Capillary: 201 mg/dL — ABNORMAL HIGH (ref 70–99)
Glucose-Capillary: 204 mg/dL — ABNORMAL HIGH (ref 70–99)
Glucose-Capillary: 310 mg/dL — ABNORMAL HIGH (ref 70–99)

## 2024-10-02 LAB — CBC
HCT: 33.5 % — ABNORMAL LOW (ref 36.0–46.0)
Hemoglobin: 11.1 g/dL — ABNORMAL LOW (ref 12.0–15.0)
MCH: 30.6 pg (ref 26.0–34.0)
MCHC: 33.1 g/dL (ref 30.0–36.0)
MCV: 92.3 fL (ref 80.0–100.0)
Platelets: 50 K/uL — ABNORMAL LOW (ref 150–400)
RBC: 3.63 MIL/uL — ABNORMAL LOW (ref 3.87–5.11)
RDW: 17.3 % — ABNORMAL HIGH (ref 11.5–15.5)
WBC: 12.6 K/uL — ABNORMAL HIGH (ref 4.0–10.5)
nRBC: 0 % (ref 0.0–0.2)

## 2024-10-02 LAB — PROTIME-INR
INR: 1.7 — ABNORMAL HIGH (ref 0.8–1.2)
Prothrombin Time: 21.1 s — ABNORMAL HIGH (ref 11.4–15.2)

## 2024-10-02 LAB — C-REACTIVE PROTEIN: CRP: 8 mg/dL — ABNORMAL HIGH (ref <1.0)

## 2024-10-02 LAB — HIV ANTIBODY (ROUTINE TESTING W REFLEX): HIV Screen 4th Generation wRfx: NONREACTIVE

## 2024-10-02 LAB — MAGNESIUM: Magnesium: 2.3 mg/dL (ref 1.7–2.4)

## 2024-10-02 MED ORDER — PREGABALIN 75 MG PO CAPS
300.0000 mg | ORAL_CAPSULE | Freq: Two times a day (BID) | ORAL | Status: DC
  Administered 2024-10-02 – 2024-10-05 (×9): 300 mg via ORAL
  Filled 2024-10-02 (×9): qty 4

## 2024-10-02 MED ORDER — AMLODIPINE BESYLATE 5 MG PO TABS: 5.0000 mg | ORAL_TABLET | Freq: Every day | ORAL | Status: DC

## 2024-10-02 MED ORDER — POLYETHYLENE GLYCOL 3350 17 G PO PACK
17.0000 g | PACK | Freq: Every day | ORAL | Status: DC | PRN
  Administered 2024-10-02: 17 g via ORAL
  Filled 2024-10-02: qty 1

## 2024-10-02 MED ORDER — AMITRIPTYLINE HCL 25 MG PO TABS
25.0000 mg | ORAL_TABLET | Freq: Every morning | ORAL | Status: DC
  Administered 2024-10-02 – 2024-10-05 (×4): 25 mg via ORAL
  Filled 2024-10-02: qty 0.5
  Filled 2024-10-02: qty 1
  Filled 2024-10-02 (×3): qty 0.5
  Filled 2024-10-02 (×4): qty 1

## 2024-10-02 MED ORDER — SODIUM CHLORIDE 0.9 % IV BOLUS
1000.0000 mL | Freq: Once | INTRAVENOUS | Status: AC
  Administered 2024-10-02: 1000 mL via INTRAVENOUS

## 2024-10-02 MED ORDER — VANCOMYCIN HCL IN DEXTROSE 1-5 GM/200ML-% IV SOLN
1000.0000 mg | INTRAVENOUS | Status: DC
  Administered 2024-10-02 – 2024-10-03 (×2): 1000 mg via INTRAVENOUS
  Filled 2024-10-02 (×3): qty 200

## 2024-10-02 MED ORDER — CYCLOBENZAPRINE HCL 10 MG PO TABS: 10.0000 mg | ORAL_TABLET | Freq: Three times a day (TID) | ORAL | Status: DC

## 2024-10-02 MED ORDER — METOPROLOL TARTRATE 25 MG PO TABS
25.0000 mg | ORAL_TABLET | Freq: Two times a day (BID) | ORAL | Status: DC
  Administered 2024-10-02 – 2024-10-04 (×6): 25 mg via ORAL
  Filled 2024-10-02 (×6): qty 1

## 2024-10-02 MED ORDER — SENNOSIDES-DOCUSATE SODIUM 8.6-50 MG PO TABS
1.0000 | ORAL_TABLET | Freq: Two times a day (BID) | ORAL | Status: DC
  Administered 2024-10-02 – 2024-10-05 (×9): 1 via ORAL
  Filled 2024-10-02 (×9): qty 1

## 2024-10-02 MED ORDER — CYCLOBENZAPRINE HCL 10 MG PO TABS
10.0000 mg | ORAL_TABLET | Freq: Three times a day (TID) | ORAL | Status: DC | PRN
  Administered 2024-10-03 – 2024-10-06 (×2): 10 mg via ORAL
  Filled 2024-10-02 (×3): qty 1

## 2024-10-02 MED ORDER — POTASSIUM CHLORIDE CRYS ER 20 MEQ PO TBCR
40.0000 meq | EXTENDED_RELEASE_TABLET | ORAL | Status: AC
  Administered 2024-10-02 (×2): 40 meq via ORAL
  Filled 2024-10-02 (×2): qty 2

## 2024-10-02 MED ORDER — ACETAMINOPHEN 325 MG PO TABS
650.0000 mg | ORAL_TABLET | Freq: Once | ORAL | Status: AC
  Administered 2024-10-02: 650 mg via ORAL
  Filled 2024-10-02: qty 2

## 2024-10-02 MED ORDER — ROSUVASTATIN CALCIUM 10 MG PO TABS
20.0000 mg | ORAL_TABLET | Freq: Every day | ORAL | Status: DC
  Administered 2024-10-02 – 2024-10-05 (×4): 20 mg via ORAL
  Filled 2024-10-02 (×4): qty 1

## 2024-10-02 NOTE — Plan of Care (Signed)

## 2024-10-02 NOTE — Progress Notes (Signed)
 PROGRESS NOTE    Yolanda Harris  FMW:[id number] DOB: Jul 11, 1956 DOA: 10/01/2024 PCP: Yolanda GORMAN Dine, MD  Chief Complaint  Patient presents with   Wound Infection    Possible DVT    Hospital Course:  Yolanda Harris is a 68 y.o. female with medical history significant of HTN, HLD, DM, depression, PAF not on anticoagulants, thrombocytopenia, sciatica, obesity, chronic pain syndrome, who presents with left lower leg pain, has small wound for which patient follows up in wound care center.  Admitted for left lower leg cellulitis, receiving IV antibiotics.  Hospital course as below  Subjective: Patient was examined at the bedside, new to me today.  Daughter also present at the bedside Intermittent headache, also blood pressure soft Will continue IV antibiotics, IV fluid   Objective: Vitals:   10/01/24 2300 10/01/24 2330 10/02/24 0017 10/02/24 0447  BP: (!) 135/54 (!) 109/44 (!) 121/54 (!) 91/43  Pulse: 87 82 81 69  Resp:   17 18  Temp:   98.6 F (37 C) 98 F (36.7 C)  TempSrc:   Oral Oral  SpO2: 98% 97% 97% 94%    Intake/Output Summary (Last 24 hours) at 10/02/2024 9247 Last data filed at 10/02/2024 0407 Gross per 24 hour  Intake 2168.21 ml  Output --  Net 2168.21 ml   Examination: General: Not in acute distress Cardiac: S1/S2, RRR, No murmurs, No gallops or rubs Respiratory: No rales, wheezing, rhonchi or rubs GI: Soft, nondistended, nontender, no rebound pain, no organomegaly, BS present. Ext: Has swelling, erythema, tenderness, warmth in left lower leg upto knee.  Has a small wound in the left lower leg Musculoskeletal: No joint deformities, No joint redness or warmth, no limitation of ROM in spin. Skin: No rashes.  Neuro: Alert, oriented X3, cranial nerves II-XII grossly intact, moves all extremities normally.   Assessment & Plan:  Cellulitis of left lower extremity - ESR, CRP elevated. Lactic acidosis resolved, does not meet criteria for sepsis -  Leukocytosis improving - US  LLE neg for DVT - Seen by Ortho, appreciate recs - no intervention. Unlikely septic arthritis - On IV Vancomycin, Ceftriaxone - WOC consulted - PRN Zofran  for nausea, and Tylenol, morphine  and Percocet for pain - Continue IV fluids - Follow blood cultures  AKI Baseline creatinine 0.93 recently.  Her creatinine is 1.20,  Likely due to dehydration and continuation of her Mobic , Lasix  and Benicar. - Hold Lasix , Mobic , Benicar - IV fluid as above  Hypokalemia - Mag normal - Monitor and replete as needed   HTN (hypertension): - Hold Benicar, Lasix  due to AKI, Hold Amlodipine  due to soft BP - Continue metoprolol  with holding parameters - IV hydralazine as needed   HLD (hyperlipidemia) - Crestor   PAF (paroxysmal atrial fibrillation) [facility]): Patient is not taking anticoagulants.  Heart rate 70s - Continue metoprolol , not taking amiodarone    Diabetes mellitus without complication [facility]): Recent A1c 7.1, poorly controlled.  Patient is taking Jardiance and Ozempic  - SSI   Thrombocytopenia - This is chronic issue.  Platelet 50k.  No active bleeding. - Follow-up by CBC   Depression -Amitriptyline   Obesity (BMI 30-39.9): Patient has Obesity Class II, with body weight 105 Kg and BMI 37.37 kg/m2.  - Encourage losing weight - Exercise and healthy diet  Chronic right hand weakness - Follow up outpatient  -- PT/OT eval   DVT prophylaxis: SCD to right leg, cannot use heparin/Lovenox due to thrombocytopenia   Code Status: Full Code Disposition:  TBD  Consultants:  Orthopedics  Procedures:  None  Antimicrobials:  Anti-infectives (From admission, onward)    Start     Dose/Rate Route Frequency Ordered Stop   10/02/24 2230  vancomycin (VANCOCIN) IVPB 1000 mg/200 mL premix        1,000 mg 200 mL/hr over 60 Minutes Intravenous Every 24 hours 10/02/24 0144     10/02/24 2200  cefTRIAXone (ROCEPHIN) 2 g in sodium chloride 0.9 % 100 mL IVPB        2  g 200 mL/hr over 30 Minutes Intravenous Every 24 hours 10/01/24 2336     10/01/24 2200  vancomycin (VANCOREADY) IVPB 2000 mg/400 mL        2,000 mg 200 mL/hr over 120 Minutes Intravenous  Once 10/01/24 2147 10/02/24 0115   10/01/24 2145  cefTRIAXone (ROCEPHIN) 2 g in sodium chloride 0.9 % 100 mL IVPB        2 g 200 mL/hr over 30 Minutes Intravenous  Once 10/01/24 2138 10/01/24 2231       Data Reviewed: I have personally reviewed following labs and imaging studies CBC: Recent Labs  Lab 09/27/24 1432 10/01/24 1706 10/02/24 0411  WBC 6.4 19.2* 12.6*  NEUTROABS 2.7  --   --   HGB 12.7 13.9 11.1*  HCT 37.7 41.2 33.5*  MCV 90.8 90.5 92.3  PLT 61* 60* 50*   Basic Metabolic Panel: Recent Labs  Lab 10/01/24 1706 10/02/24 0411  NA 136 139  K 4.2 3.2*  CL 105 110  CO2 21* 19*  GLUCOSE 243* 206*  BUN 12 14  CREATININE 1.21* 1.20*  CALCIUM  9.0 7.9*   GFR: Estimated Creatinine Clearance: 54.2 mL/min (A) (by C-G formula based on SCr of 1.2 mg/dL (H)). Liver Function Tests: No results for input(s): AST, ALT, ALKPHOS, BILITOT, PROT, ALBUMIN in the last 168 hours. CBG: Recent Labs  Lab 10/02/24 0022  GLUCAP 310*    No results found for this or any previous visit (from the past 240 hours).   Radiology Studies: US  Venous Img Lower Unilateral Left Result Date: 10/01/2024 CLINICAL DATA:  Left lower extremity swelling. EXAM: Left LOWER EXTREMITY VENOUS DOPPLER ULTRASOUND TECHNIQUE: Gray-scale sonography with compression, as well as color and duplex ultrasound, were performed to evaluate the deep venous system(s) from the level of the common femoral vein through the popliteal and proximal calf veins. COMPARISON:  None Available. FINDINGS: VENOUS Normal compressibility of the common femoral, superficial femoral, and popliteal veins, as well as the visualized calf veins. Visualized portions of profunda femoral vein and great saphenous vein unremarkable. No filling defects to  suggest DVT on grayscale or color Doppler imaging. Doppler waveforms show normal direction of venous flow, normal respiratory plasticity and response to augmentation. Limited views of the contralateral common femoral vein are unremarkable. OTHER None. Limitations: none IMPRESSION: Negative. Electronically Signed   By: Vanetta Chou M.D.   On: 10/01/2024 20:47    Scheduled Meds:  amitriptyline  25 mg Oral q morning   amLODipine   5 mg Oral Daily   insulin aspart  0-5 Units Subcutaneous QHS   insulin aspart  0-9 Units Subcutaneous TID WC   metoprolol  tartrate  25 mg Oral BID   pregabalin   300 mg Oral BID   rosuvastatin  20 mg Oral Daily   senna-docusate  1 tablet Oral BID   Continuous Infusions:  sodium chloride 75 mL/hr at 10/02/24 0407   cefTRIAXone (ROCEPHIN)  IV     vancomycin       LOS: 0 days  MDM: Patient is high risk for one or more organ failure.  They necessitate ongoing hospitalization for continued IV therapies and subsequent lab monitoring. Total time spent interpreting labs and vitals, reviewing the medical record, coordinating care amongst consultants and care team members, directly assessing and discussing care with the patient and/or family: 55 min Laree Lock, MD [facility]  To contact the attending physician between 7A-7P please use Epic Chat. To contact the covering physician during after hours 7P-7A, please review Amion.  10/02/2024, 7:52 AM   *This document has been created with the assistance of dictation software. Please excuse typographical errors. *

## 2024-10-02 NOTE — Progress Notes (Signed)
 Pharmacy Antibiotic Note  Yolanda Harris is a 68 y.o. female admitted on 10/01/2024 with L leg cellulitis.  Pharmacy has been consulted for Vancomycin dosing.  Plan: Pt given Vancomycin 2000 mg once. Vancomycin 1000 mg IV Q 24 hrs. Goal AUC 400-550. Expected AUC: 494.8 SCr used: 1.21, Vd used: 0.5, BMI: 37.3  Follow up culture results to assess for antibiotic optimization. Monitor renal function to assess for any necessary antibiotic dosing changes. Pharmacy will continue to follow and will adjust abx dosing whenever warranted.  Temp (24hrs), Avg:98 F (36.7 C), Min:97.3 F (36.3 C), Max:98.6 F (37 C)   Recent Labs  Lab 09/27/24 1432 10/01/24 1706 10/01/24 2209  WBC 6.4 19.2*  --   CREATININE  --  1.21*  --   LATICACIDVEN  --   --  3.8*    Estimated Creatinine Clearance: 53.8 mL/min (A) (by C-G formula based on SCr of 1.21 mg/dL (H)).    Allergies  Allergen Reactions   Amiodarone  Shortness Of Breath   Sulfa Antibiotics Rash    Antimicrobials this admission: 11/11 Ceftriaxone >> 11/11 Vancomycin >>   Microbiology results: 11/11 BCx: Pending  Thank you for allowing pharmacy to be a part of this patient's care.  Rankin CANDIE Dills, PharmD, MBA 10/02/2024 1:30 AM

## 2024-10-02 NOTE — Consult Note (Signed)
 ORTHOPAEDIC CONSULTATION  REQUESTING PHYSICIAN: Jerelene Critchley, MD  Chief Complaint:   Left lower extremity cellulitis  History of Present Illness: Yolanda Harris is a 68 y.o. female PMH diabetes, hypertension, hyperlipidemia, A-fib not on anticoagulation presents for evaluation of rash of left leg.  The patient reports that the rash began at her mid shin where she has a wound she is being treated for by wound care.  The erythema spread up her leg and down around her ankle to the inferior aspect of her knee as well.  She was sent in for evaluation in the emergency room to rule out blood clot by her wound care doctor.  Patient had low-grade fevers.  Reports a chronic history of knee pain on the left side and that she has had multiple injuries to her in the past and a fall earlier this summer.  She reports she knows she has arthritis in the knee and was treated in the past with injections.  She reports her knee pain at this time is not significantly different than it has been in the past.  She reports pain and swelling over the shin and calf which is increased from normal but the pain at her knee itself is grossly unchanged.  She denies any wounds over the knee and a drainage or any prior procedures to the knee itself.  She reports crepitus in her knee with range of motion and walking which is chronic in nature.  Past Medical History:  Diagnosis Date   Diabetes mellitus without complication (HCC)    HLD (hyperlipidemia)    Hypertension    PAF (paroxysmal atrial fibrillation) (HCC)    Sciatic leg pain    Thrombocytopenia    Past Surgical History:  Procedure Laterality Date   ABDOMINAL HYSTERECTOMY     HTN     Social History   Socioeconomic History   Marital status: Married    Spouse name: Not on file   Number of children: Not on file   Years of education: Not on file   Highest education level: Not on file  Occupational  History   Not on file  Tobacco Use   Smoking status: Former    Current packs/day: 0.50    Types: Cigarettes   Smokeless tobacco: Never  Vaping Use   Vaping status: Never Used  Substance and Sexual Activity   Alcohol use: Not Currently   Drug use: Never   Sexual activity: Not on file  Other Topics Concern   Not on file  Social History Narrative   Not on file   Social Drivers of Health   Financial Resource Strain: Not on file  Food Insecurity: No Food Insecurity (10/02/2024)   Hunger Vital Sign    Worried About Running Out of Food in the Last Year: Never true    Ran Out of Food in the Last Year: Never true  Transportation Needs: No Transportation Needs (10/02/2024)   PRAPARE - Administrator, Civil Service (Medical): No    Lack of Transportation (Non-Medical): No  Physical Activity: Not on file  Stress: Not on file  Social Connections: Moderately Integrated (10/02/2024)   Social Connection and Isolation Panel    Frequency of Communication with Friends and Family: Three times a week    Frequency of Social Gatherings with Friends and Family: Once a week    Attends Religious Services: Never    Database Administrator or Organizations: Yes    Attends Banker Meetings: Never  Marital Status: Married   Family History  Problem Relation Age of Onset   Breast cancer Neg Hx    Allergies  Allergen Reactions   Amiodarone  Shortness Of Breath   Sulfa Antibiotics Rash   Prior to Admission medications   Medication Sig Start Date End Date Taking? Authorizing Provider  amitriptyline (ELAVIL) 25 MG tablet TAKE 1 TABLET EVERY MORNING 01/09/24  Yes Tejan-Sie, S Ahmed, MD  amLODipine -benazepril  (LOTREL) 5-20 MG capsule Take 1 capsule by mouth daily. 07/30/24  Yes Albina GORMAN Dine, MD  atorvastatin  (LIPITOR) 10 MG tablet TAKE 1 TABLET EVERY EVENING 03/25/24  Yes Tejan-Sie, S Ahmed, MD  cyclobenzaprine  (FLEXERIL ) 10 MG tablet Take 1 tablet (10 mg total) by mouth 3  (three) times daily. 07/30/24  Yes Albina GORMAN Dine, MD  empagliflozin (JARDIANCE) 25 MG TABS tablet Take 25 mg by mouth daily. 09/16/24  Yes [provider]  furosemide  (LASIX ) 40 MG tablet Take 1 tablet (40 mg total) by mouth daily. 07/30/24 07/30/25 Yes Tejan-Sie, GORMAN Dine, MD  meloxicam  (MOBIC ) 15 MG tablet TAKE 1 TABLET EVERY DAY 03/25/24  Yes Tejan-Sie, S Ahmed, MD  metoprolol  tartrate (LOPRESSOR ) 25 MG tablet TAKE 1 TABLET TWICE DAILY 06/03/24  Yes Tejan-Sie, S Ahmed, MD  olmesartan (BENICAR) 40 MG tablet Take 1 tablet (40 mg total) by mouth daily. 09/06/24 10/06/24 Yes Albina GORMAN Dine, MD  pregabalin  (LYRICA ) 300 MG capsule Take 1 capsule (300 mg total) by mouth 2 (two) times daily. 07/29/24 01/25/25 Yes Albina GORMAN Dine, MD  rosuvastatin (CRESTOR) 20 MG tablet Take 20 mg by mouth daily.   Yes [provider]  Semaglutide , 2 MG/DOSE, (OZEMPIC , 2 MG/DOSE,) 8 MG/3ML SOPN Inject 2 mg into the skin once a week. 07/19/24 10/11/24 Yes Albina GORMAN Dine, MD  triamcinolone cream (KENALOG) 0.1 % Apply 1 Application topically 2 (two) times daily.   Yes [provider]  Alcohol Swabs (DROPSAFE ALCOHOL PREP) 70 % PADS USE TO CLEAN FINGER BEFORE STICKING TO CHECK SUGAR AS DIRECTED 06/03/24   Albina GORMAN Dine, MD  amiodarone  (PACERONE ) 200 MG tablet Take 2 tablets by mouth 2 (two) times daily. Patient not taking: Reported on 09/27/2024    [provider]  cetirizine  (ZYRTEC  ALLERGY) 10 MG tablet Take 1 tablet (10 mg total) by mouth daily. Patient not taking: Reported on 10/02/2024 07/29/24 10/27/24  Albina GORMAN Dine, MD  fluticasone  (FLONASE ) 50 MCG/ACT nasal spray Place 1 spray into both nostrils daily. Patient not taking: Reported on 10/02/2024 07/08/24 10/06/24  Albina GORMAN Dine, MD  furosemide  (LASIX ) 40 MG tablet Take 1 tablet (40 mg total) by mouth 2 (two) times daily for 14 days. 09/06/24 09/27/24  Albina GORMAN Dine, MD  glucose blood (RELION TRUE METRIX TEST STRIPS)  test strip Use with device to check sugars  up to three times daily 01/05/24   Albina GORMAN Dine, MD  pioglitazone  (ACTOS ) 45 MG tablet Take 1 tablet (45 mg total) by mouth every morning. Patient not taking: Reported on 10/02/2024 07/19/24   Albina GORMAN Dine, MD  TRESIBA  FLEXTOUCH 200 UNIT/ML FlexTouch Pen INJECT 96 UNITS SUBCUTANEOUSLY ONCE DAILY Patient not taking: Reported on 10/02/2024 08/16/24   Albina GORMAN Dine, MD  TRUEplus Lancets 33G MISC TEST BLOOD SUGAR UP TO THREE TIMES DAILY 06/03/24   Tejan-Sie, S Ahmed, MD  Zinc  Oxide 15 % CREA Apply to left leg wound bid Patient not taking: No sig reported 02/13/23   Albina GORMAN Dine, MD   US  Venous Img Lower Unilateral Left  Result Date: 10/01/2024 CLINICAL DATA:  Left lower extremity swelling. EXAM: Left LOWER EXTREMITY VENOUS DOPPLER ULTRASOUND TECHNIQUE: Gray-scale sonography with compression, as well as color and duplex ultrasound, were performed to evaluate the deep venous system(s) from the level of the common femoral vein through the popliteal and proximal calf veins. COMPARISON:  None Available. FINDINGS: VENOUS Normal compressibility of the common femoral, superficial femoral, and popliteal veins, as well as the visualized calf veins. Visualized portions of profunda femoral vein and great saphenous vein unremarkable. No filling defects to suggest DVT on grayscale or color Doppler imaging. Doppler waveforms show normal direction of venous flow, normal respiratory plasticity and response to augmentation. Limited views of the contralateral common femoral vein are unremarkable. OTHER None. Limitations: none IMPRESSION: Negative. Electronically Signed   By: Vanetta Chou M.D.   On: 10/01/2024 20:47    Positive ROS: All other systems have been reviewed and were otherwise negative with the exception of those mentioned in the HPI and as above.  Physical Exam: General:  Alert, no acute distress Psychiatric:  Patient is competent for consent  with normal mood and affect   Cardiovascular:  No pedal edema Respiratory:  No wheezing, non-labored breathing GI:  Abdomen is soft and non-tender Skin:  No lesions in the area of chief complaint Neurologic:  Sensation intact distally Lymphatic:  No axillary or cervical lymphadenopathy  Orthopedic Exam:  Left lower extremity There is erythema extending from the anterior knee and thigh extending down becoming more darker red around the ankle where there is a wound which is well-dressed with no surrounding signs of drainage.  There is 1+ pitting edema to the calf Able to plantarflex and dorsiflex the foot and ankle neurovascular intact distally Mildly tender over the medial joint line Generally warm to the entire leg Very small effusion No pain with range of motion 0 to 65 degrees No pain with micromotion Pain with end range of motion past 65 to 75 degrees of flexion localized medially Compartments all soft Grossly stable to varus and valgus stresses No pain with simulated axial load  X-rays:  X-rays from 06/17/2024 of the left knee images and report reviewed by myself.  New x-rays ordered today and pending.  These x-rays show moderate degenerative changes with medial joint space narrowing sclerosis and osteophyte formation.  No fractures or dislocations noted.  Assessment: Left lower extremity cellulitis  Plan: I reviewed the clinical and radiographic findings with the patient.  she does have cellulitis which appears to improved compared to the photos from yesterday.  She has no pain with micromotion or significant motion of the knee up to 65 degrees of range.  She has no pain with simulated axial loading of the knee.  We discussed the risks and benefits of further workup for possible septic arthritis including aspiration of the knee.  Given her clinical exam and the fact that an aspiration would have to be performed through cellulitic skin I would hold off on any sort of aspiration.  I  have very low clinical suspicion for a septic arthritis given her exam.  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  Should her exam change significantly and there is new concern for an underlying septic arthritis please re-consult.  Case discussed with the patient, nursing, and medical team.  No plan for orthopedic surgical intervention at this time.  All questions answered and the patient  agrees with the above plan.    Arthea Sheer MD  Beeper #:  [phone]  10/02/2024 1:11 PM

## 2024-10-02 NOTE — Consult Note (Addendum)
 WOC Nurse Consult Note: patient with history of lymphedema, followed at [facility] for L lower leg wound; note 11/4 using Vashe and Silvercel dressing with a 2 layer compression; Silvercel is not on formulary at [facility], will use silver alternative  Reason for Consult: L lower leg wound  Wound type: full thickness L lateral lower leg r/t venous insufficiency and lymphedema  Pressure Injury POA: NA  Measurement: see nursing flowsheet; 11/4 at [facility] 5.5 cm x 7 cm x 0.1 cm  Wound bed: red moist  Drainage (amount, consistency, odor) see nursing flowsheet  Periwound: erythema and edema (currently being treated for cellulitis)  Dressing procedure/placement/frequency: Cleanse L lower leg wound with Vashe wound cleanser Soila [phone]) do not rinse and allow to air dry. Apply silver hydrofiber ([place] [phone] Aquacel AG) to wound bed daily, cover with ABD pad and secure with Kerlix roll gauze beginning right above toes and ending right below knees. Apply Ace bandage wrapped in same fashion as Kerlix for light compression.  Elevate leg as much as possible. SOAK DRESSING IN NORMAL SALINE IF ADHERED TO WOUND BED FOR ATRAUMATIC REMOVAL.   POC discussed with bedside nurse. WOC team will not follow. Re-consult if further needs arise.   Patient should continue outpatient management of wound and lymphedema at wound care center.   Thank you,    Powell Bar MSN, RN-BC, [employer]

## 2024-10-02 NOTE — Evaluation (Signed)
 Occupational Therapy Evaluation Patient Details Name: Yolanda Harris MRN: [id number] DOB: 09/27/1956 Today's Date: 10/02/2024   History of Present Illness   Pt is a 68 year old female admitted with Cellulitis of left lower extremity    PMH significant for HTN, HLD, DM, depression, PAF not on anticoagulants, thrombocytopenia, sciatica, obesity, chronic pain syndrome     Clinical Impressions Chart reviewed to date, pt greeted semi supin ein bed with daugther/hsuband persent. She is alert and oriented x4, agreeable to OT evaluation. PTA pt amb with RW home/short community distances. She has had at least 4 reported falls in the last 6 months per her/daughter report. She performs grooming, feeding, toileting, dressing (besides LB dressing which she requires help with) with MOD I, assist for showering. Family reports she finished HHPT about 1 month ago. Pt presents with deficits in strength, endurance, activity tolerance, balance, RUE function affecting optimal ADL/functional mobility performance. She performs bed mobility with MIN-MOD A, STS and amb approx 50' with RW with CGA. She requires MAX A for LB dressing. Intermittent vcs throughout for RW technique. She does report ongoing RUE weakness/dropping things with her R hand. This has been going on for months she reports. She also reports balance issues. She has a significant kyphosis, notified MD of weakness via secure chat. Pt is left as received, all needs met. OT will follow acutely to facilitate optimal ADL/functional mobility engagement.      If plan is discharge home, recommend the following:   A little help with walking and/or transfers;A little help with bathing/dressing/bathroom;Assist for transportation;Assistance with cooking/housework     Functional Status Assessment   Patient has had a recent decline in their functional status and demonstrates the ability to make significant improvements in function in a reasonable and  predictable amount of time.     Equipment Recommendations   None recommended by OT;Other (comment) (pt has recommended equipment)     Recommendations for Other Services         Precautions/Restrictions   Precautions Precautions: Fall Recall of Precautions/Restrictions: Intact Restrictions Weight Bearing Restrictions Per Provider Order: No     Mobility Bed Mobility Overal bed mobility: Needs Assistance Bed Mobility: Supine to Sit, Sit to Supine     Supine to sit: Min assist, HOB elevated, Used rails Sit to supine: Min assist   General bed mobility comments: pt boosts herself in bed with MIN A +1 using bed features    Transfers Overall transfer level: Needs assistance Equipment used: Rolling walker (2 wheels) Transfers: Sit to/from Stand Sit to Stand: Contact guard assist                  Balance Overall balance assessment: Needs assistance Sitting-balance support: Feet supported Sitting balance-Leahy Scale: Good     Standing balance support: Bilateral upper extremity supported, During functional activity, Reliant on assistive device for balance Standing balance-Leahy Scale: Fair                             ADL either performed or assessed with clinical judgement   ADL Overall ADL's : Needs assistance/impaired Eating/Feeding: Set up                   Lower Body Dressing: Maximal assistance;Sit to/from stand Lower Body Dressing Details (indicate cue type and reason): doff/donn underwear/brief Toilet Transfer: Contact guard assist;Rolling walker (2 wheels) Toilet Transfer Details (indicate cue type and reason): simulated, intermittent vcs for technique  with RW Toileting- Clothing Manipulation and Hygiene: Maximal assistance;Sit to/from stand       Functional mobility during ADLs: Contact guard assist;Rolling walker (2 wheels) (approx 50' with RW, intermittent vcs for technique with RW)       Vision Patient Visual Report: No  change from baseline       Perception         Praxis         Pertinent Vitals/Pain Pain Assessment Pain Assessment: 0-10 Pain Score: 5  Pain Location: LLE Pain Descriptors / Indicators: Discomfort Pain Intervention(s): Monitored during session, Repositioned     Extremity/Trunk Assessment Upper Extremity Assessment Upper Extremity Assessment: Generalized weakness;Right hand dominant;RUE deficits/detail;LUE deficits/detail RUE Deficits / Details: mildly weak grip strength; AROM grossly WFL; MMT grossly 4/5 throughout RUE Coordination: decreased fine motor LUE Deficits / Details: grossly 4+/5 throughout   Lower Extremity Assessment Lower Extremity Assessment: Generalized weakness   Cervical / Trunk Assessment Cervical / Trunk Assessment: Kyphotic   Communication Communication Communication: No apparent difficulties   Cognition Arousal: Alert Behavior During Therapy: WFL for tasks assessed/performed Cognition: No apparent impairments                               Following commands: Impaired Following commands impaired: Follows one step commands with increased time     Cueing  General Comments   Cueing Techniques: Verbal cues  vss throughout, edema in LLE, unchanged pre/post session   Exercises Other Exercises Other Exercises: edu pt/family re role of OT, role of rehab, discharge recommendations   Shoulder Instructions      Home Living Family/patient expects to be discharged to:: Private residence Living Arrangements: Spouse/significant other;Children (grand children 1 great grand child) Available Help at Discharge: Family;Available 24 hours/day Type of Home: House Home Access: Stairs to enter [employer] of Steps: 2 Entrance Stairs-Rails: Left Home Layout: One level     Bathroom Shower/Tub: Estate Manager/land Agent Accessibility: Yes   Home Equipment: Agricultural Consultant (2 wheels);Grab bars - tub/shower;Shower seat - built  in;BSC/3in1;Cane - single point          Prior Functioning/Environment Prior Level of Function : Needs assist             Mobility Comments: amb with RW home/short community distances, reports at least 4 falls in the last 6 months ADLs Comments: MOD I for grooming, feeding, dressing , toileting (wears briefs); assist with showering; does not drive    OT Problem List: Decreased strength;Decreased activity tolerance;Impaired balance (sitting and/or standing);Decreased coordination;Decreased knowledge of use of DME or AE   OT Treatment/Interventions: Self-care/ADL training;Therapeutic exercise;DME and/or AE instruction;Patient/family education;Therapeutic activities;Balance training      OT Goals(Current goals can be found in the care plan section)   Acute Rehab OT Goals Patient Stated Goal: go home OT Goal Formulation: With patient Time For Goal Achievement: 10/16/24 Potential to Achieve Goals: Good ADL Goals Pt Will Perform Grooming: with modified independence;sitting;standing Pt Will Perform Lower Body Dressing: with modified independence;sitting/lateral leans;sit to/from stand Pt Will Transfer to Toilet: with modified independence;ambulating Pt Will Perform Toileting - Clothing Manipulation and hygiene: with modified independence;sitting/lateral leans;sit to/from stand Pt/caregiver will Perform Home Exercise Program: Increased strength;Right Upper extremity;With written HEP provided   OT Frequency:  Min 2X/week    Co-evaluation              AM-PAC OT 6 Clicks Daily Activity  Outcome Measure Help from another person eating meals?: None Help from another person taking care of personal grooming?: None Help from another person toileting, which includes using toliet, bedpan, or urinal?: A Little Help from another person bathing (including washing, rinsing, drying)?: A Little Help from another person to put on and taking off regular upper body clothing?:  None Help from another person to put on and taking off regular lower body clothing?: A Lot 6 Click Score: 20   End of Session Equipment Utilized During Treatment: Rolling walker (2 wheels) Nurse Communication: Mobility status  Activity Tolerance: Patient tolerated treatment well Patient left: in bed;with call bell/phone within reach;with bed alarm set  OT Visit Diagnosis: Other abnormalities of gait and mobility (R26.89);Muscle weakness (generalized) (M62.81)                Time: 8581-8557 OT Time Calculation (min): 24 min Charges:  OT General Charges $OT Visit: 1 Visit OT Evaluation $OT Eval Moderate Complexity: 1 Mod  Therisa Sheffield, OTD OTR/L  10/02/24, 4:01 PM

## 2024-10-03 DIAGNOSIS — L03116 Cellulitis of left lower limb: Secondary | ICD-10-CM | POA: Diagnosis not present

## 2024-10-03 LAB — CBC
HCT: 32.9 % — ABNORMAL LOW (ref 36.0–46.0)
Hemoglobin: 11.3 g/dL — ABNORMAL LOW (ref 12.0–15.0)
MCH: 30.6 pg (ref 26.0–34.0)
MCHC: 34.3 g/dL (ref 30.0–36.0)
MCV: 89.2 fL (ref 80.0–100.0)
Platelets: 58 K/uL — ABNORMAL LOW (ref 150–400)
RBC: 3.69 MIL/uL — ABNORMAL LOW (ref 3.87–5.11)
RDW: 17.2 % — ABNORMAL HIGH (ref 11.5–15.5)
WBC: 11.7 K/uL — ABNORMAL HIGH (ref 4.0–10.5)
nRBC: 0 % (ref 0.0–0.2)

## 2024-10-03 LAB — BASIC METABOLIC PANEL WITH GFR
Anion gap: 8 (ref 5–15)
BUN: 14 mg/dL (ref 8–23)
CO2: 18 mmol/L — ABNORMAL LOW (ref 22–32)
Calcium: 8 mg/dL — ABNORMAL LOW (ref 8.9–10.3)
Chloride: 110 mmol/L (ref 98–111)
Creatinine, Ser: 1.15 mg/dL — ABNORMAL HIGH (ref 0.44–1.00)
GFR, Estimated: 51 mL/min — ABNORMAL LOW (ref >60)
Glucose, Bld: 145 mg/dL — ABNORMAL HIGH (ref 70–99)
Potassium: 4.2 mmol/L (ref 3.5–5.1)
Sodium: 136 mmol/L (ref 135–145)

## 2024-10-03 LAB — GLUCOSE, CAPILLARY
Glucose-Capillary: 189 mg/dL — ABNORMAL HIGH (ref 70–99)
Glucose-Capillary: 226 mg/dL — ABNORMAL HIGH (ref 70–99)
Glucose-Capillary: 196 mg/dL — ABNORMAL HIGH (ref 70–99)
Glucose-Capillary: 93 mg/dL (ref 70–99)

## 2024-10-03 LAB — MAGNESIUM: Magnesium: 2.2 mg/dL (ref 1.7–2.4)

## 2024-10-03 NOTE — Progress Notes (Addendum)
 Dressing changed completed. Pt tolerated well. Notify incoming shift.

## 2024-10-03 NOTE — Progress Notes (Signed)
 Occupational Therapy Treatment Patient Details Name: Yolanda Harris MRN: [id number] DOB: 12-20-1955 Today's Date: 10/03/2024   History of present illness Pt is a 68 year old female admitted with Cellulitis of left lower extremity    PMH significant for HTN, HLD, DM, depression, PAF not on anticoagulants, thrombocytopenia, sciatica, obesity, chronic pain syndrome. MD dx includes: Cellulitis L LE, AKI, HTN, HLD, PAF, DM, Thrombocytopenia, Depression, Obesity.   OT comments  Pt is supine in bed on arrival. Asleep on entry, but aroused with increased time and agreeable to OT session. She reports mild burning pain to LLE, otherwise no complaints. Pt performed bed mobility with Min A for trunkal elevation. She needed CGA for STS from EOB and toilet during session with cues for hand placement and use of grab bar. CGA/SBA for seated peri-care after toileting and standing hand hygiene and oral care at sink with unilateral support. Pt with increased ambulation distance of ~220 feet this session using RW with CGA and notable fatigue once returned to room. Pt left seated EOB with all VSS. Husband present and nurse aware. All needs in place and she will cont to require skilled acute OT services to maximize her safety and IND to return to PLOF.       If plan is discharge home, recommend the following:  A little help with walking and/or transfers;A little help with bathing/dressing/bathroom;Assist for transportation;Assistance with cooking/housework   Equipment Recommendations  None recommended by OT;Other (comment)    Recommendations for Other Services      Precautions / Restrictions Precautions Precautions: Fall Recall of Precautions/Restrictions: Intact Restrictions Weight Bearing Restrictions Per Provider Order: No       Mobility Bed Mobility Overal bed mobility: Needs Assistance Bed Mobility: Supine to Sit     Supine to sit: Min assist     General bed mobility comments: for trunkal  elevation to reach EOB , increased time    Transfers Overall transfer level: Needs assistance Equipment used: Rolling walker (2 wheels) Transfers: Sit to/from Stand Sit to Stand: Contact guard assist           General transfer comment: cues for hand placement to stand from EOB and toilet; ambulated ~220 ft using RW with CGA     Balance Overall balance assessment: Needs assistance Sitting-balance support: Feet supported Sitting balance-Leahy Scale: Good     Standing balance support: Single extremity supported, During functional activity Standing balance-Leahy Scale: Good Standing balance comment: standing oral care at sink and during LB dressing management after toileting                           ADL either performed or assessed with clinical judgement   ADL Overall ADL's : Needs assistance/impaired     Grooming: Oral care;Standing;Supervision/safety Grooming Details (indicate cue type and reason): at sink counter                 Toilet Transfer: Contact guard assist;Rolling walker (2 wheels);Regular Toilet;Grab bars Toilet Transfer Details (indicate cue type and reason): cues for grab bar use Toileting- Clothing Manipulation and Hygiene: Maximal assistance;Supervision/safety;Sitting/lateral lean Toileting - Clothing Manipulation Details (indicate cue type and reason): after cont urine on toilet     Functional mobility during ADLs: Contact guard assist;Rolling walker (2 wheels)      Extremity/Trunk Assessment              Vision       Perception     Praxis  Communication Communication Communication: No apparent difficulties   Cognition Arousal: Alert Behavior During Therapy: WFL for tasks assessed/performed                                 Following commands: Impaired Following commands impaired: Follows one step commands with increased time      Cueing   Cueing Techniques: Verbal cues, Tactile cues, Gestural cues   Exercises      Shoulder Instructions       General Comments VSS throughout session, no c/o lightheadedness, fatigue by end of session although pt with increased ambulation distances this session    Pertinent Vitals/ Pain       Pain Assessment Pain Assessment: Faces Faces Pain Scale: Hurts a little bit Pain Location: LLE Pain Descriptors / Indicators: Burning Pain Intervention(s): Monitored during session, Repositioned, Limited activity within patient's tolerance  Home Living                                          Prior Functioning/Environment              Frequency  Min 2X/week        Progress Toward Goals  OT Goals(current goals can now be found in the care plan section)  Progress towards OT goals: Progressing toward goals  Acute Rehab OT Goals Patient Stated Goal: go home OT Goal Formulation: With patient Time For Goal Achievement: 10/16/24 Potential to Achieve Goals: Good  Plan      Co-evaluation                 AM-PAC OT 6 Clicks Daily Activity     Outcome Measure   Help from another person eating meals?: None Help from another person taking care of personal grooming?: None Help from another person toileting, which includes using toliet, bedpan, or urinal?: A Little Help from another person bathing (including washing, rinsing, drying)?: A Little Help from another person to put on and taking off regular upper body clothing?: None Help from another person to put on and taking off regular lower body clothing?: A Little 6 Click Score: 21    End of Session Equipment Utilized During Treatment: Rolling walker (2 wheels);Gait belt  OT Visit Diagnosis: Other abnormalities of gait and mobility (R26.89);Muscle weakness (generalized) (M62.81)   Activity Tolerance Patient tolerated treatment well   Patient Left in bed;with call bell/phone within reach;with family/visitor present   Nurse Communication Mobility status         Time: 8385-8347 OT Time Calculation (min): 38 min  Charges: OT General Charges $OT Visit: 1 Visit OT Treatments $Self Care/Home Management : 23-37 mins $Therapeutic Activity: 8-22 mins  Shaquill Iseman, OTR/L  10/03/24, 5:19 PM   Sander Speckman E Christiano Blandon 10/03/2024, 5:16 PM

## 2024-10-03 NOTE — Plan of Care (Signed)
  Problem: Education: Goal: Knowledge of General Education information will improve Description: Including pain rating scale, medication(s)/side effects and non-pharmacologic comfort measures Outcome: Progressing   Problem: Clinical Measurements: Goal: Will remain free from infection Outcome: Progressing   Problem: Clinical Measurements: Goal: Respiratory complications will improve Outcome: Progressing   Problem: Activity: Goal: Risk for activity intolerance will decrease Outcome: Progressing   Problem: Nutrition: Goal: Adequate nutrition will be maintained Outcome: Progressing   Problem: Pain Managment: Goal: General experience of comfort will improve and/or be controlled Outcome: Progressing   Problem: Safety: Goal: Ability to remain free from injury will improve Outcome: Progressing

## 2024-10-03 NOTE — Plan of Care (Signed)
  Problem: Education: Goal: Ability to describe self-care measures that may prevent or decrease complications (Diabetes Survival Skills Education) will improve Outcome: Progressing   Problem: Skin Integrity: Goal: Risk for impaired skin integrity will decrease Outcome: Progressing   Problem: Pain Managment: Goal: General experience of comfort will improve and/or be controlled Outcome: Progressing   Problem: Safety: Goal: Ability to remain free from injury will improve Outcome: Progressing   Problem: Skin Integrity: Goal: Risk for impaired skin integrity will decrease Outcome: Progressing   Problem: Education: Goal: Ability to describe self-care measures that may prevent or decrease complications (Diabetes Survival Skills Education) will improve Outcome: Progressing   Problem: Skin Integrity: Goal: Risk for impaired skin integrity will decrease Outcome: Progressing   Problem: Pain Managment: Goal: General experience of comfort will improve and/or be controlled Outcome: Progressing   Problem: Safety: Goal: Ability to remain free from injury will improve Outcome: Progressing   Problem: Skin Integrity: Goal: Risk for impaired skin integrity will decrease Outcome: Progressing

## 2024-10-03 NOTE — Progress Notes (Signed)
 PROGRESS NOTE    Yolanda Harris  FMW:[id number] DOB: 09/28/1956 DOA: 10/01/2024 PCP: Albina GORMAN Dine, MD  Chief Complaint  Patient presents with   Wound Infection    Possible DVT    Hospital Course:  Yolanda Harris is a 68 y.o. female with medical history significant of HTN, HLD, DM, depression, PAF not on anticoagulants, thrombocytopenia, sciatica, obesity, chronic pain syndrome, who presents with left lower leg pain, has small wound for which patient follows up in wound care center.  Admitted for left lower leg cellulitis, receiving IV antibiotics.  Hospital course as below  Subjective: Patient was examined at the bedside,  Daughter also present at the bedside Headache has resolved, denies visual changes, no neck stiffness Will continue IV antibiotics, Left knee swelling and erythema slowly improving   Objective: Vitals:   10/03/24 0115 10/03/24 0558 10/03/24 0749 10/03/24 1427  BP:  (!) 100/52 (!) 89/52 (!) 96/39  Pulse:  81 81 78  Resp:  18 16 17   Temp: 98.8 F (37.1 C) 98.8 F (37.1 C) 98.6 F (37 C) 98.6 F (37 C)  TempSrc: Oral Oral    SpO2:  95% 95% 96%    Intake/Output Summary (Last 24 hours) at 10/03/2024 1541 Last data filed at 10/03/2024 1430 Gross per 24 hour  Intake 1257.72 ml  Output --  Net 1257.72 ml   Examination: General: Not in acute distress Cardiac: S1/S2, RRR, No murmurs, No gallops or rubs Respiratory: No rales, wheezing, rhonchi or rubs GI: Soft, nondistended, nontender, no rebound pain, no organomegaly, BS present. Ext: Has swelling, erythema, tenderness, warmth in left lower leg upto knee.  Has a small wound in the left lower leg Musculoskeletal: No joint deformities, No joint redness or warmth, no limitation of ROM in spin. Skin: No rashes.  Neuro: Alert, oriented X3, cranial nerves II-XII grossly intact, moves all extremities normally.   Assessment & Plan:  Cellulitis of left lower extremity - ESR, CRP elevated. Lactic  acidosis resolved, does not meet criteria for sepsis - Leukocytosis improving - US  LLE neg for DVT - Bcx NGTD - Seen by Ortho, appreciate recs - no intervention. Unlikely septic arthritis - On IV Vancomycin, Ceftriaxone - Seen by WOC consulted - PRN Zofran  for nausea, and Tylenol, morphine  and Percocet for pain - discontinue IV fluids  AKI Baseline creatinine 0.93 recently.  Her creatinine is 1.20 -> 1.15,  Likely due to dehydration and continuation of her Mobic , Lasix  and Benicar. - Hold Lasix , Mobic , Benicar - s/p IV fluids, Monitor Cr  Hypokalemia - resolved - Mag normal - Monitor and replete as needed   HTN (hypertension): - Hold Benicar, Lasix  due to AKI, Hold Amlodipine  due to soft BP - Continue metoprolol  with holding parameters - IV hydralazine as needed   HLD (hyperlipidemia) - Crestor   PAF (paroxysmal atrial fibrillation) [facility]): Patient is not taking anticoagulants.  Heart rate 70s - Continue metoprolol , not taking amiodarone    Diabetes mellitus without complication [facility]): Recent A1c 7.1, poorly controlled.  Patient is taking Jardiance and Ozempic  - SSI   Thrombocytopenia - This is chronic issue.  Platelet 50k.  No active bleeding. - Follow-up by CBC   Depression -Amitriptyline   Obesity (BMI 30-39.9): Patient has Obesity Class II, with body weight 105 Kg and BMI 37.37 kg/m2.  - Encourage losing weight - Exercise and healthy diet  Chronic right hand weakness - Follow up outpatient  -- PT/OT rec Home PT/OT  DVT prophylaxis: SCD to right leg, cannot  use heparin/Lovenox due to thrombocytopenia   Code Status: Full Code Disposition:  Home with home services  Consultants:  Treatment Team:  Consulting Physician: Lorelle Hussar, MDOrthopedics  Procedures:  None  Antimicrobials:  Anti-infectives (From admission, onward)    Start     Dose/Rate Route Frequency Ordered Stop   10/02/24 2230  vancomycin (VANCOCIN) IVPB 1000 mg/200 mL premix        1,000  mg 200 mL/hr over 60 Minutes Intravenous Every 24 hours 10/02/24 0144     10/02/24 2200  cefTRIAXone (ROCEPHIN) 2 g in sodium chloride 0.9 % 100 mL IVPB        2 g 200 mL/hr over 30 Minutes Intravenous Every 24 hours 10/01/24 2336     10/01/24 2200  vancomycin (VANCOREADY) IVPB 2000 mg/400 mL        2,000 mg 200 mL/hr over 120 Minutes Intravenous  Once 10/01/24 2147 10/02/24 0115   10/01/24 2145  cefTRIAXone (ROCEPHIN) 2 g in sodium chloride 0.9 % 100 mL IVPB        2 g 200 mL/hr over 30 Minutes Intravenous  Once 10/01/24 2138 10/01/24 2231       Data Reviewed: I have personally reviewed following labs and imaging studies CBC: Recent Labs  Lab 09/27/24 1432 10/01/24 1706 10/02/24 0411 10/03/24 0605  WBC 6.4 19.2* 12.6* 11.7*  NEUTROABS 2.7  --   --   --   HGB 12.7 13.9 11.1* 11.3*  HCT 37.7 41.2 33.5* 32.9*  MCV 90.8 90.5 92.3 89.2  PLT 61* 60* 50* 58*   Basic Metabolic Panel: Recent Labs  Lab 10/01/24 1706 10/02/24 0411 10/03/24 0605  NA 136 139 136  K 4.2 3.2* 4.2  CL 105 110 110  CO2 21* 19* 18*  GLUCOSE 243* 206* 145*  BUN 12 14 14   CREATININE 1.21* 1.20* 1.15*  CALCIUM  9.0 7.9* 8.0*  MG  --  2.3 2.2   GFR: Estimated Creatinine Clearance: 56.6 mL/min (A) (by C-G formula based on SCr of 1.15 mg/dL (H)). Liver Function Tests: No results for input(s): AST, ALT, ALKPHOS, BILITOT, PROT, ALBUMIN in the last 168 hours. CBG: Recent Labs  Lab 10/02/24 1134 10/02/24 1738 10/02/24 2056 10/03/24 0815 10/03/24 1135  GLUCAP 201* 175* 204* 189* 226*    Recent Results (from the past 240 hours)  Blood Culture (routine x 2)     Status: None (Preliminary result)   Collection Time: 10/01/24  9:50 PM   Specimen: BLOOD  Result Value Ref Range Status   Specimen Description BLOOD LEFT ANTECUBITAL  Final   Special Requests   Final    BOTTLES DRAWN AEROBIC AND ANAEROBIC Blood Culture results may not be optimal due to an inadequate volume of blood received in  culture bottles   Culture   Final    NO GROWTH 2 DAYS Performed at [facility], [address] [place], [place] [address]    Report Status PENDING  Incomplete  Blood Culture (routine x 2)     Status: None (Preliminary result)   Collection Time: 10/01/24 10:05 PM   Specimen: BLOOD  Result Value Ref Range Status   Specimen Description BLOOD BLOOD LEFT FOREARM  Final   Special Requests   Final    BOTTLES DRAWN AEROBIC AND ANAEROBIC Blood Culture results may not be optimal due to an inadequate volume of blood received in culture bottles   Culture   Final    NO GROWTH 2 DAYS Performed at [facility] [address], [place], [place] [address]    Report Status PENDING  Incomplete     Radiology Studies: DG Knee Left Port Result Date: 10/02/2024 EXAM: 1 VIEW(S) XRAY OF THE LEFT KNEE 10/02/2024 01:24:00 PM COMPARISON: 06/17/2024. CLINICAL HISTORY: Cellulitis of left leg. FINDINGS: BONES AND JOINTS: No acute fracture or dislocation. No significant joint effusion. Similar medial femorotibial compartment predominant moderate joint space narrowing and osteophytosis. SOFT TISSUES: Prepatellar soft tissue swelling and prominence. IMPRESSION: 1. No acute osseous abnormality. 2. Moderate medial femorotibial compartment osteoarthritis. 3. Prepatellar soft tissue swelling and prominence, as can be seen with prepatellar bursitis. Electronically signed by: Harrietta Sherry MD 10/02/2024 01:58 PM EST RP Workstation: [id number]   US  Venous Img Lower Unilateral Left Result Date: 10/01/2024 CLINICAL DATA:  Left lower extremity swelling. EXAM: Left LOWER EXTREMITY VENOUS DOPPLER ULTRASOUND TECHNIQUE: Gray-scale sonography with compression, as well as color and duplex ultrasound, were performed to evaluate the deep venous system(s) from the level of the common femoral vein through the popliteal and proximal calf veins. COMPARISON:  None Available. FINDINGS: VENOUS Normal compressibility of the  common femoral, superficial femoral, and popliteal veins, as well as the visualized calf veins. Visualized portions of profunda femoral vein and great saphenous vein unremarkable. No filling defects to suggest DVT on grayscale or color Doppler imaging. Doppler waveforms show normal direction of venous flow, normal respiratory plasticity and response to augmentation. Limited views of the contralateral common femoral vein are unremarkable. OTHER None. Limitations: none IMPRESSION: Negative. Electronically Signed   By: Vanetta Chou M.D.   On: 10/01/2024 20:47    Scheduled Meds:  amitriptyline  25 mg Oral q morning   insulin aspart  0-5 Units Subcutaneous QHS   insulin aspart  0-9 Units Subcutaneous TID WC   metoprolol  tartrate  25 mg Oral BID   pregabalin   300 mg Oral BID   rosuvastatin  20 mg Oral Daily   senna-docusate  1 tablet Oral BID   Continuous Infusions:  cefTRIAXone (ROCEPHIN)  IV Stopped (10/02/24 2205)   vancomycin Stopped (10/03/24 0047)     LOS: 1 day  MDM: Patient is high risk for one or more organ failure.  They necessitate ongoing hospitalization for continued IV therapies and subsequent lab monitoring. Total time spent interpreting labs and vitals, reviewing the medical record, coordinating care amongst consultants and care team members, directly assessing and discussing care with the patient and/or family: 55 min Laree Lock, MD [facility]  To contact the attending physician between 7A-7P please use Epic Chat. To contact the covering physician during after hours 7P-7A, please review Amion.  10/03/2024, 3:41 PM   *This document has been created with the assistance of dictation software. Please excuse typographical errors. *

## 2024-10-03 NOTE — Evaluation (Signed)
 Physical Therapy Evaluation Patient Details Name: Yolanda Harris MRN: [id number] DOB: 20-Jul-1956 Today's Date: 10/03/2024  History of Present Illness  Pt is a 68 year old female admitted with Cellulitis of left lower extremity    PMH significant for HTN, HLD, DM, depression, PAF not on anticoagulants, thrombocytopenia, sciatica, obesity, chronic pain syndrome. MD dx includes: Cellulitis L LE, AKI, HTN, HLD, PAF, DM, Thrombocytopenia, Depression, Obesity.   Clinical Impression  Pt was sitting in the chair on arrival with daughter present, pleasant and motivated to participate during the session and put forth good effort throughout. Pt's SpO2 was WNL throughout, however pt reported having a HA at that start of the session (nursing staff notified), and symptoms did not progress with activity. Pt was able to sit to stand from chair with CGA and additional cueing for UE support through the arms of chair and hands on RW upon standing. Pt tolerated standing and marching in place without LOB and remained asymptomatic. Pt ambulated around the room with CGA, with emphasis/edu on proper placement of RW as she advances into the frame of walker with step to pattern. Pt needed cueing to lift head as she walks forward as she has significant kyphosis. Pt reported no adverse symptoms during the session with SpO2 remaining around 94 on RA and HR in the mid 80s at rest and with activity. Pt will benefit from continued PT services upon discharge to safely address deficits listed in patient problem list for decreased caregiver assistance and eventual return to PLOF.       If plan is discharge home, recommend the following: A little help with walking and/or transfers;A little help with bathing/dressing/bathroom;Assistance with cooking/housework;Assist for transportation;Help with stairs or ramp for entrance   Can travel by private vehicle        Equipment Recommendations    Recommendations for Other Services        Functional Status Assessment Patient has had a recent decline in their functional status and demonstrates the ability to make significant improvements in function in a reasonable and predictable amount of time.     Precautions / Restrictions Precautions Precautions: Fall Recall of Precautions/Restrictions: Intact Restrictions Weight Bearing Restrictions Per Provider Order: No      Mobility  Bed Mobility Overal bed mobility: Needs Assistance             General bed mobility comments: pt was found in chair on arrival. Pt was able to move from sitting to edge of chair with assistance and using arm rails    Transfers Overall transfer level: Needs assistance Equipment used: Rolling walker (2 wheels) Transfers: Sit to/from Stand Sit to Stand: Contact guard assist           General transfer comment: needing cueing for hand placement and UE use/support with RW    Ambulation/Gait Ambulation/Gait assistance: Contact guard assist Gait Distance (Feet): 25 Feet Assistive device: Rolling walker (2 wheels) Gait Pattern/deviations: Step-to pattern Gait velocity: decreased   Pre-gait activities: marching General Gait Details: pt needed curing for UE support with RW, and cueing for placement of feet within in the RW -- stepping into the frame as she advances the walker  Stairs            Wheelchair Mobility     Tilt Bed    Modified Rankin (Stroke Patients Only)       Balance Overall balance assessment: Needs assistance Sitting-balance support: Feet supported Sitting balance-Leahy Scale: Good Sitting balance - Comments: needed cueing to  help with upper body support when sitting upright in the chair without back supported Postural control: Posterior lean (slight when sitting) Standing balance support: Single extremity supported, During functional activity, Reliant on assistive device for balance Standing balance-Leahy Scale: Fair Standing balance comment: able  to weight shift and march in place without LOB                             Pertinent Vitals/Pain Pain Assessment Pain Assessment: 0-10 Pain Score: 7  Pain Location: HA was 7/10 Pain Descriptors / Indicators: Discomfort, Heaviness Pain Intervention(s): Monitored during session, Repositioned, Patient requesting pain meds-RN notified, Limited activity within patient's tolerance (nursing notified about needing pain meds)    Home Living Family/patient expects to be discharged to:: Private residence Living Arrangements: Spouse/significant other;Children Available Help at Discharge: Family;Available 24 hours/day (children are able to help out 24/7) Type of Home: House Home Access: Stairs to enter Entrance Stairs-Rails: Left Entrance Stairs-Number of Steps: 2   Home Layout: One level Home Equipment: Rolling Walker (2 wheels);Grab bars - tub/shower;Shower seat - built in;BSC/3in1;Cane - single point      Prior Function Prior Level of Function : Needs assist             Mobility Comments: amb with RW home/short community distances, reports at least 4 falls in the last 6 months ADLs Comments: needs assistance with ADLs - mod I     Extremity/Trunk Assessment        Lower Extremity Assessment Lower Extremity Assessment: Generalized weakness    Cervical / Trunk Assessment Cervical / Trunk Assessment: Kyphotic  Communication   Communication Communication: No apparent difficulties    Cognition Arousal: Alert Behavior During Therapy: WFL for tasks assessed/performed   PT - Cognitive impairments: No apparent impairments                         Following commands: Impaired Following commands impaired: Follows one step commands with increased time     Cueing Cueing Techniques: Verbal cues, Tactile cues, Gestural cues     General Comments General comments (skin integrity, edema, etc.): Pain, HA, vitals and symptoms assess throughout due to BP being on  the low side prior to session. Pt remained asymptomatic throughout the session and tolerated sitting, standing, and ambulation in the room without any adverse events.    Exercises     Assessment/Plan    PT Assessment Patient needs continued PT services  PT Problem List Decreased strength;Decreased range of motion;Decreased activity tolerance;Decreased balance;Decreased mobility;Decreased coordination;Decreased knowledge of use of DME;Decreased safety awareness;Pain       PT Treatment Interventions Gait training;Stair training;Functional mobility training;Therapeutic activities;Therapeutic exercise;DME instruction;Balance training;Patient/family education    PT Goals (Current goals can be found in the Care Plan section)  Acute Rehab PT Goals Patient Stated Goal: getting back home PT Goal Formulation: With patient/family Time For Goal Achievement: 10/16/24 Potential to Achieve Goals: Good    Frequency Min 2X/week     Co-evaluation               AM-PAC PT 6 Clicks Mobility  Outcome Measure Help needed turning from your back to your side while in a flat bed without using bedrails?: A Little Help needed moving from lying on your back to sitting on the side of a flat bed without using bedrails?: A Little Help needed moving to and from a bed to a chair (including a  wheelchair)?: A Little Help needed standing up from a chair using your arms (e.g., wheelchair or bedside chair)?: A Little Help needed to walk in hospital room?: A Little Help needed climbing 3-5 steps with a railing? : A Lot 6 Click Score: 17    End of Session Equipment Utilized During Treatment: Gait belt Activity Tolerance: Patient tolerated treatment well;No increased pain Patient left: in chair;with chair alarm set;with call bell/phone within reach Nurse Communication: Mobility status PT Visit Diagnosis: Unsteadiness on feet (R26.81);Muscle weakness (generalized) (M62.81);Repeated falls (R29.6);History of  falling (Z91.81);Pain Pain - Right/Left: Left Pain - part of body: Knee;Leg (cellulitis)    Time: 9063-8997 PT Time Calculation (min) (ACUTE ONLY): 26 min   Charges:                Corean Newport, SPT 10/03/24, 11:56 AM

## 2024-10-04 DIAGNOSIS — L03116 Cellulitis of left lower limb: Secondary | ICD-10-CM | POA: Diagnosis not present

## 2024-10-04 DIAGNOSIS — D693 Immune thrombocytopenic purpura: Secondary | ICD-10-CM

## 2024-10-04 DIAGNOSIS — M7042 Prepatellar bursitis, left knee: Secondary | ICD-10-CM

## 2024-10-04 DIAGNOSIS — N179 Acute kidney failure, unspecified: Secondary | ICD-10-CM | POA: Diagnosis not present

## 2024-10-04 LAB — CBC
HCT: 32.5 % — ABNORMAL LOW (ref 36.0–46.0)
Hemoglobin: 11.1 g/dL — ABNORMAL LOW (ref 12.0–15.0)
MCH: 30.4 pg (ref 26.0–34.0)
MCHC: 34.2 g/dL (ref 30.0–36.0)
MCV: 89 fL (ref 80.0–100.0)
Platelets: 66 K/uL — ABNORMAL LOW (ref 150–400)
RBC: 3.65 MIL/uL — ABNORMAL LOW (ref 3.87–5.11)
RDW: 17.4 % — ABNORMAL HIGH (ref 11.5–15.5)
WBC: 10.6 K/uL — ABNORMAL HIGH (ref 4.0–10.5)
nRBC: 0 % (ref 0.0–0.2)

## 2024-10-04 LAB — BASIC METABOLIC PANEL WITH GFR
Anion gap: 8 (ref 5–15)
BUN: 15 mg/dL (ref 8–23)
CO2: 18 mmol/L — ABNORMAL LOW (ref 22–32)
Calcium: 7.9 mg/dL — ABNORMAL LOW (ref 8.9–10.3)
Chloride: 107 mmol/L (ref 98–111)
Creatinine, Ser: 1.23 mg/dL — ABNORMAL HIGH (ref 0.44–1.00)
GFR, Estimated: 47 mL/min — ABNORMAL LOW (ref >60)
Glucose, Bld: 185 mg/dL — ABNORMAL HIGH (ref 70–99)
Potassium: 4.4 mmol/L (ref 3.5–5.1)
Sodium: 133 mmol/L — ABNORMAL LOW (ref 135–145)

## 2024-10-04 LAB — GLUCOSE, CAPILLARY
Glucose-Capillary: 154 mg/dL — ABNORMAL HIGH (ref 70–99)
Glucose-Capillary: 134 mg/dL — ABNORMAL HIGH (ref 70–99)
Glucose-Capillary: 134 mg/dL — ABNORMAL HIGH (ref 70–99)
Glucose-Capillary: 227 mg/dL — ABNORMAL HIGH (ref 70–99)

## 2024-10-04 MED ORDER — LINEZOLID 600 MG/300ML IV SOLN
600.0000 mg | Freq: Two times a day (BID) | INTRAVENOUS | Status: DC
  Administered 2024-10-04 – 2024-10-07 (×5): 600 mg via INTRAVENOUS
  Filled 2024-10-04 (×6): qty 300

## 2024-10-04 NOTE — Progress Notes (Signed)
 Mobility Specialist - Progress Note   10/04/24 1412  Mobility  Activity Ambulated with assistance  Level of Assistance Contact guard assist, steadying assist  Assistive Device Front wheel walker  Distance Ambulated (ft) 8 ft  Activity Response Tolerated well  Mobility visit 1 Mobility  Mobility Specialist Start Time (ACUTE ONLY) 1350  Mobility Specialist Stop Time (ACUTE ONLY) 1409  Mobility Specialist Time Calculation (min) (ACUTE ONLY) 19 min   Pt supine upon entry, utilizing RA. Pt amb around the bed CGA and returned supine--- expressed feels like my knee is on fire ~11ft into amb. Pt required VC's for redirection to task during amb, however easily redirectable. Pt left semi fowler with alarm set and needs within reach.  Yolanda Harris Mobility Specialist 10/04/24 2:18 PM

## 2024-10-04 NOTE — Progress Notes (Signed)
 PROGRESS NOTE    Yolanda Harris  FMW:[id number] DOB: 01-24-1956 DOA: 10/01/2024 PCP: Albina GORMAN Dine, MD  Chief Complaint  Patient presents with   Wound Infection    Possible DVT    Hospital Course:  Yolanda Harris is a 68 y.o. female with medical history significant of HTN, HLD, DM, depression, PAF not on anticoagulants, thrombocytopenia, sciatica, obesity, chronic pain syndrome, who presents with left lower leg pain, has small wound for which patient follows up in wound care center.  Admitted for left lower leg cellulitis, receiving IV antibiotics.  Hospital course as below  Subjective: Patient was examined at the bedside,  denies headaches which resolved Continues to have Left knee pain, swelling and erythema ID consult for further recs   Objective: Vitals:   10/04/24 0542 10/04/24 0616 10/04/24 0618 10/04/24 0749  BP: (!) 166/125 (!) 95/41 (!) 96/44 (!) 91/43  Pulse: 85 91 91 93  Resp: 18   20  Temp: 98.5 F (36.9 C)   99.4 F (37.4 C)  TempSrc:      SpO2: 95%   94%    Intake/Output Summary (Last 24 hours) at 10/04/2024 1631 Last data filed at 10/04/2024 0816 Gross per 24 hour  Intake 541.56 ml  Output --  Net 541.56 ml   Examination: General: Not in acute distress Cardiac: S1/S2, RRR, No murmurs, No gallops or rubs Respiratory: No rales, wheezing, rhonchi or rubs GI: Soft, nondistended, nontender, no rebound pain, no organomegaly, BS present. Ext: Has swelling, erythema, tenderness, warmth in left lower leg upto knee.  Has a small wound in the left lower leg Musculoskeletal: No joint deformities, No joint redness or warmth, no limitation of ROM in spin. Skin: No rashes.  Neuro: Alert, oriented X3, cranial nerves II-XII grossly intact, moves all extremities normally.   Assessment & Plan:  Cellulitis of left lower extremity - ESR, CRP elevated. Lactic acidosis resolved, does not meet criteria for sepsis - Leukocytosis improving - US  LLE neg for  DVT - Bcx NGTD - Seen by Ortho, appreciate recs - no intervention. Unlikely septic arthritis - On IV Vancomycin, Ceftriaxone - ID consult for further recs - Seen by WOC consulted - PRN Zofran  for nausea, and Tylenol, morphine  and Percocet for pain  AKI Baseline creatinine 0.93 recently.  Her creatinine is 1.20 -> 1.15,  Likely due to dehydration and continuation of her Mobic , Lasix  and Benicar. - Pending Urine sodium, cr, urea - Hold Lasix , Mobic , Benicar - s/p IV fluids, Monitor Cr  Hypokalemia - resolved - Mag normal - Monitor and replete as needed   HTN (hypertension): - Hold Benicar, Lasix  due to AKI, Hold Amlodipine  due to soft BP - Continue metoprolol  with holding parameters - IV hydralazine as needed   HLD (hyperlipidemia) - Crestor   PAF (paroxysmal atrial fibrillation) [facility]): Patient is not taking anticoagulants.  Heart rate 70s - Continue metoprolol , not taking amiodarone    Diabetes mellitus without complication [facility]): Recent A1c 7.1, poorly controlled.  Patient is taking Jardiance and Ozempic  - SSI   Thrombocytopenia - This is chronic issue.  Platelet 50k.  No active bleeding. - Follow-up by CBC   Depression -Amitriptyline   Obesity (BMI 30-39.9): Patient has Obesity Class II, with body weight 105 Kg and BMI 37.37 kg/m2.  - Encourage losing weight - Exercise and healthy diet  Chronic right hand weakness - Follow up outpatient  -- PT/OT rec Home PT/OT  DVT prophylaxis: SCD to right leg, cannot use heparin/Lovenox due to thrombocytopenia  Code Status: Full Code Disposition:  Home with home services  Consultants:  Treatment Team:  Consulting Physician: Lorelle Hussar, MDOrthopedics Infectious disease  Procedures:  None  Antimicrobials:  Anti-infectives (From admission, onward)    Start     Dose/Rate Route Frequency Ordered Stop   10/02/24 2230  vancomycin (VANCOCIN) IVPB 1000 mg/200 mL premix        1,000 mg 200 mL/hr over 60 Minutes  Intravenous Every 24 hours 10/02/24 0144     10/02/24 2200  cefTRIAXone (ROCEPHIN) 2 g in sodium chloride 0.9 % 100 mL IVPB        2 g 200 mL/hr over 30 Minutes Intravenous Every 24 hours 10/01/24 2336     10/01/24 2200  vancomycin (VANCOREADY) IVPB 2000 mg/400 mL        2,000 mg 200 mL/hr over 120 Minutes Intravenous  Once 10/01/24 2147 10/02/24 0115   10/01/24 2145  cefTRIAXone (ROCEPHIN) 2 g in sodium chloride 0.9 % 100 mL IVPB        2 g 200 mL/hr over 30 Minutes Intravenous  Once 10/01/24 2138 10/01/24 2231       Data Reviewed: I have personally reviewed following labs and imaging studies CBC: Recent Labs  Lab 10/01/24 1706 10/02/24 0411 10/03/24 0605 10/04/24 0317  WBC 19.2* 12.6* 11.7* 10.6*  HGB 13.9 11.1* 11.3* 11.1*  HCT 41.2 33.5* 32.9* 32.5*  MCV 90.5 92.3 89.2 89.0  PLT 60* 50* 58* 66*   Basic Metabolic Panel: Recent Labs  Lab 10/01/24 1706 10/02/24 0411 10/03/24 0605 10/04/24 0317  NA 136 139 136 133*  K 4.2 3.2* 4.2 4.4  CL 105 110 110 107  CO2 21* 19* 18* 18*  GLUCOSE 243* 206* 145* 185*  BUN 12 14 14 15   CREATININE 1.21* 1.20* 1.15* 1.23*  CALCIUM  9.0 7.9* 8.0* 7.9*  MG  --  2.3 2.2  --    GFR: Estimated Creatinine Clearance: 52.9 mL/min (A) (by C-G formula based on SCr of 1.23 mg/dL (H)). Liver Function Tests: No results for input(s): AST, ALT, ALKPHOS, BILITOT, PROT, ALBUMIN in the last 168 hours. CBG: Recent Labs  Lab 10/03/24 1135 10/03/24 1729 10/03/24 2117 10/04/24 0750 10/04/24 1139  GLUCAP 226* 196* 93 154* 134*    Recent Results (from the past 240 hours)  Blood Culture (routine x 2)     Status: None (Preliminary result)   Collection Time: 10/01/24  9:50 PM   Specimen: BLOOD  Result Value Ref Range Status   Specimen Description BLOOD LEFT ANTECUBITAL  Final   Special Requests   Final    BOTTLES DRAWN AEROBIC AND ANAEROBIC Blood Culture results may not be optimal due to an inadequate volume of blood received in  culture bottles   Culture   Final    NO GROWTH 3 DAYS Performed at [facility], [address] [place], [place] [address]    Report Status PENDING  Incomplete  Blood Culture (routine x 2)     Status: None (Preliminary result)   Collection Time: 10/01/24 10:05 PM   Specimen: BLOOD  Result Value Ref Range Status   Specimen Description BLOOD BLOOD LEFT FOREARM  Final   Special Requests   Final    BOTTLES DRAWN AEROBIC AND ANAEROBIC Blood Culture results may not be optimal due to an inadequate volume of blood received in culture bottles   Culture   Final    NO GROWTH 3 DAYS Performed at [facility], [address] [place],  [place] [address]    Report Status PENDING  Incomplete     Radiology Studies: No results found.   Scheduled Meds:  amitriptyline  25 mg Oral q morning   insulin aspart  0-5 Units Subcutaneous QHS   insulin aspart  0-9 Units Subcutaneous TID WC   metoprolol  tartrate  25 mg Oral BID   pregabalin   300 mg Oral BID   rosuvastatin  20 mg Oral Daily   senna-docusate  1 tablet Oral BID   Continuous Infusions:  cefTRIAXone (ROCEPHIN)  IV Stopped (10/03/24 2231)   vancomycin Stopped (10/03/24 2342)     LOS: 2 days  MDM: Patient is high risk for one or more organ failure.  They necessitate ongoing hospitalization for continued IV therapies and subsequent lab monitoring. Total time spent interpreting labs and vitals, reviewing the medical record, coordinating care amongst consultants and care team members, directly assessing and discussing care with the patient and/or family: 55 min Laree Lock, MD [facility]  To contact the attending physician between 7A-7P please use Epic Chat. To contact the covering physician during after hours 7P-7A, please review Amion.  10/04/2024, 4:31 PM   *This document has been created with the assistance of dictation software. Please excuse typographical errors. *

## 2024-10-04 NOTE — Progress Notes (Signed)
 Physical Therapy Treatment Patient Details Name: Yolanda Harris MRN: [id number] DOB: March 10, 1956 Today's Date: 10/04/2024   History of Present Illness Pt is a 68 year old female admitted with Cellulitis of left lower extremity    PMH significant for HTN, HLD, DM, depression, PAF not on anticoagulants, thrombocytopenia, sciatica, obesity, chronic pain syndrome. MD dx includes: Cellulitis L LE, AKI, HTN, HLD, PAF, DM, Thrombocytopenia, Depression, Obesity.    PT Comments  Pt was found in bed a little sleepy but responded to voice. She was lethargic and confused but willing to participate during the session and put forth good effort throughout despite pain. Pt performed bed mobility with max A, transfer for sit to stand mod A, and balance in standing with mod A. Pt was unable to attempt gait during this session due to L knee pain and poor sequencing. Pt had difficulty following commands for mobility this session, required max multimodal of all mobility tasks. Pt will benefit from continued PT services upon discharge to safely address deficits listed in patient problem list for decreased caregiver assistance and eventual return to PLOF.    If plan is discharge home, recommend the following: A little help with walking and/or transfers;A little help with bathing/dressing/bathroom;Assistance with cooking/housework;Assist for transportation;Help with stairs or ramp for entrance   Can travel by private vehicle        Equipment Recommendations       Recommendations for Other Services       Precautions / Restrictions Precautions Precautions: Fall Recall of Precautions/Restrictions: Intact Restrictions Weight Bearing Restrictions Per Provider Order: No     Mobility  Bed Mobility Overal bed mobility: Needs Assistance Bed Mobility: Supine to Sit     Supine to sit: Max assist, +2 for physical assistance Sit to supine: Max assist, +2 for physical assistance   General bed mobility comments:  needed assistance with LE placement towards EOB, increased time, cueing for hand placement and proper sequencing    Transfers Overall transfer level: Needs assistance Equipment used: Rolling walker (2 wheels) Transfers: Sit to/from Stand Sit to Stand: Mod assist, +2 physical assistance           General transfer comment: cueing for sequencing and hand placement from EOB, +2 mod A    Ambulation/Gait Ambulation/Gait assistance:  (not able)                 Stairs             Wheelchair Mobility     Tilt Bed    Modified Rankin (Stroke Patients Only)       Balance Overall balance assessment: Needs assistance Sitting-balance support: Feet supported, bilat extremity supported (max A) Sitting balance-Leahy Scale: Good Sitting balance - Comments: needed cueing to help with upper body support when sitting upright EOB Postural control: Posterior lean Standing balance support: Bilateral upper extremity supported, During functional activity Standing balance-Leahy Scale: Poor Standing balance comment: unable to tolerate standing, difficulty putting weight through L LEand had a difficult time following simple commands                            Communication Communication Communication: Impaired (very lethargic)  Cognition Arousal: Alert Behavior During Therapy: WFL for tasks assessed/performed   PT - Cognitive impairments: No apparent impairments                         Following commands: Impaired Following commands  impaired: Follows one step commands with increased time    Cueing Cueing Techniques: Verbal cues, Tactile cues, Gestural cues  Exercises      General Comments General comments (skin integrity, edema, etc.): Pt was fatigued and light headed throughout the session, SpO2 remained WNL, HR 88 consistently, BP 93/51, 95/71, 90/65 (pre during and post transitional movements). Pt was warm to the touch and L LE was significanlty more  swollen and oozing a litte (nursing notified). Pt was confused and having trouble following commands - nursing notified.       Pertinent Vitals/Pain Pain Assessment Pain Assessment: Faces Faces Pain Scale: Hurts a little bit Pain Location: LLE Pain Descriptors / Indicators: Aching, Heaviness, Sore Pain Intervention(s): Repositioned, Monitored during session, Limited activity within patient's tolerance    Home Living                          Prior Function            PT Goals (current goals can now be found in the care plan section) Acute Rehab PT Goals Patient Stated Goal: getting back home PT Goal Formulation: With patient/family Time For Goal Achievement: 10/16/24 Potential to Achieve Goals: Good Progress towards PT goals: Progressing toward goals    Frequency    Min 2X/week      PT Plan      Co-evaluation              AM-PAC PT 6 Clicks Mobility   Outcome Measure  Help needed turning from your back to your side while in a flat bed without using bedrails?: A Little Help needed moving from lying on your back to sitting on the side of a flat bed without using bedrails?: A Little Help needed moving to and from a bed to a chair (including a wheelchair)?: A Little Help needed standing up from a chair using your arms (e.g., wheelchair or bedside chair)?: A Little Help needed to walk in hospital room?: A Little Help needed climbing 3-5 steps with a railing? : A Lot 6 Click Score: 17    End of Session Equipment Utilized During Treatment: Gait belt Activity Tolerance: Patient limited by fatigue;Patient limited by lethargy;Patient limited by pain Patient left: in bed;with call bell/phone within reach;with bed alarm set;with family/visitor present Nurse Communication: Mobility status Pain - Right/Left: Left Pain - part of body: Knee;Leg (L knee notably more swollen/oozing today (nursing notified))     Time:  -     Charges:                            Corean Newport, SPT 10/04/24, 4:25 PM

## 2024-10-04 NOTE — Consult Note (Signed)
 NAME: Yolanda Harris  DOB: Oct 20, 1956  MRN: [id number]  Date/Time: 10/04/2024 12:08 PM  REQUESTING PROVIDER: Dr. Jerelene Subjective:  REASON FOR CONSULT: Left leg cellulitis Daughter and husband at bedside They provide history Patient is a limited historian. Yolanda Harris is a 68 y.o. female with a history of diabetes mellitus, hypertension, paroxysmal atrial fibrillation, hyperlipidemia, thrombocytopenia, obesity, chronic pain syndrome presents from home with left leg swelling, redness and pain of more than 1 week.  Started with a small wound on the anterior left lower shin.  She has been followed at the wound care center.  That placed her wrapping on the leg a week ago and on reevaluation today it was found that the leg was more swollen and red spreading up to the knee. On arrival in the ED on 10/01/2024 vitals were BP of 135/62, temperature 97.3, pulse 75 and respirate rate of 18 WBC was 19.2, Hb 13.9, platelets 60 and creatinine of 1.21. Blood cultures were sent.  X-ray of the knee did not show any fracture or dislocation.  A venous ultrasound of the leg did not show any DVT.  She was started on IV vancomycin and ceftriaxone.  There was a concern for septic knee and she was evaluated by Ortho who thought septic arthritis was low clinical suspicion. I am asked to see the patient for the cellulitis. Patient's family states that she had fallen 2 weeks ago Also the daughter states one of her medications pioglitazone  was stopped last month and the swelling started after that  daughter thinks this happened before as well  patient is sitting in the bed She is feeling better  Past Medical History:  Diagnosis Date   Diabetes mellitus without complication (HCC)    HLD (hyperlipidemia)    Hypertension    PAF (paroxysmal atrial fibrillation) (HCC)    Sciatic leg pain    Thrombocytopenia     Past Surgical History:  Procedure Laterality Date   ABDOMINAL HYSTERECTOMY     HTN      Social  History   Socioeconomic History   Marital status: Married    Spouse name: Not on file   Number of children: Not on file   Years of education: Not on file   Highest education level: Not on file  Occupational History   Not on file  Tobacco Use   Smoking status: Former    Current packs/day: 0.50    Types: Cigarettes   Smokeless tobacco: Never  Vaping Use   Vaping status: Never Used  Substance and Sexual Activity   Alcohol use: Not Currently   Drug use: Never   Sexual activity: Not on file  Other Topics Concern   Not on file  Social History Narrative   Not on file   Social Drivers of Health   Financial Resource Strain: Not on file  Food Insecurity: No Food Insecurity (10/02/2024)   Hunger Vital Sign    Worried About Running Out of Food in the Last Year: Never true    Ran Out of Food in the Last Year: Never true  Transportation Needs: No Transportation Needs (10/02/2024)   PRAPARE - Administrator, Civil Service (Medical): No    Lack of Transportation (Non-Medical): No  Physical Activity: Not on file  Stress: Not on file  Social Connections: Moderately Integrated (10/02/2024)   Social Connection and Isolation Panel    Frequency of Communication with Friends and Family: Three times a week    Frequency of Social  Gatherings with Friends and Family: Once a week    Attends Religious Services: Never    Database Administrator or Organizations: Yes    Attends Banker Meetings: Never    Marital Status: Married  Catering Manager Violence: Not At Risk (10/02/2024)   Humiliation, Afraid, Rape, and Kick questionnaire    Fear of Current or Ex-Partner: No    Emotionally Abused: No    Physically Abused: No    Sexually Abused: No    Family History  Problem Relation Age of Onset   Breast cancer Neg Hx    Allergies  Allergen Reactions   Amiodarone  Shortness Of Breath   Sulfa Antibiotics Rash   I? Current Facility-Administered Medications  Medication  Dose Route Frequency Provider Last Rate Last Admin   acetaminophen (TYLENOL) tablet 650 mg  650 mg Oral Q6H PRN Niu, Xilin, MD   650 mg at 10/03/24 1014   amitriptyline (ELAVIL) tablet 25 mg  25 mg Oral q morning Niu, Xilin, MD   25 mg at 10/04/24 1015   cefTRIAXone (ROCEPHIN) 2 g in sodium chloride 0.9 % 100 mL IVPB  2 g Intravenous Q24H Niu, Xilin, MD   Stopped at 10/03/24 2231   cyclobenzaprine  (FLEXERIL ) tablet 10 mg  10 mg Oral TID PRN Dail Rankin RAMAN, RPH   10 mg at 10/03/24 1014   insulin aspart (novoLOG) injection 0-5 Units  0-5 Units Subcutaneous QHS Niu, Xilin, MD   2 Units at 10/02/24 2128   insulin aspart (novoLOG) injection 0-9 Units  0-9 Units Subcutaneous TID WC Niu, Xilin, MD   2 Units at 10/04/24 1021   metoprolol  tartrate (LOPRESSOR ) tablet 25 mg  25 mg Oral BID Ponnala, Shruthi, MD   25 mg at 10/04/24 1015   morphine  (PF) 2 MG/ML injection 2 mg  2 mg Intravenous Q4H PRN Niu, Xilin, MD   2 mg at 10/04/24 0145   ondansetron  (ZOFRAN ) injection 4 mg  4 mg Intravenous Q8H PRN Niu, Xilin, MD       polyethylene glycol (MIRALAX / GLYCOLAX) packet 17 g  17 g Oral Daily PRN Niu, Xilin, MD   17 g at 10/02/24 9083   pregabalin  (LYRICA ) capsule 300 mg  300 mg Oral BID Niu, Xilin, MD   300 mg at 10/04/24 1015   rosuvastatin (CRESTOR) tablet 20 mg  20 mg Oral Daily Niu, Xilin, MD   20 mg at 10/04/24 1015   senna-docusate (Senokot-S) tablet 1 tablet  1 tablet Oral BID Niu, Xilin, MD   1 tablet at 10/04/24 1015   vancomycin (VANCOCIN) IVPB 1000 mg/200 mL premix  1,000 mg Intravenous Q24H Dail Rankin RAMAN, RPH   Stopped at 10/03/24 2342     Abtx:  Anti-infectives (From admission, onward)    Start     Dose/Rate Route Frequency Ordered Stop   10/02/24 2230  vancomycin (VANCOCIN) IVPB 1000 mg/200 mL premix        1,000 mg 200 mL/hr over 60 Minutes Intravenous Every 24 hours 10/02/24 0144     10/02/24 2200  cefTRIAXone (ROCEPHIN) 2 g in sodium chloride 0.9 % 100 mL IVPB        2 g 200 mL/hr  over 30 Minutes Intravenous Every 24 hours 10/01/24 2336     10/01/24 2200  vancomycin (VANCOREADY) IVPB 2000 mg/400 mL        2,000 mg 200 mL/hr over 120 Minutes Intravenous  Once 10/01/24 2147 10/02/24 0115   10/01/24 2145  cefTRIAXone (ROCEPHIN)  2 g in sodium chloride 0.9 % 100 mL IVPB        2 g 200 mL/hr over 30 Minutes Intravenous  Once 10/01/24 2138 10/01/24 2231       REVIEW OF SYSTEMS:  Const: fever,  chills, negative weight loss Eyes: negative diplopia or visual changes, negative eye pain ENT: negative coryza, negative sore throat Resp: negative cough, hemoptysis, dyspnea Cards: negative for chest pain, palpitations, lower extremity edema GU: negative for frequency, dysuria and hematuria GI: Negative for abdominal pain, diarrhea, bleeding, constipation Skin: as above Heme: negative for easy bruising and gum/nose bleeding MS left leg pain General Weakness Neurolo:negative for headaches, dizziness, vertigo, memory problems  Psych: negative for feelings of anxiety, depression  Endocrine:  diabetes Allergy/Immunology-sulfa and amiodarone : Objective:  VITALS:  BP (!) 91/43 (BP Location: Left Arm)   Pulse 93   Temp 99.4 F (37.4 C)   Resp 20   SpO2 94%   PHYSICAL EXAM:  General: Awake, a bit lethargic But responds to commands Increased BMI Head: Normocephalic, without obvious abnormality, atraumatic. Eyes: Conjunctivae clear, anicteric sclerae. Pupils are equal ENT Nares normal. No drainage or sinus tenderness. Lips, mucosa, and tongue normal. No Thrush  Lungs: Bilateral air entry Heart: S1-S2 abdomen: Soft, non-tender,not distended. Bowel sounds normal. No masses Extremities: Both legs have venous pigmentation Left leg s unwrapped the dressing Swelling of the leg less than before She has fluctuant erythematous area around the knee which looks like prepatellar bursitis the movement of the knee is possible up to 65 degrees 10/04/2024 : 10/01/2024   Lymph:  Cervical, supraclavicular normal. Neurologic: Grossly non-focal Pertinent Labs Lab Results CBC    Component Value Date/Time   WBC 10.6 (H) 10/04/2024 0317   RBC 3.65 (L) 10/04/2024 0317   HGB 11.1 (L) 10/04/2024 0317   HGB 12.7 09/27/2024 1432   HGB 13.1 10/12/2023 1045   HCT 32.5 (L) 10/04/2024 0317   HCT 39.1 10/12/2023 1045   PLT 66 (L) 10/04/2024 0317   PLT 61 (L) 09/27/2024 1432   PLT 89 (LL) 10/12/2023 1045   MCV 89.0 10/04/2024 0317   MCV 93 10/12/2023 1045   MCH 30.4 10/04/2024 0317   MCHC 34.2 10/04/2024 0317   RDW 17.4 (H) 10/04/2024 0317   RDW 15.7 (H) 10/12/2023 1045   LYMPHSABS 2.2 09/27/2024 1432   LYMPHSABS 3.3 (H) 10/12/2023 1045   MONOABS 1.0 09/27/2024 1432   EOSABS 0.4 09/27/2024 1432   EOSABS 0.5 (H) 10/12/2023 1045   BASOSABS 0.1 09/27/2024 1432   BASOSABS 0.1 10/12/2023 1045       Latest Ref Rng & Units 10/04/2024    3:17 AM 10/03/2024    6:05 AM 10/02/2024    4:11 AM  CMP  Glucose 70 - 99 mg/dL 814  854  793   BUN 8 - 23 mg/dL 15  14  14    Creatinine 0.44 - 1.00 mg/dL 8.76  8.84  8.79   Sodium 135 - 145 mmol/L 133  136  139   Potassium 3.5 - 5.1 mmol/L 4.4  4.2  3.2   Chloride 98 - 111 mmol/L 107  110  110   CO2 22 - 32 mmol/L 18  18  19    Calcium  8.9 - 10.3 mg/dL 7.9  8.0  7.9       Microbiology: Recent Results (from the past 240 hours)  Blood Culture (routine x 2)     Status: None (Preliminary result)   Collection Time: 10/01/24  9:50 PM  Specimen: BLOOD  Result Value Ref Range Status   Specimen Description BLOOD LEFT ANTECUBITAL  Final   Special Requests   Final    BOTTLES DRAWN AEROBIC AND ANAEROBIC Blood Culture results may not be optimal due to an inadequate volume of blood received in culture bottles   Culture   Final    NO GROWTH 3 DAYS Performed at [facility], [address], [place], [place] [address]    Report Status PENDING  Incomplete  Blood Culture (routine x 2)     Status: None (Preliminary result)    Collection Time: 10/01/24 10:05 PM   Specimen: BLOOD  Result Value Ref Range Status   Specimen Description BLOOD BLOOD LEFT FOREARM  Final   Special Requests   Final    BOTTLES DRAWN AEROBIC AND ANAEROBIC Blood Culture results may not be optimal due to an inadequate volume of blood received in culture bottles   Culture   Final    NO GROWTH 3 DAYS Performed at [facility], [address], [place], [place] [address]    Report Status PENDING  Incomplete      IMAGING RESULTS: Ultrasound of the  left leg no DVT I have personally reviewed the films ? Impression/Recommendation Severe cellulitis of the left leg with underlying venous edema There is prepatellar bursitis of the left knee Unlikely to have septic arthritis Common organisms are Staphylococcus and Streptococcus Patient is currently on vancomycin and ceftriaxone Agree with Ortho to not  aspirate the left knee Will discontinue vancomycin and give linezolid for a short period of time to help with better skin and soft tissue penetration  Watch closely the platelet count while on linezolid  Idiopathic thrombocytopenia  Diabetes mellitus management as per primary team  Obesity  AKI DC Vanco and observe closely  This consult involve complex antimicrobial management I have personally spent  -75--minutes involved in face-to-face and non-face-to-face activities for this patient on the day of the visit. Professional time spent includes the following activities: Preparing to see the patient (review of tests), Obtaining and/or reviewing separately obtained history (admission/discharge record), Performing a medically appropriate examination and/or evaluation , Ordering medications/tests/procedures, referring and communicating with other health care professionals, Documenting clinical information in the EMR, Independently interpreting results (not separately reported), Communicating results to the patient/family, Counseling and  educating the patient/family/ and Care coordination with hospitalist and ID pharmacist___  ID will not routinely follow her this weekend.  Call if needed. Note:  This document was prepared using Dragon voice recognition software and may include unintentional dictation errors.

## 2024-10-04 NOTE — Inpatient Diabetes Management (Signed)
 Inpatient Diabetes Program Recommendations  AACE/ADA: New Consensus Statement on Inpatient Glycemic Control   Target Ranges:  Prepandial:   less than 140 mg/dL      Peak postprandial:   less than 180 mg/dL (1-2 hours)      Critically ill patients:  140 - 180 mg/dL    Latest Reference Range & Units 10/03/24 08:15 10/03/24 11:35 10/03/24 17:29 10/03/24 21:17 10/04/24 07:50  Glucose-Capillary 70 - 99 mg/dL 810 (H) 773 (H) 803 (H) 93 154 (H)   Review of Glycemic Control  Diabetes history: DM2 Outpatient Diabetes medications: Jardiance 25 mg daily, Actos  45 mg daily (not taking), Ozempic  2 mg Qweek, Tresiba  96 units daily (not taking) Current orders for Inpatient glycemic control: Novolog 0-9 units TID with meals, Novolog 0-5 units QHS  Inpatient Diabetes Program Recommendations:    Insulin: If post prandial glucose is consistently over 180 mg/dl, consider ordering Novolog 2 units TID with meals for meal coverage if patient eats at least 50% of meals.  Thanks, Earnie Gainer, RN, MSN, CDCES Diabetes Coordinator Inpatient Diabetes Program [phone] (Team Pager from 8am to 5pm)

## 2024-10-05 ENCOUNTER — Inpatient Hospital Stay

## 2024-10-05 ENCOUNTER — Other Ambulatory Visit: Payer: Self-pay

## 2024-10-05 DIAGNOSIS — R579 Shock, unspecified: Secondary | ICD-10-CM | POA: Diagnosis not present

## 2024-10-05 DIAGNOSIS — R7401 Elevation of levels of liver transaminase levels: Secondary | ICD-10-CM | POA: Diagnosis not present

## 2024-10-05 DIAGNOSIS — A419 Sepsis, unspecified organism: Secondary | ICD-10-CM

## 2024-10-05 DIAGNOSIS — I4891 Unspecified atrial fibrillation: Secondary | ICD-10-CM

## 2024-10-05 DIAGNOSIS — L03116 Cellulitis of left lower limb: Secondary | ICD-10-CM | POA: Diagnosis not present

## 2024-10-05 DIAGNOSIS — I480 Paroxysmal atrial fibrillation: Secondary | ICD-10-CM | POA: Diagnosis not present

## 2024-10-05 LAB — BLOOD GAS, VENOUS
Acid-base deficit: 6.1 mmol/L — ABNORMAL HIGH (ref 0.0–2.0)
Bicarbonate: 18.9 mmol/L — ABNORMAL LOW (ref 20.0–28.0)
O2 Saturation: 61.1 %
Patient temperature: 37
pCO2, Ven: 35 mmHg — ABNORMAL LOW (ref 44–60)
pH, Ven: 7.34 (ref 7.25–7.43)
pO2, Ven: 34 mmHg (ref 32–45)

## 2024-10-05 LAB — BASIC METABOLIC PANEL WITH GFR
Anion gap: 9 (ref 5–15)
BUN: 18 mg/dL (ref 8–23)
CO2: 16 mmol/L — ABNORMAL LOW (ref 22–32)
Calcium: 7.7 mg/dL — ABNORMAL LOW (ref 8.9–10.3)
Chloride: 107 mmol/L (ref 98–111)
Creatinine, Ser: 1.66 mg/dL — ABNORMAL HIGH (ref 0.44–1.00)
GFR, Estimated: 33 mL/min — ABNORMAL LOW (ref >60)
Glucose, Bld: 146 mg/dL — ABNORMAL HIGH (ref 70–99)
Potassium: 4.2 mmol/L (ref 3.5–5.1)
Sodium: 132 mmol/L — ABNORMAL LOW (ref 135–145)

## 2024-10-05 LAB — GLUCOSE, CAPILLARY
Glucose-Capillary: 142 mg/dL — ABNORMAL HIGH (ref 70–99)
Glucose-Capillary: 218 mg/dL — ABNORMAL HIGH (ref 70–99)
Glucose-Capillary: 184 mg/dL — ABNORMAL HIGH (ref 70–99)
Glucose-Capillary: 184 mg/dL — ABNORMAL HIGH (ref 70–99)

## 2024-10-05 LAB — CBC
HCT: 30.6 % — ABNORMAL LOW (ref 36.0–46.0)
Hemoglobin: 10.7 g/dL — ABNORMAL LOW (ref 12.0–15.0)
MCH: 30.2 pg (ref 26.0–34.0)
MCHC: 35 g/dL (ref 30.0–36.0)
MCV: 86.4 fL (ref 80.0–100.0)
Platelets: 77 K/uL — ABNORMAL LOW (ref 150–400)
RBC: 3.54 MIL/uL — ABNORMAL LOW (ref 3.87–5.11)
RDW: 17.2 % — ABNORMAL HIGH (ref 11.5–15.5)
WBC: 10.3 K/uL (ref 4.0–10.5)
nRBC: 0.2 % (ref 0.0–0.2)

## 2024-10-05 LAB — HEPATIC FUNCTION PANEL
ALT: 168 U/L — ABNORMAL HIGH (ref 0–44)
AST: 665 U/L — ABNORMAL HIGH (ref 15–41)
Albumin: 2.4 g/dL — ABNORMAL LOW (ref 3.5–5.0)
Alkaline Phosphatase: 190 U/L — ABNORMAL HIGH (ref 38–126)
Bilirubin, Direct: 2.1 mg/dL — ABNORMAL HIGH (ref 0.0–0.2)
Indirect Bilirubin: 0.7 mg/dL (ref 0.3–0.9)
Total Bilirubin: 2.9 mg/dL — ABNORMAL HIGH (ref 0.0–1.2)
Total Protein: 5.6 g/dL — ABNORMAL LOW (ref 6.5–8.1)

## 2024-10-05 LAB — MRSA NEXT GEN BY PCR, NASAL: MRSA by PCR Next Gen: NOT DETECTED

## 2024-10-05 LAB — TYPE AND SCREEN
ABO/RH(D): O POS
Antibody Screen: NEGATIVE

## 2024-10-05 LAB — PROTIME-INR
INR: 1.9 — ABNORMAL HIGH (ref 0.8–1.2)
Prothrombin Time: 23 s — ABNORMAL HIGH (ref 11.4–15.2)

## 2024-10-05 LAB — MAGNESIUM: Magnesium: 2.1 mg/dL (ref 1.7–2.4)

## 2024-10-05 LAB — LACTIC ACID, PLASMA
Lactic Acid, Venous: 3.9 mmol/L (ref 0.5–1.9)
Lactic Acid, Venous: 3.5 mmol/L (ref 0.5–1.9)

## 2024-10-05 LAB — SYNOVIAL FLUID, CRYSTAL: Crystals, Fluid: NONE SEEN

## 2024-10-05 LAB — TROPONIN T, HIGH SENSITIVITY
Troponin T High Sensitivity: 41 ng/L — ABNORMAL HIGH (ref 0–19)
Troponin T High Sensitivity: 44 ng/L — ABNORMAL HIGH (ref 0–19)

## 2024-10-05 LAB — APTT: aPTT: 41 s — ABNORMAL HIGH (ref 24–36)

## 2024-10-05 MED ORDER — AMIODARONE LOAD VIA INFUSION
150.0000 mg | Freq: Once | INTRAVENOUS | Status: AC
  Administered 2024-10-05: 150 mg via INTRAVENOUS
  Filled 2024-10-05: qty 83.34

## 2024-10-05 MED ORDER — AMIODARONE HCL IN DEXTROSE 360-4.14 MG/200ML-% IV SOLN
60.0000 mg/h | INTRAVENOUS | Status: AC
  Administered 2024-10-05 (×2): 60 mg/h via INTRAVENOUS
  Filled 2024-10-05 (×2): qty 200

## 2024-10-05 MED ORDER — SODIUM CHLORIDE 0.9 % IV BOLUS: 250.0000 mL | Freq: Once | INTRAVENOUS | Status: DC

## 2024-10-05 MED ORDER — LACTATED RINGERS IV BOLUS
500.0000 mL | Freq: Once | INTRAVENOUS | Status: AC
  Administered 2024-10-05: 500 mL via INTRAVENOUS

## 2024-10-05 MED ORDER — ALBUMIN HUMAN 25 % IV SOLN
25.0000 g | Freq: Once | INTRAVENOUS | Status: AC
  Administered 2024-10-05: 25 g via INTRAVENOUS
  Filled 2024-10-05: qty 100

## 2024-10-05 MED ORDER — ALBUMIN HUMAN 25 % IV SOLN: 25.0000 g | Freq: Once | INTRAVENOUS | Status: DC

## 2024-10-05 MED ORDER — AMIODARONE HCL IN DEXTROSE 360-4.14 MG/200ML-% IV SOLN
30.0000 mg/h | INTRAVENOUS | Status: DC
  Administered 2024-10-06: 30 mg/h via INTRAVENOUS
  Filled 2024-10-05 (×2): qty 200

## 2024-10-05 MED ORDER — LACTATED RINGERS IV BOLUS
1000.0000 mL | Freq: Once | INTRAVENOUS | Status: AC
  Administered 2024-10-05: 1000 mL via INTRAVENOUS

## 2024-10-05 MED ORDER — FENTANYL CITRATE (PF) 50 MCG/ML IJ SOSY
25.0000 ug | PREFILLED_SYRINGE | Freq: Once | INTRAMUSCULAR | Status: AC
  Administered 2024-10-05: 25 ug via INTRAVENOUS
  Filled 2024-10-05: qty 1

## 2024-10-05 MED ORDER — NOREPINEPHRINE 4 MG/250ML-% IV SOLN
0.0000 ug/min | INTRAVENOUS | Status: DC
  Administered 2024-10-05: 2 ug/min via INTRAVENOUS
  Administered 2024-10-06: 9 ug/min via INTRAVENOUS
  Administered 2024-10-06: 5 ug/min via INTRAVENOUS
  Administered 2024-10-06: 12 ug/min via INTRAVENOUS
  Administered 2024-10-06: 15 ug/min via INTRAVENOUS
  Administered 2024-10-07: 16 ug/min via INTRAVENOUS
  Administered 2024-10-07: 25 ug/min via INTRAVENOUS
  Filled 2024-10-05 (×6): qty 250

## 2024-10-05 MED ORDER — CHLORHEXIDINE GLUCONATE CLOTH 2 % EX PADS
6.0000 | MEDICATED_PAD | Freq: Every day | CUTANEOUS | Status: DC
  Administered 2024-10-05 – 2024-10-06 (×2): 6 via TOPICAL

## 2024-10-05 MED ORDER — CALCIUM GLUCONATE-NACL 1-0.675 GM/50ML-% IV SOLN
1.0000 g | Freq: Once | INTRAVENOUS | Status: AC
  Administered 2024-10-05: 1000 mg via INTRAVENOUS
  Filled 2024-10-05: qty 50

## 2024-10-05 MED ORDER — ORAL CARE MOUTH RINSE: 15.0000 mL | OROMUCOSAL | Status: DC | PRN

## 2024-10-05 NOTE — Progress Notes (Signed)
 Dr. Jerelene bedside to assess pt. Blood pressures remaining soft last checking  82/43 with HR sustaining 120s; pt remains in A-fib. Patient remains alert and asymptomatic. Decision to transfer pt to ICU and  give 552ml/bolus of fluids with STAT Albumin started.   Critical lab, Lactic acid 3.9, called in to this clinical research associate; provider and primary nurse notified. ICU NP bedside to assess. Will transfer pt to ICU when bed is available as plan of care continues.

## 2024-10-05 NOTE — Consult Note (Signed)
 ORTHOPAEDIC CONSULTATION  PATIENT NAME: Yolanda Harris DOB: Oct 15, 1956  MRN: [id number]  REQUESTING PHYSICIAN: Isadora Hose, MD  Chief Complaint: Left knee cellulitis/sepsis  HPI: Yolanda Harris is a 68 y.o. female who was admitted on 10/01/2024 for swelling and erythema to the left lower extremity. She was being treated by the Wound Care clinic for a wound to the lower leg. She had low grade fevers. Dr. Lorelle had been consulted and felt the presentation was consistent with cellulitis. The patient has been treated with ceftriaxone and Zyvox. She developed hypotension today and was transferred to the CCU. Previous blood cultures were no growth. The intensivist aspirated the left knee and obtained several milliliters of grossly purulent fluid with Gram (+) cocci on the Gram stain.  Past Medical History:  Diagnosis Date   Diabetes mellitus without complication (HCC)    HLD (hyperlipidemia)    Hypertension    PAF (paroxysmal atrial fibrillation) (HCC)    Sciatic leg pain    Thrombocytopenia    Past Surgical History:  Procedure Laterality Date   ABDOMINAL HYSTERECTOMY     HTN     Social History   Socioeconomic History   Marital status: Married    Spouse name: Not on file   Number of children: Not on file   Years of education: Not on file   Highest education level: Not on file  Occupational History   Not on file  Tobacco Use   Smoking status: Former    Current packs/day: 0.50    Types: Cigarettes   Smokeless tobacco: Never  Vaping Use   Vaping status: Never Used  Substance and Sexual Activity   Alcohol use: Not Currently   Drug use: Never   Sexual activity: Not on file  Other Topics Concern   Not on file  Social History Narrative   Not on file   Social Drivers of Health   Financial Resource Strain: Not on file  Food Insecurity: No Food Insecurity (10/02/2024)   Hunger Vital Sign    Worried About Running Out of Food in the Last Year: Never true    Ran  Out of Food in the Last Year: Never true  Transportation Needs: No Transportation Needs (10/02/2024)   PRAPARE - Administrator, Civil Service (Medical): No    Lack of Transportation (Non-Medical): No  Physical Activity: Not on file  Stress: Not on file  Social Connections: Moderately Integrated (10/02/2024)   Social Connection and Isolation Panel    Frequency of Communication with Friends and Family: Three times a week    Frequency of Social Gatherings with Friends and Family: Once a week    Attends Religious Services: Never    Database Administrator or Organizations: Yes    Attends Banker Meetings: Never    Marital Status: Married   Family History  Problem Relation Age of Onset   Breast cancer Neg Hx    Allergies  Allergen Reactions   Amiodarone  Shortness Of Breath   Sulfa Antibiotics Rash   Prior to Admission medications   Medication Sig Start Date End Date Taking? Authorizing Provider  amitriptyline (ELAVIL) 25 MG tablet TAKE 1 TABLET EVERY MORNING 01/09/24  Yes Tejan-Sie, GORMAN Dine, MD  amLODipine -benazepril  (LOTREL) 5-20 MG capsule Take 1 capsule by mouth daily. 07/30/24  Yes Tejan-Sie, GORMAN Dine, MD  atorvastatin  (LIPITOR) 10 MG tablet TAKE 1 TABLET EVERY EVENING 03/25/24  Yes Tejan-Sie, S Ahmed, MD  cyclobenzaprine  (FLEXERIL ) 10 MG tablet Take 1  tablet (10 mg total) by mouth 3 (three) times daily. 07/30/24  Yes Albina GORMAN Dine, MD  empagliflozin (JARDIANCE) 25 MG TABS tablet Take 25 mg by mouth daily. 09/16/24  Yes [provider]  furosemide  (LASIX ) 40 MG tablet Take 1 tablet (40 mg total) by mouth daily. 07/30/24 07/30/25 Yes Tejan-Sie, GORMAN Dine, MD  meloxicam  (MOBIC ) 15 MG tablet TAKE 1 TABLET EVERY DAY 03/25/24  Yes Tejan-Sie, S Ahmed, MD  metoprolol  tartrate (LOPRESSOR ) 25 MG tablet TAKE 1 TABLET TWICE DAILY 06/03/24  Yes Tejan-Sie, S Ahmed, MD  olmesartan (BENICAR) 40 MG tablet Take 1 tablet (40 mg total) by mouth daily. 09/06/24 10/06/24 Yes  Albina GORMAN Dine, MD  pregabalin  (LYRICA ) 300 MG capsule Take 1 capsule (300 mg total) by mouth 2 (two) times daily. 07/29/24 01/25/25 Yes Albina GORMAN Dine, MD  rosuvastatin (CRESTOR) 20 MG tablet Take 20 mg by mouth daily.   Yes [provider]  Semaglutide , 2 MG/DOSE, (OZEMPIC , 2 MG/DOSE,) 8 MG/3ML SOPN Inject 2 mg into the skin once a week. 07/19/24 10/11/24 Yes Albina GORMAN Dine, MD  triamcinolone cream (KENALOG) 0.1 % Apply 1 Application topically 2 (two) times daily.   Yes [provider]  Alcohol Swabs (DROPSAFE ALCOHOL PREP) 70 % PADS USE TO CLEAN FINGER BEFORE STICKING TO CHECK SUGAR AS DIRECTED 06/03/24   Albina GORMAN Dine, MD  amiodarone  (PACERONE ) 200 MG tablet Take 2 tablets by mouth 2 (two) times daily. Patient not taking: Reported on 09/27/2024    [provider]  cetirizine  (ZYRTEC  ALLERGY) 10 MG tablet Take 1 tablet (10 mg total) by mouth daily. Patient not taking: Reported on 10/02/2024 07/29/24 10/27/24  Albina GORMAN Dine, MD  fluticasone  (FLONASE ) 50 MCG/ACT nasal spray Place 1 spray into both nostrils daily. Patient not taking: Reported on 10/02/2024 07/08/24 10/06/24  Albina GORMAN Dine, MD  furosemide  (LASIX ) 40 MG tablet Take 1 tablet (40 mg total) by mouth 2 (two) times daily for 14 days. 09/06/24 09/27/24  Albina GORMAN Dine, MD  glucose blood (RELION TRUE METRIX TEST STRIPS) test strip Use with device to check sugars  up to three times daily 01/05/24   Albina GORMAN Dine, MD  pioglitazone  (ACTOS ) 45 MG tablet Take 1 tablet (45 mg total) by mouth every morning. Patient not taking: Reported on 10/02/2024 07/19/24   Albina GORMAN Dine, MD  TRESIBA  FLEXTOUCH 200 UNIT/ML FlexTouch Pen INJECT 96 UNITS SUBCUTANEOUSLY ONCE DAILY Patient not taking: Reported on 10/02/2024 08/16/24   Albina GORMAN Dine, MD  TRUEplus Lancets 33G MISC TEST BLOOD SUGAR UP TO THREE TIMES DAILY 06/03/24   Albina GORMAN Dine, MD  Zinc  Oxide 15 % CREA Apply to left leg wound  bid Patient not taking: No sig reported 02/13/23   Albina GORMAN Dine, MD   DG Chest 1 View Result Date: 10/05/2024 CLINICAL DATA:  Pulmonary edema. EXAM: CHEST  1 VIEW COMPARISON:  09/12/2024 FINDINGS: Lungs are hypoinflated without lobar consolidation or effusion. Minimal hazy prominence of the central pulmonary vessels likely due to the degree of hypoinflation. Mild stable cardiomegaly. Remainder of the exam is unchanged. IMPRESSION: 1. Hypoinflation without acute cardiopulmonary disease. 2. Mild stable cardiomegaly. Electronically Signed   By: Toribio Agreste M.D.   On: 10/05/2024 11:17    Positive ROS: All other systems have been reviewed and were otherwise negative with the exception of those mentioned in the HPI and as above.  Today's labs were reviewed. Previous knee radiographs were reviewed.  Assessment: Septic left knee  Plan: I  recommend arthroscopic irrigation and debridement of the left knee. Orders were placed and the case has been posted for tomorrow.  The patient is currently on low dose vasopressors. I will review her status with Anesthesiology tomorrow.  Sumie Remsen P. Elisabeth Strom, Jr. M.D.

## 2024-10-05 NOTE — Progress Notes (Signed)
 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 She was referred as a direct admit for IV abx (rocephin + vancomycin -> linezolid) and imaging. ID consulted, guiding antibiotic therapy. US  LLE venous negative for DVT. Upon admission, she was febrile and had blood cultures drawn (11/11; NGTD). Orthopedics consulted, low suspicion of septic arthritis, risk of bacterial translocation outweighs benefit of arthrocentesis.   PCCM consulted 11/15 to evaluate for transfer iso hypotension (MAPs 50s). Found patient to be in Afib with RVR (off home beta blocker), receiving fluid boluses (2.5L total), transiently responsive. Lactate 3.5-3.9, non-anion gap metabolic acidosis. Electrolyte abnormalities include mild hyponatremia, hypocalcemia iso AKI (sCr baseline <1.0).   Pertinent  Medical History  HTN HLD IDDM PAF Peripheral venous HTN Obesity Altered mobility Thrombocytopenia Lymphadenopathy at level of thoracic inlet concerning for lymphoproliferative disease  Significant Hospital Events: Including procedures, antibiotic start and stop dates in addition to other pertinent events   11/15: admit from wound clinic for worsening cellulitis LLE. Transfer to ICU for septic shock, afib with RVR.  Interim History /  Subjective:  Feeling weak. Endorses palpitations with elevated HR  Objective    Blood pressure (!) 82/43, pulse (!) 127, temperature 98 F (36.7 C), temperature source Axillary, resp. rate 18, SpO2 100%.        Intake/Output Summary (Last 24 hours) at 10/05/2024 1455 Last data filed at 10/04/2024 2350 Gross per 24 hour  Intake 640 ml  Output --  Net 640 ml   There were no vitals filed for this visit.  Examination: General: NAD HENT: OP clear, no lymphadenopathy Lungs: clear to auscultation bilaterally Cardiovascular: tachycardic, irregular, no murmur Abdomen: round soft, BSP Extremities: left LE with ace wrap, knee erythematous, boggy and hot to touch Neuro: alert, oriented x 4. Moves all ext to command GU: deferred  Resolved problem list   Assessment and Plan   #Left lower extremity wounds #LLE cellulitis - blood cultures x 2 ordered (previous 11/11 NGTD) - ortho consulted and following - continue rocephin and zyvox  #Fever #Distributive shock - s/p 2.5L IVF boluses - monitor fever curve - abx as above - support with levophed to maintain MAP 65 mmHg or better  #PAF with RVR In the setting of inflammatory state and not receiving her home beta blocker. Allergy to amio (SOB) - EKG, troponins (expect some trop leak iso afib) - replete electrolytes as needed (goal K>4, Mg>2, Ca>8.5), check daily - start amio bolus + gtt (watch resp status, LFTs)  #Transaminitis #Elevated INR - likely related to poor perfusion but will rule out biliary causes with US  liver (11/16) - monitor for bleeding - daily LFTs  #AKI #Hypocalcemia - avoid nephrotoxic agents - optimize perfusion - ordered FeUrea  Labs   CBC: Recent Labs  Lab 10/01/24 1706 10/02/24 0411 10/03/24 0605 10/04/24 0317 10/05/24 0612  WBC 19.2* 12.6* 11.7* 10.6* 10.3  HGB 13.9  11.1* 11.3* 11.1* 10.7*  HCT 41.2 33.5* 32.9* 32.5* 30.6*  MCV 90.5 92.3 89.2 89.0 86.4  PLT 60* 50* 58* 66* 77*     Basic Metabolic Panel: Recent Labs  Lab 10/01/24 1706 10/02/24 0411 10/03/24 0605 10/04/24 0317 10/05/24 0612  NA 136 139 136 133* 132*  K 4.2 3.2* 4.2 4.4 4.2  CL 105 110 110 107 107  CO2 21* 19* 18* 18* 16*  GLUCOSE 243* 206* 145* 185* 146*  BUN 12 14 14 15 18   CREATININE 1.21* 1.20* 1.15* 1.23* 1.66*  CALCIUM  9.0 7.9* 8.0* 7.9* 7.7*  MG  --  2.3 2.2  --   --    GFR: Estimated Creatinine Clearance: 39.2 mL/min (A) (by C-G formula based on SCr of 1.66 mg/dL (H)). Recent Labs  Lab 10/02/24 0224 10/02/24 0411 10/02/24 0618 10/03/24 0605 10/04/24 0317 10/05/24 0612 10/05/24 1250  WBC  --  12.6*  --  11.7* 10.6* 10.3  --   LATICACIDVEN 1.6 1.8 1.4  --   --   --  3.9*    Liver Function Tests: No results for input(s): AST, ALT, ALKPHOS, BILITOT, PROT, ALBUMIN in the last 168 hours. No results for input(s): LIPASE, AMYLASE in the last 168 hours. No results for input(s): AMMONIA in the last 168 hours.  ABG No results found for: PHART, PCO2ART, PO2ART, HCO3, TCO2, ACIDBASEDEF, O2SAT   Coagulation Profile: Recent Labs  Lab 10/02/24 0224  INR 1.7*    Cardiac Enzymes: No results for input(s): CKTOTAL, CKMB, CKMBINDEX, TROPONINI in the last 168 hours.  HbA1C: Hgb A1c MFr Bld  Date/Time Value Ref Range Status  07/19/2024 02:58 PM 7.1 (H) 4.8 - 5.6 % Final    Comment:             Prediabetes: 5.7 - 6.4          Diabetes: >6.4          Glycemic control for adults with diabetes: <7.0   04/18/2024 02:58 PM 7.8 (H) 4.8 - 5.6 % Final    Comment:             Prediabetes: 5.7 - 6.4          Diabetes: >6.4          Glycemic control for adults with diabetes: <7.0     CBG: Recent Labs  Lab 10/04/24 1139 10/04/24 1713 10/04/24 2053 10/05/24 0804 10/05/24 1140  GLUCAP 134* 134* 227* 142* 218*    Review of Systems:   As reviewed in HPI  Past Medical History:  She,  has a past medical history of Diabetes mellitus  without complication (HCC), HLD (hyperlipidemia), Hypertension, PAF (paroxysmal atrial fibrillation) (HCC), Sciatic leg pain, and Thrombocytopenia.   Surgical History:   Past Surgical History:  Procedure Laterality Date   ABDOMINAL HYSTERECTOMY     HTN       Social History:   reports that she has quit smoking. Her smoking use included cigarettes. She has never used smokeless tobacco. She reports that she does not currently use alcohol. She reports that she does not use drugs.   Family History:  Her family history is negative for Breast cancer.   Allergies Allergies  Allergen Reactions   Amiodarone  Shortness Of Breath   Sulfa Antibiotics Rash     Home Medications  Prior to Admission medications   Medication Sig Start Date End Date Taking? Authorizing Provider  amitriptyline (ELAVIL) 25 MG tablet TAKE 1 TABLET EVERY MORNING 01/09/24  Yes  Albina GORMAN Dine, MD  amLODipine -benazepril  (LOTREL) 5-20 MG capsule Take 1 capsule by mouth daily. 07/30/24  Yes Tejan-Sie, GORMAN Dine, MD  atorvastatin  (LIPITOR) 10 MG tablet TAKE 1 TABLET EVERY EVENING 03/25/24  Yes Tejan-Sie, S Ahmed, MD  cyclobenzaprine  (FLEXERIL ) 10 MG tablet Take 1 tablet (10 mg total) by mouth 3 (three) times daily. 07/30/24  Yes Albina GORMAN Dine, MD  empagliflozin (JARDIANCE) 25 MG TABS tablet Take 25 mg by mouth daily. 09/16/24  Yes [provider]  furosemide  (LASIX ) 40 MG tablet Take 1 tablet (40 mg total) by mouth daily. 07/30/24 07/30/25 Yes Tejan-Sie, GORMAN Dine, MD  meloxicam  (MOBIC ) 15 MG tablet TAKE 1 TABLET EVERY DAY 03/25/24  Yes Tejan-Sie, S Ahmed, MD  metoprolol  tartrate (LOPRESSOR ) 25 MG tablet TAKE 1 TABLET TWICE DAILY 06/03/24  Yes Tejan-Sie, S Ahmed, MD  olmesartan (BENICAR) 40 MG tablet Take 1 tablet (40 mg total) by mouth daily. 09/06/24 10/06/24 Yes Albina GORMAN Dine, MD  pregabalin  (LYRICA ) 300 MG capsule Take 1 capsule (300 mg total) by mouth 2 (two) times daily. 07/29/24 01/25/25 Yes Albina GORMAN Dine, MD   rosuvastatin (CRESTOR) 20 MG tablet Take 20 mg by mouth daily.   Yes [provider]  Semaglutide , 2 MG/DOSE, (OZEMPIC , 2 MG/DOSE,) 8 MG/3ML SOPN Inject 2 mg into the skin once a week. 07/19/24 10/11/24 Yes Albina GORMAN Dine, MD  triamcinolone cream (KENALOG) 0.1 % Apply 1 Application topically 2 (two) times daily.   Yes [provider]  Alcohol Swabs (DROPSAFE ALCOHOL PREP) 70 % PADS USE TO CLEAN FINGER BEFORE STICKING TO CHECK SUGAR AS DIRECTED 06/03/24   Albina GORMAN Dine, MD  amiodarone  (PACERONE ) 200 MG tablet Take 2 tablets by mouth 2 (two) times daily. Patient not taking: Reported on 09/27/2024    [provider]  cetirizine  (ZYRTEC  ALLERGY) 10 MG tablet Take 1 tablet (10 mg total) by mouth daily. Patient not taking: Reported on 10/02/2024 07/29/24 10/27/24  Albina GORMAN Dine, MD  fluticasone  (FLONASE ) 50 MCG/ACT nasal spray Place 1 spray into both nostrils daily. Patient not taking: Reported on 10/02/2024 07/08/24 10/06/24  Albina GORMAN Dine, MD  furosemide  (LASIX ) 40 MG tablet Take 1 tablet (40 mg total) by mouth 2 (two) times daily for 14 days. 09/06/24 09/27/24  Albina GORMAN Dine, MD  glucose blood (RELION TRUE METRIX TEST STRIPS) test strip Use with device to check sugars  up to three times daily 01/05/24   Albina GORMAN Dine, MD  pioglitazone  (ACTOS ) 45 MG tablet Take 1 tablet (45 mg total) by mouth every morning. Patient not taking: Reported on 10/02/2024 07/19/24   Albina GORMAN Dine, MD  TRESIBA  FLEXTOUCH 200 UNIT/ML FlexTouch Pen INJECT 96 UNITS SUBCUTANEOUSLY ONCE DAILY Patient not taking: Reported on 10/02/2024 08/16/24   Albina GORMAN Dine, MD  TRUEplus Lancets 33G MISC TEST BLOOD SUGAR UP TO THREE TIMES DAILY 06/03/24   Tejan-Sie, S Ahmed, MD  Zinc  Oxide 15 % CREA Apply to left leg wound bid Patient not taking: No sig reported 02/13/23   Albina GORMAN Dine, MD     Critical care time: 90 min    Debbie Katz, ACNP-BC Pulmonary Critical Care,  [place] Phone: [phone]

## 2024-10-05 NOTE — Progress Notes (Signed)
 PROGRESS NOTE    Yolanda Harris  FMW:[id number] DOB: [birth year] DOA: 10/01/2024 PCP: Albina GORMAN Dine, MD  Chief Complaint  Patient presents with   Wound Infection    Possible DVT    Hospital Course:  Yolanda Harris is a 68 y.o. female with medical history significant of HTN, HLD, DM, depression, PAF not on anticoagulants, thrombocytopenia, sciatica, obesity, chronic pain syndrome, who presents with left lower leg pain, has small wound for which patient follows up in wound care center.  Admitted for left lower leg cellulitis, receiving IV antibiotics.  Hospital course as below  Subjective: Patient was examined at the bedside,  is very sleepy/lethargic todat. Answering questions appropriately though Blood pressure low, lactic acid elevated.  Minimal improvement in blood pressure with IV fluids and IV albumin Also had fever 100.5 this morning Discussed with PCCM, transferred to ICU for vasopressors Discussed with family at the bedside   Objective: Vitals:   10/05/24 1129 10/05/24 1222 10/05/24 1238 10/05/24 1255  BP: (!) 78/45 (!) 88/55 (!) 88/47 (!) 84/36  Pulse: (!) 125 (!) 132 (!) 125 (!) 126  Resp:  20 20 18   Temp:  97.9 F (36.6 C) 98.4 F (36.9 C) 98 F (36.7 C)  TempSrc:    Axillary  SpO2:  95% 95% 95%    Intake/Output Summary (Last 24 hours) at 10/05/2024 1335 Last data filed at 10/04/2024 2350 Gross per 24 hour  Intake 640 ml  Output --  Net 640 ml   Examination: General: Lethargic, hypotensive Cardiac: S1/S2, RRR, No murmurs, No gallops or rubs Respiratory: No rales, wheezing, rhonchi or rubs GI: Soft, nondistended, nontender, no rebound pain, no organomegaly, BS present. Ext: Has swelling, erythema, tenderness, warmth in left lower leg upto knee.  Has a small wound in the left lower leg Musculoskeletal: No joint deformities, No joint redness or warmth, no limitation of ROM in spin. Neuro: Lethargic, answering questions appropriately  Assessment &  Plan:  Septic shock Cellulitis of left lower extremity - Leukocytosis improving.  Blood pressure low, with minimal improvement with IV fluids and albumin. LA elevated - US  LLE neg for DVT - Bcx NGTD - Seen by Ortho, appreciate recs - no intervention. Unlikely septic arthritis - Seen by ID, changed IV Vancomycin to Linezolid - Continue IV Linezolid, Ceftriaxone - Seen by WOC - PRN Zofran  for nausea, and Tylenol, morphine  and Percocet for pain  AKI Baseline creatinine 0.93 recently.  Her creatinine is 1.20 -> 1.15 -> 1.66,  Likely due to dehydration and continuation of her Mobic , Lasix  and Benicar. - Pending Urine sodium, cr, urea - Hold Lasix , Mobic , Benicar. Was on Vancomycin - IV fluids due to hypotension, Monitor Cr  Hypokalemia - resolved - Mag normal - Monitor and replete as needed   HTN (hypertension): - Hold Benicar, Lasix  due to AKI, Hold Amlodipine , metoprolol  due to soft BP - IV hydralazine as needed   HLD (hyperlipidemia) - Crestor   PAF (paroxysmal atrial fibrillation) (HCC): Patient is not taking anticoagulants - Hold metoprolol  due to hypotension, not taking amiodarone    Diabetes mellitus without complication [facility]): Recent A1c 7.1, poorly controlled.  Patient is taking Jardiance and Ozempic  - SSI   Thrombocytopenia - This is chronic issue. No active bleeding. - Also on Linezolid - Follow-up by CBC   Depression -Amitriptyline   Obesity (BMI 30-39.9): Patient has Obesity Class II, with body weight 105 Kg and BMI 37.37 kg/m2.  - Encourage losing weight - Exercise and healthy diet  Chronic  right hand weakness - Follow up outpatient  -- PT/OT rec Home PT/OT  DVT prophylaxis: SCD to right leg, cannot use heparin/Lovenox due to thrombocytopenia   Code Status: Full Code Disposition:  Home with home services  Consultants:  Treatment Team:  Consulting Physician: Lorelle Hussar, MDOrthopedics Infectious disease  Procedures:  None  Antimicrobials:   Anti-infectives (From admission, onward)    Start     Dose/Rate Route Frequency Ordered Stop   10/04/24 2200  linezolid (ZYVOX) IVPB 600 mg        600 mg 300 mL/hr over 60 Minutes Intravenous Every 12 hours 10/04/24 1700     10/02/24 2230  vancomycin (VANCOCIN) IVPB 1000 mg/200 mL premix  Status:  Discontinued        1,000 mg 200 mL/hr over 60 Minutes Intravenous Every 24 hours 10/02/24 0144 10/04/24 1700   10/02/24 2200  cefTRIAXone (ROCEPHIN) 2 g in sodium chloride 0.9 % 100 mL IVPB        2 g 200 mL/hr over 30 Minutes Intravenous Every 24 hours 10/01/24 2336     10/01/24 2200  vancomycin (VANCOREADY) IVPB 2000 mg/400 mL        2,000 mg 200 mL/hr over 120 Minutes Intravenous  Once 10/01/24 2147 10/02/24 0115   10/01/24 2145  cefTRIAXone (ROCEPHIN) 2 g in sodium chloride 0.9 % 100 mL IVPB        2 g 200 mL/hr over 30 Minutes Intravenous  Once 10/01/24 2138 10/01/24 2231       Data Reviewed: I have personally reviewed following labs and imaging studies CBC: Recent Labs  Lab 10/01/24 1706 10/02/24 0411 10/03/24 0605 10/04/24 0317 10/05/24 0612  WBC 19.2* 12.6* 11.7* 10.6* 10.3  HGB 13.9 11.1* 11.3* 11.1* 10.7*  HCT 41.2 33.5* 32.9* 32.5* 30.6*  MCV 90.5 92.3 89.2 89.0 86.4  PLT 60* 50* 58* 66* 77*   Basic Metabolic Panel: Recent Labs  Lab 10/01/24 1706 10/02/24 0411 10/03/24 0605 10/04/24 0317 10/05/24 0612  NA 136 139 136 133* 132*  K 4.2 3.2* 4.2 4.4 4.2  CL 105 110 110 107 107  CO2 21* 19* 18* 18* 16*  GLUCOSE 243* 206* 145* 185* 146*  BUN 12 14 14 15 18   CREATININE 1.21* 1.20* 1.15* 1.23* 1.66*  CALCIUM  9.0 7.9* 8.0* 7.9* 7.7*  MG  --  2.3 2.2  --   --    GFR: Estimated Creatinine Clearance: 39.2 mL/min (A) (by C-G formula based on SCr of 1.66 mg/dL (H)). Liver Function Tests: No results for input(s): AST, ALT, ALKPHOS, BILITOT, PROT, ALBUMIN in the last 168 hours. CBG: Recent Labs  Lab 10/04/24 1139 10/04/24 1713 10/04/24 2053  10/05/24 0804 10/05/24 1140  GLUCAP 134* 134* 227* 142* 218*    Recent Results (from the past 240 hours)  Blood Culture (routine x 2)     Status: None (Preliminary result)   Collection Time: 10/01/24  9:50 PM   Specimen: BLOOD  Result Value Ref Range Status   Specimen Description BLOOD LEFT ANTECUBITAL  Final   Special Requests   Final    BOTTLES DRAWN AEROBIC AND ANAEROBIC Blood Culture results may not be optimal due to an inadequate volume of blood received in culture bottles   Culture   Final    NO GROWTH 4 DAYS Performed at [facility], [address] [place], [place] [address]    Report Status PENDING  Incomplete  Blood Culture (routine x 2)     Status: None (Preliminary  result)   Collection Time: 10/01/24 10:05 PM   Specimen: BLOOD  Result Value Ref Range Status   Specimen Description BLOOD BLOOD LEFT FOREARM  Final   Special Requests   Final    BOTTLES DRAWN AEROBIC AND ANAEROBIC Blood Culture results may not be optimal due to an inadequate volume of blood received in culture bottles   Culture   Final    NO GROWTH 4 DAYS Performed at [facility], [address], [place], [place] [address]    Report Status PENDING  Incomplete     Radiology Studies: DG Chest 1 View Result Date: 10/05/2024 CLINICAL DATA:  Pulmonary edema. EXAM: CHEST  1 VIEW COMPARISON:  09/12/2024 FINDINGS: Lungs are hypoinflated without lobar consolidation or effusion. Minimal hazy prominence of the central pulmonary vessels likely due to the degree of hypoinflation. Mild stable cardiomegaly. Remainder of the exam is unchanged. IMPRESSION: 1. Hypoinflation without acute cardiopulmonary disease. 2. Mild stable cardiomegaly. Electronically Signed   By: Toribio Agreste M.D.   On: 10/05/2024 11:17     Scheduled Meds:  amitriptyline  25 mg Oral q morning   insulin aspart  0-5 Units Subcutaneous QHS   insulin aspart  0-9 Units Subcutaneous TID WC   pregabalin   300 mg Oral BID    rosuvastatin  20 mg Oral Daily   senna-docusate  1 tablet Oral BID   Continuous Infusions:  albumin human     cefTRIAXone (ROCEPHIN)  IV Stopped (10/05/24 0726)   linezolid (ZYVOX) IV 600 mg (10/05/24 1013)   sodium chloride       LOS: 3 days  MDM: Patient is high risk for one or more organ failure.  They necessitate ongoing hospitalization for continued IV therapies and subsequent lab monitoring. Total time spent interpreting labs and vitals, reviewing the medical record, coordinating care amongst consultants and care team members, directly assessing and discussing care with the patient and/or family: 55 min Laree Lock, MD [facility]  To contact the attending physician between 7A-7P please use Epic Chat. To contact the covering physician during after hours 7P-7A, please review Amion.  10/05/2024, 1:35 PM   *This document has been created with the assistance of dictation software. Please excuse typographical errors. *

## 2024-10-05 NOTE — Consult Note (Signed)
 WOC team consulted for L lower extremity wound.  See consult note 10/02/2024.   Thank you,    Powell Bar MSN, RN-BC, [employer]

## 2024-10-05 NOTE — Plan of Care (Signed)

## 2024-10-05 NOTE — Progress Notes (Signed)
 Brief Note:  Patient had a knee effusion that was sampled and tapped. Gram stain is positive with gram positive cocci in pairs. Relayed result to orthopedic surgeon Dr. Mardee, tentative plan for knee washout tomorrow.       Belva November, MD [facility] 10/05/2024 7:33 PM

## 2024-10-05 NOTE — Procedures (Addendum)
 Arthrocentesis  Procedure Note  SORIAH LEEMAN  [id number]  Dec 03, 1955  Date:10/05/24  Time:6:12 PM   Provider Performing:Gabrien Mentink   Procedure: Diagnostic Arthrocentesis  Indication(s) Knee Effusion  Consent Risks of the procedure as well as the alternatives and risks of each were explained to the patient and/or caregiver.  Consent for the procedure was obtained and is signed in the bedside chart  Anesthesia 25 mcg of fentanyl   Time Out Verified patient identification, verified procedure, site/side was marked, verified correct patient position, special equipment/implants available, medications/allergies/relevant history reviewed, required imaging and test results available.   Sterile Technique Maximal sterile technique including full sterile barrier drape, hand hygiene, sterile gown, sterile gloves, mask, hair covering, sterile ultrasound probe cover (if used).  Procedure Description  Knee effusion was palpated, skin sterilized and drapped. An 18G needle was introduced and a few cc's of pus were aspirated. Further aspiration was not possible and procedure was terminated.     Complications/Tolerance None; patient tolerated the procedure well.  Belva November, MD [place] [facility] 10/05/2024 6:14 PM

## 2024-10-06 ENCOUNTER — Inpatient Hospital Stay: Admit: 2024-10-06 | Discharge: 2024-10-06 | Disposition: A

## 2024-10-06 ENCOUNTER — Inpatient Hospital Stay

## 2024-10-06 ENCOUNTER — Encounter: Payer: Self-pay | Admitting: Internal Medicine

## 2024-10-06 ENCOUNTER — Inpatient Hospital Stay: Admitting: Anesthesiology

## 2024-10-06 ENCOUNTER — Other Ambulatory Visit: Payer: Self-pay

## 2024-10-06 ENCOUNTER — Encounter: Admission: EM | Disposition: E | Payer: Self-pay | Source: Home / Self Care

## 2024-10-06 DIAGNOSIS — R579 Shock, unspecified: Secondary | ICD-10-CM | POA: Diagnosis not present

## 2024-10-06 DIAGNOSIS — I480 Paroxysmal atrial fibrillation: Secondary | ICD-10-CM | POA: Diagnosis not present

## 2024-10-06 DIAGNOSIS — L03116 Cellulitis of left lower limb: Secondary | ICD-10-CM | POA: Diagnosis not present

## 2024-10-06 DIAGNOSIS — A419 Sepsis, unspecified organism: Secondary | ICD-10-CM

## 2024-10-06 DIAGNOSIS — R9431 Abnormal electrocardiogram [ECG] [EKG]: Secondary | ICD-10-CM | POA: Diagnosis not present

## 2024-10-06 DIAGNOSIS — R7401 Elevation of levels of liver transaminase levels: Secondary | ICD-10-CM | POA: Diagnosis not present

## 2024-10-06 HISTORY — PX: IRRIGATION AND DEBRIDEMENT KNEE: SHX5185

## 2024-10-06 LAB — BASIC METABOLIC PANEL WITH GFR
Anion gap: 16 — ABNORMAL HIGH (ref 5–15)
BUN: 20 mg/dL (ref 8–23)
CO2: 14 mmol/L — ABNORMAL LOW (ref 22–32)
Calcium: 7.8 mg/dL — ABNORMAL LOW (ref 8.9–10.3)
Chloride: 101 mmol/L (ref 98–111)
Creatinine, Ser: 2.5 mg/dL — ABNORMAL HIGH (ref 0.44–1.00)
GFR, Estimated: 20 mL/min — ABNORMAL LOW (ref >60)
Glucose, Bld: 242 mg/dL — ABNORMAL HIGH (ref 70–99)
Potassium: 5 mmol/L (ref 3.5–5.1)
Sodium: 130 mmol/L — ABNORMAL LOW (ref 135–145)

## 2024-10-06 LAB — BLOOD GAS, VENOUS
Acid-base deficit: 8.1 mmol/L — ABNORMAL HIGH (ref 0.0–2.0)
Bicarbonate: 17 mmol/L — ABNORMAL LOW (ref 20.0–28.0)
O2 Saturation: 81.9 %
Patient temperature: 37
pCO2, Ven: 33 mmHg — ABNORMAL LOW (ref 44–60)
pH, Ven: 7.32 (ref 7.25–7.43)
pO2, Ven: 49 mmHg — ABNORMAL HIGH (ref 32–45)

## 2024-10-06 LAB — LACTIC ACID, PLASMA
Lactic Acid, Venous: 3.6 mmol/L (ref 0.5–1.9)
Lactic Acid, Venous: 4.1 mmol/L (ref 0.5–1.9)

## 2024-10-06 LAB — GLUCOSE, CAPILLARY
Glucose-Capillary: 189 mg/dL — ABNORMAL HIGH (ref 70–99)
Glucose-Capillary: 220 mg/dL — ABNORMAL HIGH (ref 70–99)
Glucose-Capillary: 164 mg/dL — ABNORMAL HIGH (ref 70–99)
Glucose-Capillary: 175 mg/dL — ABNORMAL HIGH (ref 70–99)
Glucose-Capillary: 183 mg/dL — ABNORMAL HIGH (ref 70–99)

## 2024-10-06 LAB — CBC
HCT: 31.2 % — ABNORMAL LOW (ref 36.0–46.0)
Hemoglobin: 10.9 g/dL — ABNORMAL LOW (ref 12.0–15.0)
MCH: 30.1 pg (ref 26.0–34.0)
MCHC: 34.9 g/dL (ref 30.0–36.0)
MCV: 86.2 fL (ref 80.0–100.0)
Platelets: 90 K/uL — ABNORMAL LOW (ref 150–400)
RBC: 3.62 MIL/uL — ABNORMAL LOW (ref 3.87–5.11)
RDW: 17.7 % — ABNORMAL HIGH (ref 11.5–15.5)
WBC: 13.3 K/uL — ABNORMAL HIGH (ref 4.0–10.5)
nRBC: 0 % (ref 0.0–0.2)

## 2024-10-06 LAB — HEPATIC FUNCTION PANEL
ALT: 222 U/L — ABNORMAL HIGH (ref 0–44)
AST: 870 U/L — ABNORMAL HIGH (ref 15–41)
Albumin: 2.4 g/dL — ABNORMAL LOW (ref 3.5–5.0)
Alkaline Phosphatase: 239 U/L — ABNORMAL HIGH (ref 38–126)
Bilirubin, Direct: 2.6 mg/dL — ABNORMAL HIGH (ref 0.0–0.2)
Indirect Bilirubin: 1.3 mg/dL — ABNORMAL HIGH (ref 0.3–0.9)
Total Bilirubin: 3.9 mg/dL — ABNORMAL HIGH (ref 0.0–1.2)
Total Protein: 5.9 g/dL — ABNORMAL LOW (ref 6.5–8.1)

## 2024-10-06 LAB — PREALBUMIN: Prealbumin: <5 mg/dL — ABNORMAL LOW (ref 18–38)

## 2024-10-06 LAB — CULTURE, BLOOD (ROUTINE X 2)
Culture: NO GROWTH
Culture: NO GROWTH

## 2024-10-06 LAB — TROPONIN T, HIGH SENSITIVITY: Troponin T High Sensitivity: 60 ng/L — ABNORMAL HIGH (ref 0–19)

## 2024-10-06 LAB — CREATININE, URINE, RANDOM: Creatinine, Urine: 78 mg/dL

## 2024-10-06 LAB — MAGNESIUM: Magnesium: 2.2 mg/dL (ref 1.7–2.4)

## 2024-10-06 LAB — SODIUM, URINE, RANDOM: Sodium, Ur: <30 mmol/L

## 2024-10-06 SURGERY — IRRIGATION AND DEBRIDEMENT KNEE
Anesthesia: General | Site: Knee | Laterality: Left

## 2024-10-06 MED ORDER — ACETAMINOPHEN 325 MG PO TABS: 325.0000 mg | ORAL_TABLET | Freq: Four times a day (QID) | ORAL | Status: DC | PRN

## 2024-10-06 MED ORDER — LACTATED RINGERS IV BOLUS
500.0000 mL | Freq: Once | INTRAVENOUS | Status: AC
  Administered 2024-10-06: 500 mL via INTRAVENOUS

## 2024-10-06 MED ORDER — PROPOFOL 10 MG/ML IV BOLUS
INTRAVENOUS | Status: DC | PRN
Start: 2024-10-06 — End: 2024-10-06
  Administered 2024-10-06: 40 mg via INTRAVENOUS
  Administered 2024-10-06: 50 mg via INTRAVENOUS
  Administered 2024-10-06: 30 mg via INTRAVENOUS

## 2024-10-06 MED ORDER — PHENYLEPHRINE 80 MCG/ML (10ML) SYRINGE FOR IV PUSH (FOR BLOOD PRESSURE SUPPORT)
PREFILLED_SYRINGE | INTRAVENOUS | Status: DC | PRN
Start: 2024-10-06 — End: 2024-10-06
  Administered 2024-10-06 (×5): 160 ug via INTRAVENOUS
  Administered 2024-10-06 (×2): 80 ug via INTRAVENOUS

## 2024-10-06 MED ORDER — ASPIRIN 81 MG PO CHEW: 81.0000 mg | CHEWABLE_TABLET | Freq: Two times a day (BID) | ORAL | Status: DC

## 2024-10-06 MED ORDER — ONDANSETRON HCL 4 MG/2ML IJ SOLN: 4.0000 mg | Freq: Four times a day (QID) | INTRAMUSCULAR | Status: DC | PRN

## 2024-10-06 MED ORDER — ACETAMINOPHEN 10 MG/ML IV SOLN
INTRAVENOUS | Status: AC
  Filled 2024-10-06: qty 100

## 2024-10-06 MED ORDER — ALUM & MAG HYDROXIDE-SIMETH 200-200-20 MG/5ML PO SUSP
30.0000 mL | ORAL | Status: DC | PRN
Start: 2024-10-06 — End: 2024-10-07

## 2024-10-06 MED ORDER — LACTATED RINGERS IV SOLN: INTRAVENOUS | Status: DC | PRN

## 2024-10-06 MED ORDER — VASOPRESSIN 20 UNIT/ML IV SOLN
INTRAVENOUS | Status: DC | PRN
Start: 2024-10-06 — End: 2024-10-06
  Administered 2024-10-06: 1 [IU] via INTRAVENOUS
  Administered 2024-10-06 (×6): 2 [IU] via INTRAVENOUS

## 2024-10-06 MED ORDER — ONDANSETRON HCL 4 MG/2ML IJ SOLN
INTRAMUSCULAR | Status: AC
  Filled 2024-10-06: qty 2

## 2024-10-06 MED ORDER — METOCLOPRAMIDE HCL 10 MG PO TABS
10.0000 mg | ORAL_TABLET | Freq: Three times a day (TID) | ORAL | Status: DC
  Filled 2024-10-06 (×3): qty 1

## 2024-10-06 MED ORDER — PROPOFOL 10 MG/ML IV BOLUS
INTRAVENOUS | Status: AC
  Filled 2024-10-06: qty 20

## 2024-10-06 MED ORDER — HYDROGEN PEROXIDE 3 % EX SOLN
CUTANEOUS | Status: DC | PRN
  Administered 2024-10-06: 1

## 2024-10-06 MED ORDER — DICLOFENAC SODIUM 1 % EX GEL
2.0000 g | Freq: Three times a day (TID) | CUTANEOUS | Status: DC | PRN
  Administered 2024-10-06: 2 g via TOPICAL
  Filled 2024-10-06: qty 100

## 2024-10-06 MED ORDER — SODIUM CHLORIDE 0.9% FLUSH: 10.0000 mL | INTRAVENOUS | Status: DC | PRN

## 2024-10-06 MED ORDER — FENTANYL CITRATE (PF) 50 MCG/ML IJ SOSY: 25.0000 ug | PREFILLED_SYRINGE | Freq: Once | INTRAMUSCULAR | Status: AC

## 2024-10-06 MED ORDER — MAGNESIUM HYDROXIDE 400 MG/5ML PO SUSP: 30.0000 mL | Freq: Every day | ORAL | Status: DC

## 2024-10-06 MED ORDER — ONDANSETRON HCL 4 MG/2ML IJ SOLN
INTRAMUSCULAR | Status: DC | PRN
  Administered 2024-10-06: 4 mg via INTRAVENOUS

## 2024-10-06 MED ORDER — SODIUM CHLORIDE 0.9 % IR SOLN
Status: DC | PRN
  Administered 2024-10-06: 1000 mL

## 2024-10-06 MED ORDER — PANTOPRAZOLE SODIUM 40 MG PO TBEC: 40.0000 mg | DELAYED_RELEASE_TABLET | Freq: Two times a day (BID) | ORAL | Status: DC

## 2024-10-06 MED ORDER — FERROUS SULFATE 325 (65 FE) MG PO TABS: 325.0000 mg | ORAL_TABLET | Freq: Two times a day (BID) | ORAL | Status: DC

## 2024-10-06 MED ORDER — ACETAMINOPHEN 10 MG/ML IV SOLN
1000.0000 mg | Freq: Four times a day (QID) | INTRAVENOUS | Status: DC
  Administered 2024-10-06: 1000 mg via INTRAVENOUS
  Filled 2024-10-06 (×2): qty 100

## 2024-10-06 MED ORDER — VANCOMYCIN HCL 1 G IV SOLR
INTRAVENOUS | Status: DC | PRN
  Administered 2024-10-06: 1000 mg via TOPICAL

## 2024-10-06 MED ORDER — SODIUM CHLORIDE 0.9% FLUSH
10.0000 mL | Freq: Two times a day (BID) | INTRAVENOUS | Status: DC
  Administered 2024-10-07: 10 mL

## 2024-10-06 MED ORDER — LIDOCAINE HCL (CARDIAC) PF 100 MG/5ML IV SOSY
PREFILLED_SYRINGE | INTRAVENOUS | Status: DC | PRN
  Administered 2024-10-06: 80 mg via INTRAVENOUS

## 2024-10-06 MED ORDER — SURGIPHOR WOUND IRRIGATION SYSTEM - OPTIME: TOPICAL | Status: DC | PRN

## 2024-10-06 MED ORDER — FENTANYL CITRATE (PF) 100 MCG/2ML IJ SOLN
INTRAMUSCULAR | Status: DC | PRN
  Administered 2024-10-06: 50 ug via INTRAVENOUS
  Administered 2024-10-06 (×2): 25 ug via INTRAVENOUS

## 2024-10-06 MED ORDER — OXYCODONE HCL 5 MG PO TABS: 5.0000 mg | ORAL_TABLET | ORAL | Status: DC | PRN

## 2024-10-06 MED ORDER — ACETAMINOPHEN 10 MG/ML IV SOLN
INTRAVENOUS | Status: DC | PRN
  Administered 2024-10-06: 1000 mg via INTRAVENOUS

## 2024-10-06 MED ORDER — LIDOCAINE HCL (PF) 2 % IJ SOLN
INTRAMUSCULAR | Status: AC
  Filled 2024-10-06: qty 5

## 2024-10-06 MED ORDER — OXYCODONE HCL 5 MG PO TABS: 10.0000 mg | ORAL_TABLET | ORAL | Status: DC | PRN

## 2024-10-06 MED ORDER — FENTANYL CITRATE (PF) 100 MCG/2ML IJ SOLN: 25.0000 ug | INTRAMUSCULAR | Status: DC | PRN

## 2024-10-06 MED ORDER — BISACODYL 10 MG RE SUPP: 10.0000 mg | Freq: Every day | RECTAL | Status: DC | PRN

## 2024-10-06 MED ORDER — NOREPINEPHRINE 4 MG/250ML-% IV SOLN
INTRAVENOUS | Status: AC
  Filled 2024-10-06: qty 250

## 2024-10-06 MED ORDER — FENTANYL CITRATE (PF) 100 MCG/2ML IJ SOLN
INTRAMUSCULAR | Status: AC
  Filled 2024-10-06: qty 2

## 2024-10-06 MED ORDER — RINGERS IRRIGATION IR SOLN
Status: DC | PRN
  Administered 2024-10-06 (×4): 3000 mL

## 2024-10-06 MED ORDER — PHENOL 1.4 % MT LIQD
1.0000 | OROMUCOSAL | Status: DC | PRN
Start: 2024-10-06 — End: 2024-10-07

## 2024-10-06 MED ORDER — MENTHOL 3 MG MT LOZG
1.0000 | LOZENGE | OROMUCOSAL | Status: DC | PRN
Start: 2024-10-06 — End: 2024-10-07

## 2024-10-06 MED ORDER — FLEET ENEMA RE ENEM: 1.0000 | ENEMA | Freq: Once | RECTAL | Status: DC | PRN

## 2024-10-06 MED ORDER — ONDANSETRON HCL 4 MG PO TABS: 4.0000 mg | ORAL_TABLET | Freq: Four times a day (QID) | ORAL | Status: DC | PRN

## 2024-10-06 MED ORDER — SENNOSIDES-DOCUSATE SODIUM 8.6-50 MG PO TABS: 1.0000 | ORAL_TABLET | Freq: Two times a day (BID) | ORAL | Status: DC

## 2024-10-06 MED ORDER — FENTANYL CITRATE (PF) 50 MCG/ML IJ SOSY
PREFILLED_SYRINGE | INTRAMUSCULAR | Status: AC
  Administered 2024-10-06: 25 ug via INTRAVENOUS
  Filled 2024-10-06: qty 1

## 2024-10-06 MED ORDER — MIDAZOLAM HCL 2 MG/2ML IJ SOLN
INTRAMUSCULAR | Status: AC
  Filled 2024-10-06: qty 2

## 2024-10-06 MED ORDER — DROPERIDOL 2.5 MG/ML IJ SOLN: 0.6250 mg | Freq: Once | INTRAMUSCULAR | Status: DC | PRN

## 2024-10-06 MED ORDER — DEXAMETHASONE SOD PHOSPHATE PF 10 MG/ML IJ SOLN
INTRAMUSCULAR | Status: DC | PRN
  Administered 2024-10-06: 10 mg via INTRAVENOUS

## 2024-10-06 MED ORDER — SODIUM BICARBONATE 8.4 % IV SOLN
50.0000 meq | Freq: Once | INTRAVENOUS | Status: AC
  Administered 2024-10-06: 50 meq via INTRAVENOUS
  Filled 2024-10-06: qty 50

## 2024-10-06 SURGICAL SUPPLY — 31 items
ADAPTER IRRIG TUBE 2 SPIKE SOL (ADAPTER) ×2 IMPLANT
CUFF TRNQT CYL 34X4.125X (TOURNIQUET CUFF) IMPLANT
DRAPE ARTHROSCOPY W/POUCH 90 (DRAPES) ×1 IMPLANT
DURAPREP 26ML APPLICATOR (WOUND CARE) ×1 IMPLANT
GAUZE SPONGE 4X4 12PLY STRL (GAUZE/BANDAGES/DRESSINGS) ×1 IMPLANT
GAUZE XEROFORM 1X8 LF (GAUZE/BANDAGES/DRESSINGS) ×1 IMPLANT
GLOVE BIO SURGEON STRL SZ7.5 (GLOVE) ×1 IMPLANT
GLOVE BIOGEL PI IND STRL 8 (GLOVE) ×2 IMPLANT
GOWN STRL REUS W/ TWL LRG LVL3 (GOWN DISPOSABLE) ×2 IMPLANT
HANDPIECE VERSAJET DEBRIDEMENT (MISCELLANEOUS) IMPLANT
IV LR IRRIG 3000ML ARTHROMATIC (IV SOLUTION) ×2 IMPLANT
KIT TURNOVER KIT A (KITS) ×1 IMPLANT
MANIFOLD NEPTUNE II (INSTRUMENTS) ×1 IMPLANT
PACK ARTHROSCOPY KNEE (MISCELLANEOUS) ×1 IMPLANT
PACK TOTAL KNEE (MISCELLANEOUS) IMPLANT
PAD ABD DERMACEA PRESS 5X9 (GAUZE/BANDAGES/DRESSINGS) IMPLANT
PAD ARMBOARD POSITIONER FOAM (MISCELLANEOUS) ×1 IMPLANT
SOLUTION IRRIG SURGIPHOR (IV SOLUTION) IMPLANT
STOCKINETTE BIAS CUT 6 980064 (GAUZE/BANDAGES/DRESSINGS) ×1 IMPLANT
STOCKINETTE STRL BIAS CUT 8X4 (MISCELLANEOUS) IMPLANT
SUT MON AB 2-0 CT1 36 (SUTURE) IMPLANT
SUT MON AB-0 CT1 36 (SUTURE) IMPLANT
SUTURE EHLN 3-0 FS-10 30 BLK (SUTURE) ×1 IMPLANT
TIP FAN IRRIG PULSAVAC PLUS (DISPOSABLE) IMPLANT
TRAP FLUID SMOKE EVACUATOR (MISCELLANEOUS) ×1 IMPLANT
TRAY FOL W/BAG SLVR 16FR STRL (SET/KITS/TRAYS/PACK) IMPLANT
TUBE SET DOUBLEFLO INFLOW (TUBING) ×1 IMPLANT
TUBE SET DOUBLEFLO OUTFLOW (TUBING) ×1 IMPLANT
WAND HAND CNTRL MULTIVAC 50 (MISCELLANEOUS) ×1 IMPLANT
WATER STERILE IRR 500ML POUR (IV SOLUTION) ×1 IMPLANT
WRAP KNEE W/COLD PACKS 25.5X14 (SOFTGOODS) ×1 IMPLANT

## 2024-10-06 NOTE — Op Note (Signed)
 OPERATIVE NOTE  DATE OF SURGERY:  10/06/2024  PATIENT NAME:  Yolanda Harris   DOB: 09-08-1956  MRN: [id number]   PRE-OPERATIVE DIAGNOSIS: Infected left prepatellar/infrapatellar bursa  POST-OPERATIVE DIAGNOSIS:  Same  PROCEDURE: Incision, irrigation, and debridement of the left prepatellar/infrapatellar bursa  SURGEON:  Lynwood SHAUNNA Mardee Mickey., M.D.   ANESTHESIA: general  ESTIMATED BLOOD LOSS: 30 mL  FLUIDS REPLACED: 300 mL of crystalloid  TOURNIQUET TIME: 47 minutes  DRAINS: Prevena wound VAC  INDICATIONS FOR SURGERY: METTE SOUTHGATE is a 68 y.o. year old female who has been seen for complaints of cellulitis, swelling, and pain to the left knee.  Findings were consistent with infection/abscess of the left prepatellar and infrapatellar bursae. After discussion of the risks and benefits of surgical intervention, the patient expressed understanding of the risks benefits and agree with plans for incision, irrigation, and debridement of the left prepatellar and infrapatellar bursae.  PROCEDURE IN DETAIL: The patient was brought into the operating room and, to adequate general anesthesia was achieved, tourniquet was placed on the patient's upper left thigh.  Patient's left knee and leg were cleaned and prepped with Betadine and draped in the usual sterile fashion.  A timeout was performed as per usual protocol.  The left lower extremity was elevated and the tourniquet was inflated to 300 mmHg.  An anterior longitudinal incision was made.  Grossly purulent material was encountered.  Fluid was suctioned into a syringe and submitted for stat Gram stain, culture and sensitivity.  Sharp debridement was performed and tissue from the inferior and superior aspect of the wound was submitted for stat Gram stain, culture, and sensitivity.  The wound was debrided using a Versajet using approximately 1 mL liter of normal saline.  The wound was then irrigated with 3000 mL of normal saline using pulsatile  lavage.  Next, a dilute solution of 100 cc of 3% hydrogen peroxide and 100 cc of sterile water was used to soak the wound for approximately 2 minutes.  This was followed by irrigation using an additional 3000 cc of normal saline using pulsatile lavage.  500 cc of Surgiflo was then used to irrigate the wound and allowed to soak for 5 minutes.  Finally, the wound was irrigated with an additional 3000 cc of normal saline using pulsatile lavage.  The wound was packed with laps and the tourniquet was deflated after total tourniquet time of 47 minutes.  Hemostasis was achieved using electrocautery.  1 g of vancomycin powder was then distributed along the wound bed.  The incision was then closed in layers using first #0 Monocryl followed by #2-0 Monocryl.  The skin was closed with vertical mattress sutures of of #3-0 nylon.  A Prevena wound VAC was placed on the wound and activated.  A sterile dressing was then applied applied.  The patient tolerated procedure well.  She was transported to the recovery room in stable condition.   Adaleigh Warf P. Allizon Woznick, Jr. M.D.

## 2024-10-06 NOTE — Progress Notes (Signed)
 NAME:  KAMICA FLORANCE, MRN:  [id number], DOB:  September 15, 1956, LOS: 4 ADMISSION DATE:  10/01/2024, CONSULTATION DATE:  10/05/24 REFERRING MD:  Laree Lock, MD CHIEF COMPLAINT: LLE cellulitis, Hypotension   History of Present Illness:  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 She was referred as a direct admit for IV abx (rocephin + vancomycin -> linezolid) and imaging. ID consulted, guiding antibiotic therapy. US  LLE venous negative for DVT. Upon admission, she was febrile and had blood cultures drawn (11/11; NGTD). Orthopedics consulted, low suspicion of septic arthritis, risk of bacterial translocation outweighs benefit of arthrocentesis.   PCCM consulted 11/15 to evaluate for transfer iso hypotension (MAPs 50s). Found patient to be in Afib with RVR (off home beta blocker), receiving fluid boluses (2.5L total), transiently responsive. Lactate 3.5-3.9, non-anion gap metabolic acidosis. Electrolyte abnormalities include mild hyponatremia, hypocalcemia iso AKI (sCr baseline <1.0).   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9 % 100 mL IVPB        2 g 200 mL/hr over 30 Minutes Intravenous Every 24 hours 10/01/24 2336     10/01/24 2200  vancomycin (VANCOREADY) IVPB 2000 mg/400 mL        2,000 mg 200 mL/hr over 120 Minutes Intravenous  Once 10/01/24 2147 10/02/24 0115   10/01/24 2145  cefTRIAXone (ROCEPHIN) 2 g in sodium chloride 0.9 % 100 mL IVPB        2 g 200 mL/hr over 30 Minutes Intravenous  Once 10/01/24 2138 10/01/24 2231       Significant Hospital Events: Including procedures, antibiotic start and stop dates in addition to other pertinent events   11/15: admit from wound clinic for worsening cellulitis LLE. Transfer to ICU for septic shock, afib with RVR 11/16: Pt remains on amiodarone  gtt @30  mg/hr, however remains in atrial fibrillation hr 130 to 140's.  Remains on levophed gtt @8  mcg/min to maintain map >65.  Orthopedic consulted pt pending I&D of the left knee   Interim History / Subjective:  As outlined above under significant events   Objective    Blood pressure (!) 99/43, pulse (!) 139, temperature 98.2 F (36.8 C), temperature source Oral, resp. rate (!) 24, SpO2 97%.        Intake/Output Summary (Last 24 hours) at 10/06/2024 1118  Last data filed at 10/06/2024 0700 Gross per 24 hour  Intake 2319.23 ml  Output 800 ml  Net 1519.23 ml   There were no vitals filed for this visit.  Examination: General: Acutely-ill appearing female, NAD on RA  HENT: Supple, no JVD  Lungs: Clear throughout, even, non labored  Cardiovascular: Irregular irregular, no m/r/g, 2+ radial/1+ distal pulses, Abdomen: +BS x4, soft, obese, non tender, non distended   Extremities: Left LE with ace wrap, knee erythematous, edematous, boggy and hot to touch Neuro: Alert and oriented, following commands, PERRLA  GU: External catheter   Resolved problem list   Assessment and Plan   #Acute pain  - Prn flexeril  and morphine  for pain management   #Distributive shock #PAF with RVR #Mildly elevated troponin likely secondary to demand ischemia Hx: HTN and HLD  - Continuous telemetry monitoring  - Trend troponin until peaked  - Continue amiodarone  gtt  - IV fluid resuscitation and prn levophed gtt to maintain map >65; once PICC line placed will start vasopressin gtt and wean levophed gtt due to continue atrial fibrillation with rvr  - Hold outpatient antihypertensives and diuretic for now   #Acute kidney injury secondary to ATN  #Anion gap metabolic acidosis  #Lactic acidosis  #Hyponatremia  - Trend BMP and lactic acid - VBG pending  - Replace electrolytes as indicated  - Strict I&O's - Avoid nephrotoxic agents as able   #Transaminitis #Elevated INR #Elevate alk phos  - Trend hepatic function panel  - Trend hepatotoxic agents as able  - Likely related to poor perfusion, US  Liver results pending   #Sepsis  #Left lower extremity cellulitis  #Left knee effusion~gram stain positive with gram positive cocci in pairs  - Trend WBC and monitor fever curve  - Follow cultures - Continue abx as outline above pending culture results and sensitivities  - Ortho consulted appreciate input: pt pending left lower extremity I&D  #Anemia without obvious signs of bleeding  - Trend CBC  - Monitor for s/sx of bleeding  - Transfuse for hgb <7  #Type II diabetes mellitus  - CBG's ac/hs  - SSI  - Follow hypo/hyperglycemic protocol  - Target CBG readings 140 to 180   Labs   CBC: Recent Labs  Lab 10/02/24 0411 10/03/24 0605 10/04/24 0317 10/05/24 0612 10/06/24 0640  WBC 12.6* 11.7* 10.6* 10.3 13.3*  HGB 11.1* 11.3* 11.1* 10.7* 10.9*  HCT 33.5*  32.9* 32.5* 30.6* 31.2*  MCV 92.3 89.2 89.0 86.4 86.2  PLT 50* 58* 66* 77* 90*    Basic Metabolic Panel: Recent Labs  Lab 10/02/24 0411 10/03/24 0605 10/04/24 0317 10/05/24 0612 10/05/24 1517 10/06/24 0312  NA 139 136 133* 132*  --  130*  K 3.2* 4.2 4.4 4.2  --  5.0  CL 110 110 107 107  --  101  CO2 19* 18* 18* 16*  --  14*  GLUCOSE 206* 145* 185* 146*  --  242*  BUN 14 14 15 18   --  20  CREATININE 1.20* 1.15* 1.23* 1.66*  --  2.50*  CALCIUM  7.9* 8.0* 7.9* 7.7*  --  7.8*  MG 2.3 2.2  --   --  2.1 2.2   GFR: Estimated Creatinine Clearance: 26 mL/min (A) (by C-G formula based on SCr of 2.5 mg/dL (H)). Recent Labs  Lab 10/02/24 0411 10/02/24 0618 10/03/24 0605 10/04/24 0317 10/05/24 0612 10/05/24 1250 10/05/24 1517 10/06/24 0640  WBC 12.6*  --  11.7* 10.6* 10.3  --   --  13.3*  LATICACIDVEN 1.8 1.4  --   --   --  3.9* 3.5*  --     Liver Function Tests: Recent Labs  Lab 10/05/24 1517 10/06/24 0312  AST 665* 870*  ALT 168* 222*  ALKPHOS 190* 239*  BILITOT 2.9* 3.9*  PROT 5.6* 5.9*  ALBUMIN 2.4* 2.4*   No results for input(s): LIPASE, AMYLASE in the last 168 hours. No results for input(s): AMMONIA in the last 168 hours.  ABG    Component Value Date/Time   HCO3 18.9 (L) 10/05/2024 2022   ACIDBASEDEF 6.1 (H) 10/05/2024 2022   O2SAT 61.1 10/05/2024 2022     Coagulation Profile: Recent Labs  Lab 10/02/24 0224 10/05/24 2020  INR 1.7* 1.9*    Cardiac Enzymes: No results for input(s): CKTOTAL, CKMB, CKMBINDEX, TROPONINI in the last 168 hours.  HbA1C: Hgb A1c MFr Bld  Date/Time Value Ref Range Status  07/19/2024 02:58 PM 7.1 (H) 4.8 - 5.6 % Final    Comment:             Prediabetes: 5.7 - 6.4          Diabetes: >6.4          Glycemic control for adults with diabetes: <7.0   04/18/2024 02:58 PM 7.8 (H) 4.8 - 5.6 % Final    Comment:             Prediabetes: 5.7 - 6.4          Diabetes: >6.4          Glycemic control for adults with  diabetes: <7.0     CBG: Recent Labs  Lab 10/05/24 0804 10/05/24 1140 10/05/24 1541 10/05/24 2107 10/06/24 0814  GLUCAP 142* 218* 184* 184* 189*    Review of Systems: Positives in BOLD   Gen: Denies fever, chills, weight change, fatigue, night sweats HEENT: Denies blurred vision, double vision, hearing loss, tinnitus, sinus congestion, rhinorrhea, sore throat, neck stiffness, dysphagia PULM: Denies shortness of breath, cough, sputum production, hemoptysis, wheezing CV: Denies chest pain, edema, orthopnea, paroxysmal nocturnal dyspnea, palpitations GI: Denies abdominal pain, nausea, vomiting, diarrhea, hematochezia, melena, constipation, change in bowel habits GU: Denies dysuria, hematuria, polyuria, oliguria, urethral discharge Endocrine: Denies hot or cold intolerance, polyuria, polyphagia or appetite change Muscu: chronic neck pain, left knee pain  Derm: Denies rash, dry skin, scaling or peeling skin change Heme: Denies easy bruising, bleeding, bleeding gums Neuro: Denies headache, numbness, weakness, slurred speech, loss of memory or consciousness  Past Medical History:  She,  has a past medical history of Diabetes mellitus without complication (HCC), HLD (hyperlipidemia), Hypertension, PAF (paroxysmal atrial fibrillation) (HCC), Sciatic leg pain, and Thrombocytopenia.   Surgical History:   Past Surgical History:  Procedure Laterality Date   ABDOMINAL HYSTERECTOMY     HTN       Social History:   reports that she has quit smoking. Her smoking use included cigarettes. She has never used smokeless tobacco. She reports that she does not currently use alcohol. She reports that she does not use drugs.   Family History:  Her family history is negative for Breast cancer.   Allergies Allergies  Allergen Reactions   Amiodarone  Shortness Of Breath   Sulfa Antibiotics Rash     Home Medications  Prior to Admission medications   Medication Sig Start Date End Date Taking?  Authorizing Provider  amitriptyline (ELAVIL) 25 MG tablet TAKE 1 TABLET EVERY MORNING 01/09/24  Yes Tejan-Sie, S Ahmed, MD  amLODipine -benazepril  (LOTREL) 5-20  MG capsule Take 1 capsule by mouth daily. 07/30/24  Yes Albina GORMAN Dine, MD  atorvastatin  (LIPITOR) 10 MG tablet TAKE 1 TABLET EVERY EVENING 03/25/24  Yes Tejan-Sie, S Ahmed, MD  cyclobenzaprine  (FLEXERIL ) 10 MG tablet Take 1 tablet (10 mg total) by mouth 3 (three) times daily. 07/30/24  Yes Albina GORMAN Dine, MD  empagliflozin (JARDIANCE) 25 MG TABS tablet Take 25 mg by mouth daily. 09/16/24  Yes [provider]  furosemide  (LASIX ) 40 MG tablet Take 1 tablet (40 mg total) by mouth daily. 07/30/24 07/30/25 Yes Tejan-Sie, GORMAN Dine, MD  meloxicam  (MOBIC ) 15 MG tablet TAKE 1 TABLET EVERY DAY 03/25/24  Yes Tejan-Sie, S Ahmed, MD  metoprolol  tartrate (LOPRESSOR ) 25 MG tablet TAKE 1 TABLET TWICE DAILY 06/03/24  Yes Tejan-Sie, S Ahmed, MD  olmesartan (BENICAR) 40 MG tablet Take 1 tablet (40 mg total) by mouth daily. 09/06/24 10/06/24 Yes Albina GORMAN Dine, MD  pregabalin  (LYRICA ) 300 MG capsule Take 1 capsule (300 mg total) by mouth 2 (two) times daily. 07/29/24 01/25/25 Yes Albina GORMAN Dine, MD  rosuvastatin (CRESTOR) 20 MG tablet Take 20 mg by mouth daily.   Yes [provider]  Semaglutide , 2 MG/DOSE, (OZEMPIC , 2 MG/DOSE,) 8 MG/3ML SOPN Inject 2 mg into the skin once a week. 07/19/24 10/11/24 Yes Albina GORMAN Dine, MD  triamcinolone cream (KENALOG) 0.1 % Apply 1 Application topically 2 (two) times daily.   Yes [provider]  Alcohol Swabs (DROPSAFE ALCOHOL PREP) 70 % PADS USE TO CLEAN FINGER BEFORE STICKING TO CHECK SUGAR AS DIRECTED 06/03/24   Albina GORMAN Dine, MD  amiodarone  (PACERONE ) 200 MG tablet Take 2 tablets by mouth 2 (two) times daily. Patient not taking: Reported on 09/27/2024    [provider]  cetirizine  (ZYRTEC  ALLERGY) 10 MG tablet Take 1 tablet (10 mg total) by mouth daily. Patient not taking: Reported  on 10/02/2024 07/29/24 10/27/24  Albina GORMAN Dine, MD  fluticasone  (FLONASE ) 50 MCG/ACT nasal spray Place 1 spray into both nostrils daily. Patient not taking: Reported on 10/02/2024 07/08/24 10/06/24  Albina GORMAN Dine, MD  furosemide  (LASIX ) 40 MG tablet Take 1 tablet (40 mg total) by mouth 2 (two) times daily for 14 days. 09/06/24 09/27/24  Albina GORMAN Dine, MD  glucose blood (RELION TRUE METRIX TEST STRIPS) test strip Use with device to check sugars  up to three times daily 01/05/24   Albina GORMAN Dine, MD  pioglitazone  (ACTOS ) 45 MG tablet Take 1 tablet (45 mg total) by mouth every morning. Patient not taking: Reported on 10/02/2024 07/19/24   Albina GORMAN Dine, MD  TRESIBA  FLEXTOUCH 200 UNIT/ML FlexTouch Pen INJECT 96 UNITS SUBCUTANEOUSLY ONCE DAILY Patient not taking: Reported on 10/02/2024 08/16/24   Albina GORMAN Dine, MD  TRUEplus Lancets 33G MISC TEST BLOOD SUGAR UP TO THREE TIMES DAILY 06/03/24   Tejan-Sie, S Ahmed, MD  Zinc  Oxide 15 % CREA Apply to left leg wound bid Patient not taking: No sig reported 02/13/23   Albina GORMAN Dine, MD     Critical care time: 40 min    Lonell Moose, AGNP  Pulmonary/Critical Care Pager [phone] (please enter 7 digits) PCCM Consult Pager [phone] (please enter 7 digits)

## 2024-10-06 NOTE — Anesthesia Preprocedure Evaluation (Signed)
 Anesthesia Evaluation  Patient identified by MRN, date of birth, ID band Patient awake    Reviewed: Allergy & Precautions, H&P , NPO status , Patient's Chart, lab work & pertinent test results, reviewed documented beta blocker date and time   History of Anesthesia Complications Negative for: history of anesthetic complications  Airway Mallampati: III  TM Distance: >3 FB Neck ROM: full    Dental  (+) Edentulous Upper, Edentulous Lower   Pulmonary neg pulmonary ROS, former smoker   Pulmonary exam normal breath sounds clear to auscultation       Cardiovascular Exercise Tolerance: Good hypertension, (-) angina (-) Past MI and (-) Cardiac Stents + dysrhythmias Atrial Fibrillation (-) Valvular Problems/Murmurs Rhythm:regular Rate:Normal     Neuro/Psych  PSYCHIATRIC DISORDERS  Depression    negative neurological ROS     GI/Hepatic negative GI ROS, Neg liver ROS,,,  Endo/Other  diabetes    Renal/GU ARFRenal disease  negative genitourinary   Musculoskeletal   Abdominal   Peds  Hematology negative hematology ROS (+)   Anesthesia Other Findings Past Medical History: No date: Diabetes mellitus without complication (HCC) No date: HLD (hyperlipidemia) No date: Hypertension No date: PAF (paroxysmal atrial fibrillation) (HCC) No date: Sciatic leg pain No date: Thrombocytopenia   Reproductive/Obstetrics negative OB ROS                              Anesthesia Physical Anesthesia Plan  ASA: 3  Anesthesia Plan: General   Post-op Pain Management:    Induction: Intravenous  PONV Risk Score and Plan: 3  Airway Management Planned: LMA and Oral ETT  Additional Equipment:   Intra-op Plan:   Post-operative Plan: Extubation in OR  Informed Consent: I have reviewed the patients History and Physical, chart, labs and discussed the procedure including the risks, benefits and alternatives for the  proposed anesthesia with the patient or authorized representative who has indicated his/her understanding and acceptance.     Dental Advisory Given  Plan Discussed with: Anesthesiologist, CRNA and Surgeon  Anesthesia Plan Comments:         Anesthesia Quick Evaluation

## 2024-10-06 NOTE — Progress Notes (Signed)
 Patient unable to void this shift. Bladder scan results at 2300 showed 455 ml of urine in bladder; In/out cath performed and 800 ml of amber colored urine obtained. Patient continues to not feel the urge to void this morning; bladder scan showing 202 ml of urine this morning. Patient denies the need/urge to void. Patient instructed to call if she feels like she needs to void otherwise bladder scan can be repeated if she is unable to void. Patient and daughter verbalized understanding.

## 2024-10-06 NOTE — Progress Notes (Signed)
 Peripherally Inserted Central Catheter Placement  The IV Nurse has discussed with the patient and/or persons authorized to consent for the patient, the purpose of this procedure and the potential benefits and risks involved with this procedure.  The benefits include less needle sticks, lab draws from the catheter, and the patient may be discharged home with the catheter. Risks include, but not limited to, infection, bleeding, blood clot (thrombus formation), and puncture of an artery; nerve damage and irregular heartbeat and possibility to perform a PICC exchange if needed/ordered by physician.  Alternatives to this procedure were also discussed.  Bard Power PICC patient education guide, fact sheet on infection prevention and patient information card has been provided to patient /or left at bedside. Obtained consent from patient's husband at the waiting room area.   PICC Placement Documentation  PICC Triple Lumen 10/06/24 Right Brachial 40 cm 0 cm (Active)  Indication for Insertion or Continuance of Line Vasoactive infusions 10/06/24 1453  Exposed Catheter (cm) 0 cm 10/06/24 1453  Site Assessment Clean, Dry, Intact 10/06/24 1453  Lumen #1 Status Flushed;Saline locked;Blood return noted 10/06/24 1453  Lumen #2 Status Flushed;Saline locked;Blood return noted 10/06/24 1453  Lumen #3 Status Flushed;Saline locked;Blood return noted 10/06/24 1453  Dressing Type Transparent;Securing device 10/06/24 1453  Dressing Status Antimicrobial disc/dressing in place;Clean, Dry, Intact 10/06/24 1453  Line Care Connections checked and tightened 10/06/24 1453  Line Adjustment (NICU/IV Team Only) No 10/06/24 1453  Dressing Intervention New dressing 10/06/24 1453  Dressing Change Due 10/13/24 10/06/24 1453       Ashleyann Shoun Sheral Ruth 10/06/2024, 2:55 PM

## 2024-10-06 NOTE — Consult Note (Signed)
 ORTHOPAEDICS: OR still not available. Our case has been bumped by 2 additional cases. I informed the patient and nursing of the delay.  Patient still on vasopressors. A central line has been ordered,  Here appears to be new drainage along the proximal aspect of the lower leg wrap. Depending upon the delay, I may take down the dressing and evaluate for any new lesions.  Krystale Rinkenberger P. Lynzi Meulemans, Jr. M.D.

## 2024-10-06 NOTE — Care Management Important Message (Signed)
 Important Message  Patient Details  Name: Yolanda Harris MRN: [id number] Date of Birth: 12/01/1955   Important Message Given:  Yes - Medicare IM     Rojelio [name] 10/06/2024, 6:04 PM

## 2024-10-06 NOTE — Anesthesia Procedure Notes (Signed)
 Procedure Name: LMA Insertion Date/Time: 10/06/2024 5:39 PM  Performed by: Delores Evalene BROCKS, CRNAPre-anesthesia Checklist: Patient identified, Patient being monitored, Timeout performed, Emergency Drugs available and Suction available Patient Re-evaluated:Patient Re-evaluated prior to induction Oxygen Delivery Method: Circle system utilized Preoxygenation: Pre-oxygenation with 100% oxygen Induction Type: IV induction Ventilation: Mask ventilation without difficulty LMA: LMA inserted LMA Size: 4.0 Tube type: Oral Number of attempts: 1 Placement Confirmation: positive ETCO2 and breath sounds checked- equal and bilateral Tube secured with: Tape Dental Injury: Teeth and Oropharynx as per pre-operative assessment  Comments: Attempted size 4 igel. Would not seat. Aurostraight LMA size 4 seated

## 2024-10-06 NOTE — Transfer of Care (Signed)
 Immediate Anesthesia Transfer of Care Note  Patient: Yolanda Harris  Procedure(s) Performed: IRRIGATION AND DEBRIDEMENT KNEE (Left: Knee)  Patient Location: PACU  Anesthesia Type:General  Level of Consciousness: drowsy  Airway & Oxygen Therapy: Patient Spontanous Breathing and Patient connected to face mask oxygen  Post-op Assessment: Report given to RN, Post -op Vital signs reviewed and stable, and Patient moving all extremities  Post vital signs: Reviewed and stable  Last Vitals:  Vitals Value Taken Time  BP 90/44 10/06/24 20:20  Temp    Pulse 120 10/06/24 20:24  Resp 16 10/06/24 20:24  SpO2 99 % 10/06/24 20:24  Vitals shown include unfiled device data.  Last Pain:  Vitals:   10/06/24 1545  TempSrc: Oral  PainSc:       Patients Stated Pain Goal: 0 (10/06/24 0907)  Complications: No notable events documented.

## 2024-10-06 NOTE — Consult Note (Signed)
 ORTHOPAEDICS: Proximal dressing taken down, revealing 2 anterior draining sinuses. Grossly purulent fluid was expressed from the prepatellar and infrapatellar superficial region. Difficult to discern if there is actually a knee effusion.  I discussed the findings with the patient and her family. Given these findings, I recommend we modify the plan to incision, irrigation, and debridement of the prepatellar and infrapatellar bursae, possible arthrotomy. They expressed their understanding and were in agreement.  Yolanda Harris P. Ayris Carano, Jr. M.D.

## 2024-10-06 NOTE — Plan of Care (Addendum)
 Patient transferred from 1A, oriented to self, place, and situation; disoriented to month/year. Patient fell on left knee at home, admitted to 1A and transferred to ICU due to hypotension and elevated HR. Continues to require Levophed, Amiodarone  infusing per order, tolerating thus far. Patient is NPO for procedures this morning. Daughter is at bedside. Dressing change completed and LDA parameters added to chart under wound.  Problem: Education: Goal: Ability to describe self-care measures that may prevent or decrease complications (Diabetes Survival Skills Education) will improve Outcome: Progressing   Problem: Coping: Goal: Ability to adjust to condition or change in health will improve Outcome: Progressing   Problem: Fluid Volume: Goal: Ability to maintain a balanced intake and output will improve Outcome: Progressing   Problem: Metabolic: Goal: Ability to maintain appropriate glucose levels will improve Outcome: Progressing   Problem: Nutritional: Goal: Maintenance of adequate nutrition will improve Outcome: Progressing   Problem: Clinical Measurements: Goal: Ability to maintain clinical measurements within normal limits will improve Outcome: Progressing Goal: Diagnostic test results will improve Outcome: Progressing Goal: Respiratory complications will improve Outcome: Progressing Goal: Cardiovascular complication will be avoided Outcome: Progressing   Problem: Pain Managment: Goal: General experience of comfort will improve and/or be controlled Outcome: Progressing

## 2024-10-06 NOTE — Consult Note (Signed)
 ORTHOPAEDICS PROGRESS NOTE  PATIENT NAME: Yolanda Harris DOB: [birth year]  MRN: [id number]  Subjective: The patient is awake and alert.  Her daughter is at bedside. The patient denies any significant pain.  She did have an episode of emesis this morning.  Objective: Vital signs in last 24 hours: Temp:  [97.8 F (36.6 C)-99.5 F (37.5 C)] 98.2 F (36.8 C) (11/16 0600) Pulse Rate:  [50-140] 131 (11/16 0715) Resp:  [15-27] 21 (11/16 0715) BP: (61-143)/(36-94) 91/45 (11/16 0715) SpO2:  [94 %-100 %] 94 % (11/16 0715)  Intake/Output from previous day: 11/15 0701 - 11/16 0700 In: 2319.2 [I.V.:658.8; IV Piggyback:1660.5] Out: 800 [Urine:800]  Recent Labs    10/04/24 0317 10/05/24 0612 10/05/24 2020 10/06/24 0312 10/06/24 0640  WBC 10.6* 10.3  --   --  13.3*  HGB 11.1* 10.7*  --   --  10.9*  HCT 32.5* 30.6*  --   --  31.2*  PLT 66* 77*  --   --  90*  K 4.4 4.2  --  5.0  --   CL 107 107  --  101  --   CO2 18* 16*  --  14*  --   BUN 15 18  --  20  --   CREATININE 1.23* 1.66*  --  2.50*  --   GLUCOSE 185* 146*  --  242*  --   CALCIUM  7.9* 7.7*  --  7.8*  --   INR  --   --  1.9*  --   --     EXAM General: Well-developed well-nourished female seen in mild discomfort. Left lower extremity: The lower leg is wrapped in gauze and Ace wrap.  There appears to be decreased swelling when compared to previous images.  The left knee is warm to touch.  There does appear to be an effusion.  Gentle range of motion is well-tolerated.  The knee is stable to varus and valgus stress. Neurologic: Awake, alert, and oriented.  Sensory and motor function are intact.  Assessment: Septic left knee Cellulitis of the left lower extremity  Plan: The patient is still being maintained on vasopressors.  She is in A-fib and demonstrates tachycardia. I discussed the risks and benefits of arthroscopic irrigation debridement of the left knee with the patient and her daughter.  The possible need for  subsequent procedures was also discussed.  They expressed her understanding of the risk and benefits and agree with plans for surgical intervention.  I have discussed the patient's status with Anesthesiology.  They will communicate with the intensivist but anticipate being able to proceed with the procedure when an OR becomes available.  Nehemiah Mcfarren P. Trevaun Rendleman, Jr. M.D.

## 2024-10-07 ENCOUNTER — Encounter: Payer: Self-pay | Admitting: Orthopedic Surgery

## 2024-10-07 ENCOUNTER — Inpatient Hospital Stay

## 2024-10-07 ENCOUNTER — Encounter: Admitting: Physician Assistant

## 2024-10-07 ENCOUNTER — Encounter: Payer: Self-pay | Admitting: *Deleted

## 2024-10-07 DIAGNOSIS — Z452 Encounter for adjustment and management of vascular access device: Secondary | ICD-10-CM | POA: Diagnosis not present

## 2024-10-07 DIAGNOSIS — E8721 Acute metabolic acidosis: Secondary | ICD-10-CM

## 2024-10-07 DIAGNOSIS — R6521 Severe sepsis with septic shock: Secondary | ICD-10-CM | POA: Diagnosis not present

## 2024-10-07 DIAGNOSIS — L03116 Cellulitis of left lower limb: Secondary | ICD-10-CM | POA: Diagnosis not present

## 2024-10-07 DIAGNOSIS — Z4682 Encounter for fitting and adjustment of non-vascular catheter: Secondary | ICD-10-CM | POA: Diagnosis not present

## 2024-10-07 DIAGNOSIS — J9600 Acute respiratory failure, unspecified whether with hypoxia or hypercapnia: Secondary | ICD-10-CM

## 2024-10-07 DIAGNOSIS — G929 Unspecified toxic encephalopathy: Secondary | ICD-10-CM

## 2024-10-07 DIAGNOSIS — A419 Sepsis, unspecified organism: Secondary | ICD-10-CM | POA: Diagnosis not present

## 2024-10-07 DIAGNOSIS — R0989 Other specified symptoms and signs involving the circulatory and respiratory systems: Secondary | ICD-10-CM | POA: Diagnosis not present

## 2024-10-07 DIAGNOSIS — R918 Other nonspecific abnormal finding of lung field: Secondary | ICD-10-CM | POA: Diagnosis not present

## 2024-10-07 LAB — ECHOCARDIOGRAM COMPLETE
AR max vel: 2.19 cm2
AV Peak grad: 12.3 mmHg
Ao pk vel: 1.75 m/s
S' Lateral: 2.8 cm

## 2024-10-07 LAB — COMPREHENSIVE METABOLIC PANEL WITH GFR
ALT: 232 U/L — ABNORMAL HIGH (ref 0–44)
AST: 942 U/L — ABNORMAL HIGH (ref 15–41)
Albumin: 2.5 g/dL — ABNORMAL LOW (ref 3.5–5.0)
Alkaline Phosphatase: 423 U/L — ABNORMAL HIGH (ref 38–126)
Anion gap: 30 — ABNORMAL HIGH (ref 5–15)
BUN: 25 mg/dL — ABNORMAL HIGH (ref 8–23)
CO2: 8 mmol/L — ABNORMAL LOW (ref 22–32)
Calcium: 7.1 mg/dL — ABNORMAL LOW (ref 8.9–10.3)
Chloride: 94 mmol/L — ABNORMAL LOW (ref 98–111)
Creatinine, Ser: 3.79 mg/dL — ABNORMAL HIGH (ref 0.44–1.00)
GFR, Estimated: 12 mL/min — ABNORMAL LOW (ref >60)
Glucose, Bld: 266 mg/dL — ABNORMAL HIGH (ref 70–99)
Potassium: 5.4 mmol/L — ABNORMAL HIGH (ref 3.5–5.1)
Sodium: 131 mmol/L — ABNORMAL LOW (ref 135–145)
Total Bilirubin: 4.7 mg/dL — ABNORMAL HIGH (ref 0.0–1.2)
Total Protein: 6.2 g/dL — ABNORMAL LOW (ref 6.5–8.1)

## 2024-10-07 LAB — CBC
HCT: 29.2 % — ABNORMAL LOW (ref 36.0–46.0)
Hemoglobin: 8.8 g/dL — ABNORMAL LOW (ref 12.0–15.0)
MCH: 30.4 pg (ref 26.0–34.0)
MCHC: 30.1 g/dL (ref 30.0–36.0)
MCV: 101 fL — ABNORMAL HIGH (ref 80.0–100.0)
Platelets: 102 K/uL — ABNORMAL LOW (ref 150–400)
RBC: 2.89 MIL/uL — ABNORMAL LOW (ref 3.87–5.11)
RDW: 19.9 % — ABNORMAL HIGH (ref 11.5–15.5)
WBC: 15.3 K/uL — ABNORMAL HIGH (ref 4.0–10.5)
nRBC: 0.6 % — ABNORMAL HIGH (ref 0.0–0.2)

## 2024-10-07 LAB — BLOOD GAS, ARTERIAL
Acid-base deficit: 21 mmol/L — ABNORMAL HIGH (ref 0.0–2.0)
Bicarbonate: 7.8 mmol/L — ABNORMAL LOW (ref 20.0–28.0)
O2 Saturation: 97.8 %
Patient temperature: 37
pCO2 arterial: 27 mmHg — ABNORMAL LOW (ref 32–48)
pH, Arterial: 7.07 — CL (ref 7.35–7.45)
pO2, Arterial: 87 mmHg (ref 83–108)

## 2024-10-07 LAB — HEPATIC FUNCTION PANEL
ALT: 175 U/L — ABNORMAL HIGH (ref 0–44)
AST: 691 U/L — ABNORMAL HIGH (ref 15–41)
Albumin: 2 g/dL — ABNORMAL LOW (ref 3.5–5.0)
Alkaline Phosphatase: 237 U/L — ABNORMAL HIGH (ref 38–126)
Bilirubin, Direct: 2.9 mg/dL — ABNORMAL HIGH (ref 0.0–0.2)
Indirect Bilirubin: 0.8 mg/dL (ref 0.3–0.9)
Total Bilirubin: 3.7 mg/dL — ABNORMAL HIGH (ref 0.0–1.2)
Total Protein: 5 g/dL — ABNORMAL LOW (ref 6.5–8.1)

## 2024-10-07 LAB — UREA NITROGEN, URINE: Urea Nitrogen, Ur: 309 mg/dL

## 2024-10-07 LAB — BETA-HYDROXYBUTYRIC ACID: Beta-Hydroxybutyric Acid: 0.27 mmol/L (ref 0.05–0.27)

## 2024-10-07 LAB — PROTIME-INR
INR: 2.5 — ABNORMAL HIGH (ref 0.8–1.2)
Prothrombin Time: 27.9 s — ABNORMAL HIGH (ref 11.4–15.2)

## 2024-10-07 LAB — ACETAMINOPHEN LEVEL: Acetaminophen (Tylenol), Serum: 19 ug/mL (ref 10–30)

## 2024-10-07 LAB — LACTIC ACID, PLASMA
Lactic Acid, Venous: >9 mmol/L (ref 0.5–1.9)
Lactic Acid, Venous: >9 mmol/L (ref 0.5–1.9)

## 2024-10-07 LAB — AMMONIA: Ammonia: 89 umol/L — ABNORMAL HIGH (ref 9–35)

## 2024-10-07 LAB — GLUCOSE, CAPILLARY: Glucose-Capillary: 221 mg/dL — ABNORMAL HIGH (ref 70–99)

## 2024-10-07 LAB — MAGNESIUM: Magnesium: 2 mg/dL (ref 1.7–2.4)

## 2024-10-07 MED ORDER — STERILE WATER FOR INJECTION IV SOLN
INTRAVENOUS | Status: DC
  Filled 2024-10-07 (×3): qty 1000

## 2024-10-07 MED ORDER — SODIUM BICARBONATE 8.4 % IV SOLN
INTRAVENOUS | Status: AC
  Administered 2024-10-07: 100 meq via INTRAVENOUS
  Filled 2024-10-07: qty 100

## 2024-10-07 MED ORDER — EPINEPHRINE 1 MG/10ML IV SOSY
PREFILLED_SYRINGE | INTRAVENOUS | Status: AC
Start: 2024-10-07 — End: 2024-10-07
  Administered 2024-10-07: 1 mg
  Filled 2024-10-07: qty 10

## 2024-10-07 MED ORDER — MIDAZOLAM HCL (PF) 2 MG/2ML IJ SOLN: 2.0000 mg | INTRAMUSCULAR | Status: DC | PRN

## 2024-10-07 MED ORDER — PHENYLEPHRINE HCL-NACL 20-0.9 MG/250ML-% IV SOLN: 25.0000 ug/min | INTRAVENOUS | Status: DC

## 2024-10-07 MED ORDER — MORPHINE SULFATE (PF) 2 MG/ML IV SOLN
2.0000 mg | INTRAVENOUS | Status: DC | PRN
  Filled 2024-10-07: qty 2

## 2024-10-07 MED ORDER — VASOPRESSIN 20 UNITS/100 ML INFUSION FOR SHOCK
0.0000 [IU]/min | INTRAVENOUS | Status: DC
Start: 2024-10-07 — End: 2024-10-07
  Administered 2024-10-07: 0.03 [IU]/min via INTRAVENOUS
  Filled 2024-10-07: qty 100

## 2024-10-07 MED ORDER — EPINEPHRINE HCL 5 MG/250ML IV SOLN IN NS
0.5000 ug/min | INTRAVENOUS | Status: DC
  Administered 2024-10-07: 0.5 ug/min via INTRAVENOUS
  Administered 2024-10-07: 20 ug/min via INTRAVENOUS
  Filled 2024-10-07 (×2): qty 250

## 2024-10-07 MED ORDER — GLYCOPYRROLATE 0.2 MG/ML IJ SOLN: 0.2000 mg | INTRAMUSCULAR | Status: DC | PRN

## 2024-10-07 MED ORDER — PANTOPRAZOLE SODIUM 40 MG IV SOLR
40.0000 mg | Freq: Two times a day (BID) | INTRAVENOUS | Status: DC
  Administered 2024-10-07: 40 mg via INTRAVENOUS
  Filled 2024-10-07: qty 10

## 2024-10-07 MED ORDER — SODIUM CHLORIDE 0.9 % IV SOLN: INTRAVENOUS | Status: DC

## 2024-10-07 MED ORDER — NOREPINEPHRINE 16 MG/250ML-% IV SOLN
0.0000 ug/min | INTRAVENOUS | Status: DC
  Administered 2024-10-07: 40 ug/min via INTRAVENOUS
  Filled 2024-10-07: qty 250

## 2024-10-07 MED ORDER — SODIUM BICARBONATE 8.4 % IV SOLN
INTRAVENOUS | Status: AC
Start: 2024-10-07 — End: 2024-10-07
  Administered 2024-10-07: 50 meq
  Filled 2024-10-07: qty 50

## 2024-10-07 MED ORDER — ETOMIDATE 2 MG/ML IV SOLN
10.0000 mg | Freq: Once | INTRAVENOUS | Status: AC
  Administered 2024-10-07: 10 mg via INTRAVENOUS
  Filled 2024-10-07: qty 10

## 2024-10-07 MED ORDER — ACETAMINOPHEN 650 MG RE SUPP: 650.0000 mg | Freq: Four times a day (QID) | RECTAL | Status: DC | PRN

## 2024-10-07 MED ORDER — SODIUM BICARBONATE 8.4 % IV SOLN: 100.0000 meq | Freq: Once | INTRAVENOUS | Status: AC

## 2024-10-07 MED ORDER — HALOPERIDOL LACTATE 5 MG/ML IJ SOLN: 2.5000 mg | INTRAMUSCULAR | Status: DC | PRN

## 2024-10-07 MED ORDER — CHLORHEXIDINE GLUCONATE CLOTH 2 % EX PADS: 6.0000 | MEDICATED_PAD | Freq: Every day | CUTANEOUS | Status: DC

## 2024-10-07 MED ORDER — SODIUM CHLORIDE 0.9 % IV SOLN: 250.0000 mL | INTRAVENOUS | Status: DC

## 2024-10-07 MED ORDER — POLYVINYL ALCOHOL 1.4 % OP SOLN: 1.0000 [drp] | Freq: Four times a day (QID) | OPHTHALMIC | Status: DC | PRN

## 2024-10-07 MED ORDER — ROCURONIUM BROMIDE 10 MG/ML (PF) SYRINGE
80.0000 mg | PREFILLED_SYRINGE | Freq: Once | INTRAVENOUS | Status: AC
  Administered 2024-10-07: 80 mg via INTRAVENOUS
  Filled 2024-10-07: qty 10

## 2024-10-07 MED ORDER — PHENYLEPHRINE CONCENTRATED 100MG/250ML (0.4 MG/ML) INFUSION SIMPLE
0.0000 ug/min | INTRAVENOUS | Status: DC
  Administered 2024-10-07: 100 ug/min via INTRAVENOUS
  Filled 2024-10-07: qty 250

## 2024-10-08 ENCOUNTER — Encounter: Admitting: Physician Assistant

## 2024-10-08 LAB — BODY FLUID CULTURE W GRAM STAIN

## 2024-10-10 LAB — CULTURE, BLOOD (ROUTINE X 2)
Culture: NO GROWTH
Culture: NO GROWTH
Special Requests: ADEQUATE

## 2024-10-12 LAB — AEROBIC/ANAEROBIC CULTURE W GRAM STAIN (SURGICAL/DEEP WOUND)

## 2024-10-21 NOTE — Procedures (Signed)
 ARTERIAL CATHETER INSERTION PROCEDURE NOTE  STEPHANINE REAS  [id number]  [birth year]  Date:10/28/24  Time:6:32 AM   Provider Performing: Almarie DELENA Nose   Procedure: Insertion of Arterial Line (63379) with US  guidance (23062)   Indication(s) Blood pressure monitoring and/or need for frequent ABGs  Consent Unable to obtain consent due to emergent nature of procedure.  Anesthesia None  Time Out Verified patient identification, verified procedure, site/side was marked, verified correct patient position, special equipment/implants available, medications/allergies/relevant history reviewed, required imaging and test results available.  Sterile Technique Maximal sterile technique including full sterile barrier drape, hand hygiene, sterile gown, sterile gloves, mask, hair covering, sterile ultrasound probe cover (if used).  Procedure Description Area of catheter insertion was cleaned with chlorhexidine and draped in sterile fashion. With real-time ultrasound guidance an arterial catheter was placed into the right radial artery.  Appropriate arterial tracings confirmed on monitor.    Complications/Tolerance None; patient tolerated the procedure well.  EBL Minimal  Specimen(s) None     Almarie Nose DNP, CCRN, FNP-C, AGACNP-BC Acute Care & Family Nurse Practitioner [facility] P[facility]M on call pager [phone]

## 2024-10-21 NOTE — TOC Progression Note (Signed)
 Transition of Care [facility]) - Progression Note    Patient Details  Name: Yolanda Harris MRN: [id number] Date of Birth: 13-Jul-1956  [facility] of [facility]) CM/SW Contact  K'La JINNY Ruts, LCSW Phone Number: 10/14/2024, 1:38 PM  Clinical Narrative:    Chart reviewed. Per rounds patient is transitioning to comfort care.                     Expected Discharge Plan and Services                                               Social Drivers of Health (SDOH) Interventions SDOH Screenings   Food Insecurity: No Food Insecurity (10/02/2024)  Housing: Low Risk  (10/02/2024)  Transportation Needs: No Transportation Needs (10/02/2024)  Utilities: Not At Risk (10/02/2024)  Depression (PHQ2-9): Low Risk  (09/27/2024)  Social Connections: Moderately Integrated (10/02/2024)  Tobacco Use: Medium Risk (10/06/2024)    Readmission Risk Interventions     No data to display

## 2024-10-21 NOTE — Death Summary Note (Signed)
 DEATH SUMMARY   Patient Details  Name: Yolanda Harris MRN: [id number] DOB: 11/25/1955  Admission/Discharge Information   Admit Date:  10-24-2024  Date of Death:  10/30/2024  Time of Death:  1339  Length of Stay: 5  Referring Physician: Albina GORMAN Dine, MD   Reason(s) for Hospitalization  Left Lower Extremity Cellulitis   Diagnoses  Preliminary cause of death: Septic shock (HCC) Secondary Diagnoses (including complications and co-morbidities):  Principal Problem:   Cellulitis of left lower extremity Active Problems:   Diabetes mellitus without complication (HCC)   HTN (hypertension)   HLD (hyperlipidemia)   Depression   Thrombocytopenia   PAF (paroxysmal atrial fibrillation) (HCC)   Obesity (BMI 30-39.9)   AKI (acute kidney injury)   Sepsis with acute renal failure (HCC)   Atrial fibrillation with rapid ventricular response (HCC)   Infected left prepatellar/infrapatellar bursa   Brief Hospital Course (including significant findings, care, treatment, and services provided and events leading to death)  Yolanda Harris is a 68 y.o. year old female with past medical history of hypertension, type 2 diabetes, hyperlipidemia, PAF, morbid obesity presenting with septic shock secondary to septic knee. She is status post I&D and washout on 10/06/2024 with course complicated by refractory shock and metabolic encephalopathy with refractory acidemia, worsening acute kidney injury with hyperkalemia, and lactic acidosis. Requiring mechanical intubation and multiple vasopressors on 2024/10/30.  Despite aggressive treatment pt continued to decline.  Following goals of care conversations pts family decided to transition pt to Comfort Measures Only on Oct 30, 2024, pt expired at 1339 with family present at bedside.    Pertinent Labs and Studies  Significant Diagnostic Studies ECHOCARDIOGRAM COMPLETE Result Date: 10-30-2024    ECHOCARDIOGRAM REPORT   Patient Name:   Yolanda Harris Date  of Exam: 10/06/2024 Medical Rec #:  [id number]         Height:       66.0 in Accession #:    [id number]        Weight:       231.5 lb Date of Birth:  17-Jul-1956        BSA:          2.128 m Patient Age:    67 years          BP:           96/52 mmHg Patient Gender: F                 HR:           114 bpm. Exam Location:  [facility] Procedure: 2D Echo, Cardiac Doppler and Color Doppler (Both Spectral and Color            Flow Doppler were utilized during procedure). Indications:     Abnormal ECG R94.31, Atrial Fibrillation I48.91  History:         Patient has prior history of Echocardiogram examinations.  Sonographer:     Bernice Rubinstein RDCS Referring Phys:  [id number] DEBBIE CROME BENJAMIN Diagnosing Phys: Annabella Scarce MD  Sonographer Comments: Suboptimal apical window, no subcostal window and patient is obese. Image acquisition challenging due to patient body habitus and Image acquisition challenging due to respiratory motion. IMPRESSIONS  1. Left ventricular ejection fraction, by estimation, is 65 to 70%. The left ventricle has normal function. The left ventricle has no regional wall motion abnormalities. Left ventricular diastolic parameters are indeterminate.  2. Right ventricular systolic function is normal. The right ventricular size is normal.  3. The mitral valve  is normal in structure. No evidence of mitral valve regurgitation. No evidence of mitral stenosis.  4. The aortic valve is tricuspid. Aortic valve regurgitation is not visualized. No aortic stenosis is present. FINDINGS  Left Ventricle: Left ventricular ejection fraction, by estimation, is 65 to 70%. The left ventricle has normal function. The left ventricle has no regional wall motion abnormalities. The left ventricular internal cavity size was normal in size. There is  no left ventricular hypertrophy. Left ventricular diastolic function could not be evaluated due to indeterminate diastolic function. Left ventricular diastolic parameters are indeterminate.  Right Ventricle: The right ventricular size is normal. No increase in right ventricular wall thickness. Right ventricular systolic function is normal. Left Atrium: Left atrial size was normal in size. Right Atrium: Right atrial size was normal in size. Pericardium: There is no evidence of pericardial effusion. Mitral Valve: The mitral valve is normal in structure. No evidence of mitral valve regurgitation. No evidence of mitral valve stenosis. Tricuspid Valve: The tricuspid valve is normal in structure. Tricuspid valve regurgitation is mild . No evidence of tricuspid stenosis. Aortic Valve: The aortic valve is tricuspid. Aortic valve regurgitation is not visualized. No aortic stenosis is present. Aortic valve peak gradient measures 12.2 mmHg. Pulmonic Valve: The pulmonic valve was normal in structure. Pulmonic valve regurgitation is not visualized. No evidence of pulmonic stenosis. Aorta: The aortic root is normal in size and structure. Venous: The inferior vena cava was not well visualized. IAS/Shunts: No atrial level shunt detected by color flow Doppler.  LEFT VENTRICLE PLAX 2D LVIDd:         4.50 cm   Diastology LVIDs:         2.80 cm   LV e' medial:  21.80 cm/s LV PW:         1.00 cm   LV e' lateral: 10.90 cm/s LV IVS:        0.90 cm LVOT diam:     2.00 cm LV SV:         36 LV SV Index:   17 LVOT Area:     3.14 cm  LEFT ATRIUM           Index LA diam:      3.40 cm 1.60 cm/m LA Vol (A2C): 34.3 ml 16.12 ml/m LA Vol (A4C): 49.0 ml 23.03 ml/m  AORTIC VALVE                 PULMONIC VALVE AV Area (Vmax): 2.19 cm     PV Vmax:        1.54 m/s AV Vmax:        175.00 cm/s  PV Peak grad:   9.5 mmHg AV Peak Grad:   12.2 mmHg    RVOT Peak grad: 4 mmHg LVOT Vmax:      122.00 cm/s LVOT Vmean:     79.100 cm/s LVOT VTI:       0.115 m  AORTA Ao Root diam: 3.00 cm Ao Asc diam:  3.00 cm TRICUSPID VALVE TR Peak grad:   16.6 mmHg TR Vmax:        204.00 cm/s  SHUNTS Systemic VTI:  0.12 m Systemic Diam: 2.00 cm Annabella Scarce MD  Electronically signed by Annabella Scarce MD Signature Date/Time: 2024/11/02/8:36:33 AM    Final    DG Chest Port 1 View Result Date: 11-02-24 EXAM: 1 VIEW(S) XRAY OF THE CHEST 11-02-2024 08:17:00 AM COMPARISON: 10/06/2024 CLINICAL HISTORY: Endotracheal tube present. FINDINGS: LINES, TUBES AND DEVICES: Endotracheal tube  tip is 3.4 cm above the carina. There is a right arm PICC line with the tip in the projection of the distal SVC. Enteric tube is noted which courses below the level of the hemidiaphragms, tip of the tube is below the inferior margin of the radiograph. LUNGS AND PLEURA: Lung volumes are low. There is new retrocardiac opacification which is favored to represent atelectasis. No pleural effusion. No pneumothorax. HEART AND MEDIASTINUM: No acute abnormality of the cardiac and mediastinal silhouettes. BONES AND SOFT TISSUES: No acute osseous abnormality. IMPRESSION: 1. New retrocardiac opacification favored to represent atelectasis. 2. Low lung volumes. Electronically signed by: Waddell Calk MD Oct 19, 2024 08:20 AM EST RP Workstation: [id number]   US  LIVER DOPPLER Result Date: 10/06/2024 CLINICAL DATA:  Abnormal liver enzymes EXAM: DUPLEX ULTRASOUND OF LIVER TECHNIQUE: Color and duplex Doppler ultrasound was performed to evaluate the hepatic in-flow and out-flow vessels. COMPARISON:  None Available. FINDINGS: Liver: Increased echogenicity with a slightly nodular contour suggesting underlying cirrhosis. No focal lesion, mass or intrahepatic biliary ductal dilatation. CBD 0.8 cm. Main Portal Vein size: 1.1 cm Portal Vein Velocities Main Prox:  19 cm/sec Main Mid: 19 cm/sec Main Dist:  20 cm/sec Right: 24 cm/sec Left: 24 cm/sec Hepatic Vein Velocities Right:  22 cm/sec Middle:  17 cm/sec Left:  15 cm/sec IVC: Present and patent with normal respiratory phasicity. Hepatic Artery Velocity:  175 cm/sec Splenic Vein Velocity:  16 cm/sec Spleen: 8.8 cm x 3.9 cm x 10.3 cm with a total volume of 187 cm^3  (411 cm^3 is upper limit normal) Portal Vein Occlusion/Thrombus: No Splenic Vein Occlusion/Thrombus: No Ascites: None Varices: None IMPRESSION: 1. Fatty appearance of the liver with possible underlying cirrhosis. 2. Hepatofugal portal vein flow. Electronically Signed   By: Fonda Field M.D.   On: 10/06/2024 17:40   DG Chest Port 1 View Result Date: 10/06/2024 CLINICAL DATA:  PICC line placement. EXAM: PORTABLE CHEST 1 VIEW COMPARISON:  10/05/2024, 09/12/2024. FINDINGS: Interval placement of right-sided PICC line with tip over the SVC. Lungs are hypoinflated without lobar consolidation or effusion. Subtle prominence of the central pulmonary vessels likely due to the degree of hypoinflation and unchanged. Cardiomediastinal silhouette and remainder of the exam is unchanged. IMPRESSION: 1. Hypoinflation without acute cardiopulmonary disease. 2. Right-sided PICC line with tip over the SVC. Electronically Signed   By: Toribio Agreste M.D.   On: 10/06/2024 15:44   US  EKG SITE RITE Result Date: 10/06/2024 If Site Rite image not attached, placement could not be confirmed due to current cardiac rhythm.  DG Chest 1 View Result Date: 10/05/2024 CLINICAL DATA:  Pulmonary edema. EXAM: CHEST  1 VIEW COMPARISON:  09/12/2024 FINDINGS: Lungs are hypoinflated without lobar consolidation or effusion. Minimal hazy prominence of the central pulmonary vessels likely due to the degree of hypoinflation. Mild stable cardiomegaly. Remainder of the exam is unchanged. IMPRESSION: 1. Hypoinflation without acute cardiopulmonary disease. 2. Mild stable cardiomegaly. Electronically Signed   By: Toribio Agreste M.D.   On: 10/05/2024 11:17   DG Knee Left Port Result Date: 10/02/2024 EXAM: 1 VIEW(S) XRAY OF THE LEFT KNEE 10/02/2024 01:24:00 PM COMPARISON: 06/17/2024. CLINICAL HISTORY: Cellulitis of left leg. FINDINGS: BONES AND JOINTS: No acute fracture or dislocation. No significant joint effusion. Similar medial femorotibial  compartment predominant moderate joint space narrowing and osteophytosis. SOFT TISSUES: Prepatellar soft tissue swelling and prominence. IMPRESSION: 1. No acute osseous abnormality. 2. Moderate medial femorotibial compartment osteoarthritis. 3. Prepatellar soft tissue swelling and prominence, as can be seen with prepatellar bursitis.  Electronically signed by: Harrietta Sherry MD 10/02/2024 01:58 PM EST RP Workstation: [id number]   US  Venous Img Lower Unilateral Left Result Date: 10/01/2024 CLINICAL DATA:  Left lower extremity swelling. EXAM: Left LOWER EXTREMITY VENOUS DOPPLER ULTRASOUND TECHNIQUE: Gray-scale sonography with compression, as well as color and duplex ultrasound, were performed to evaluate the deep venous system(s) from the level of the common femoral vein through the popliteal and proximal calf veins. COMPARISON:  None Available. FINDINGS: VENOUS Normal compressibility of the common femoral, superficial femoral, and popliteal veins, as well as the visualized calf veins. Visualized portions of profunda femoral vein and great saphenous vein unremarkable. No filling defects to suggest DVT on grayscale or color Doppler imaging. Doppler waveforms show normal direction of venous flow, normal respiratory plasticity and response to augmentation. Limited views of the contralateral common femoral vein are unremarkable. OTHER None. Limitations: none IMPRESSION: Negative. Electronically Signed   By: Vanetta Chou M.D.   On: 10/01/2024 20:47   DG Chest Portable 1 View Result Date: 09/12/2024 EXAM: 1 VIEW(S) XRAY OF THE CHEST 09/12/2024 08:42:00 AM COMPARISON: 07/09/2024 CLINICAL HISTORY: fall. Best obtainable images. Patient presents with right upper extremity pain after falling off of toilet this morning. FINDINGS: LUNGS AND PLEURA: Regressed bilateral interstitial markings. No focal pulmonary opacity. No pulmonary edema. No pleural effusion. No pneumothorax. HEART AND MEDIASTINUM: Aortic calcification.  No acute abnormality of the cardiac and mediastinal silhouettes. BONES AND SOFT TISSUES: No acute osseous abnormality. IMPRESSION: 1. No acute cardiopulmonary process. Electronically signed by: Helayne Hurst MD 09/12/2024 08:49 AM EDT RP Workstation: [id number]   DG Forearm Right Result Date: 09/12/2024 EXAM: 2 VIEW(S) XRAY OF THE RIGHT FOREARM 09/12/2024 08:42:00 AM COMPARISON: None available. CLINICAL HISTORY: Patient presents with right upper extremity pain after falling off of toilet this morning. FINDINGS: BONES AND JOINTS: No acute fracture. No focal osseous lesion. No joint dislocation. SOFT TISSUES: Peripheral venous catheter in place overlying the distal forearm. No discrete soft tissue injury. IMPRESSION: 1. No acute fracture or dislocation. Electronically signed by: Helayne Hurst MD 09/12/2024 08:47 AM EDT RP Workstation: [id number]   DG Humerus Right Result Date: 09/12/2024 EXAM: 2 VIEW(S) XRAY OF THE RIGHT HUMERUS 09/12/2024 08:42:00 AM COMPARISON: None available. CLINICAL HISTORY: Patient presents with right upper extremity pain after falling off of toilet this morning. FINDINGS: BONES AND JOINTS: No acute fracture. No focal osseous lesion. No joint dislocation. SOFT TISSUES: The soft tissues are unremarkable. IMPRESSION: 1. No acute fracture or dislocation. Electronically signed by: Helayne Hurst MD 09/12/2024 08:46 AM EDT RP Workstation: [id number]   CT Cervical Spine Wo Contrast Result Date: 09/12/2024 EXAM: CT CERVICAL SPINE WITHOUT CONTRAST 09/12/2024 07:38:22 AM TECHNIQUE: CT of the cervical spine was performed without the administration of intravenous contrast. Multiplanar reformatted images are provided for review. Automated exposure control, iterative reconstruction, and/or weight based adjustment of the mA/kV was utilized to reduce the radiation dose to as low as reasonably achievable. COMPARISON: Cervical spine MRI 06/22/2016. Face CT 09/12/2024 reported separately. CLINICAL HISTORY:  68 year old female with neck trauma after a fall, hitting her head. Denies LOC or blood thinners. Right forehead hematoma. FINDINGS: CERVICAL SPINE: BONES AND ALIGNMENT: Chronic straightening of cervical lordosis, mild reversal now. No acute fracture or traumatic malalignment. DEGENERATIVE CHANGES: Bulky chronic disc and endplate degeneration in the cervical spine C5-C6 through C7-T1. Mild chronic spinal stenosis suspected by CT. SOFT TISSUES: No prevertebral soft tissue swelling. Partially retropharyngeal course of the common carotid arteries. Mild respiratory motion in the upper chest. Redemonstrated abnormality of  right level 4/5 lymph node nearly 2 cm short axis on this study series 5 image 70. Smaller but increased in number left thoracic inlet lymph nodes. These are nonspecific. IMPRESSION: 1. No acute traumatic injury identified in the cervical spine. 2. Redemonstrated thoracic inlet increased lymph nodes, up to 2 cm short axis on the right. See Face CT today. 3. Bulky chronic disc and endplate degeneration at C5-C6 through C7-T1 with chronic spinal stenosis by CT. Electronically signed by: Helayne Hurst MD 09/12/2024 07:53 AM EDT RP Workstation: [id number]   CT Maxillofacial Wo Contrast Result Date: 09/12/2024 EXAM: CT OF THE FACE WITHOUT CONTRAST 09/12/2024 07:38:22 AM TECHNIQUE: CT of the face was performed without the administration of intravenous contrast. Multiplanar reformatted images are provided for review. Automated exposure control, iterative reconstruction, and/or weight based adjustment of the mA/kV was utilized to reduce the radiation dose to as low as reasonably achievable. COMPARISON: CT today reported separately. CLINICAL HISTORY: 68 year old female. Facial trauma, blunt. FINDINGS: FACIAL BONES: Absent and carious dentition. No acute facial fracture. No mandibular dislocation. No suspicious bone lesion. ORBITS: Globes and intraorbital soft tissues appear symmetric and normal. Superficial  right periorbital soft tissue hematoma and contusion is patchy most of the preseptal space is spared. No soft tissue gas. SINUSES AND MASTOIDS: Paranasal sinuses, tympanic cavities and mastoids clear. SOFT TISSUES: Retropharyngeal course of the common carotid arteries, normal variant. Unrelated appearing 15 mm intermediate density soft tissue nodule in the superior right parotid gland (coronal image 63). 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 Otherwise negative visible noncontrast deep soft tissue spaces of the face. Partially visible lymphadenopathy at the bilateral thoracic inlet, including the large right level 4 or level 5 lymph node which is 16 mm short axis on series 3 image 87. Thoracic inlet lymphadenopathy, nonspecific but suspicious for lymphoproliferative disorder such as leukemia or lymphoma. IMPRESSION: 1. Superficial right periorbital soft tissue injury.  No acute facial fracture. 2. Thoracic inlet lymphadenopathy is visible, nonspecific but suspicious for lymphoproliferative disorder such as Leukemia or Lymphoma. Recommend further evaluation. 3. 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 Recommend routine follow-up with ENT. Electronically signed by: Helayne Hurst MD 09/12/2024 07:49 AM EDT RP Workstation: [id number]   CT Head Wo Contrast Result Date: 09/12/2024 EXAM: CT HEAD WITHOUT CONTRAST 09/12/2024 07:38:22 AM TECHNIQUE: CT of the head was performed without the administration of intravenous contrast. Automated exposure control, iterative reconstruction, and/or weight based adjustment of the mA/kV was utilized to reduce the radiation dose to as low as reasonably achievable. COMPARISON: Head CT 06/18/2024. Face and cervical spine CT reported separately today. CLINICAL HISTORY: 68 year old female. Head trauma, minor. Fall this morning, hit head on cabinet. Denies loss of consciousness or blood thinners. Hematoma to right side of forehead.  FINDINGS: BRAIN AND VENTRICLES: No acute hemorrhage. No evidence of acute infarct. No hydrocephalus. No extra-axial collection. No mass effect or midline shift. Brain volume is stable, with minimal limits for age. No suspicious intracranial vascular hyperdensity. ORBITS: Globes and intraorbital soft tissues appear to remain normal. SINUSES: Right frontal sinus appears intact. Sinuses are clear. SOFT TISSUES AND SKULL: Right periorbital and supraorbital forehead mild scalp hematoma and contusion on series 4 image 19. Right frontal bone appears intact. Hyperostosis of the calvarium, normal variant. IMPRESSION: 1. Scalp soft tissue injury. 2. Normal for age non contrast CT appearance of the brain. Electronically signed by: Helayne Hurst MD 09/12/2024 07:44 AM EDT RP Workstation: [id number]    Microbiology Recent  Results (from the past 240 hours)  Blood Culture (routine x 2)     Status: None   Collection Time: 10/01/24  9:50 PM   Specimen: BLOOD  Result Value Ref Range Status   Specimen Description BLOOD LEFT ANTECUBITAL  Final   Special Requests   Final    BOTTLES DRAWN AEROBIC AND ANAEROBIC Blood Culture results may not be optimal due to an inadequate volume of blood received in culture bottles   Culture   Final    NO GROWTH 5 DAYS Performed at [facility], [address] [place], [place] [address]    Report Status 10/06/2024 FINAL  Final  Blood Culture (routine x 2)     Status: None   Collection Time: 10/01/24 10:05 PM   Specimen: BLOOD  Result Value Ref Range Status   Specimen Description BLOOD BLOOD LEFT FOREARM  Final   Special Requests   Final    BOTTLES DRAWN AEROBIC AND ANAEROBIC Blood Culture results may not be optimal due to an inadequate volume of blood received in culture bottles   Culture   Final    NO GROWTH 5 DAYS Performed at [facility], [address] [place], [place] [address]    Report Status 10/06/2024 FINAL  Final  Culture, blood (Routine X 2) w  Reflex to ID Panel     Status: None (Preliminary result)   Collection Time: 10/05/24  3:15 PM   Specimen: BLOOD  Result Value Ref Range Status   Specimen Description BLOOD BLOOD RIGHT HAND  Final   Special Requests   Final    BOTTLES DRAWN AEROBIC AND ANAEROBIC Blood Culture results may not be optimal due to an inadequate volume of blood received in culture bottles   Culture   Final    NO GROWTH 2 DAYS Performed at [facility], [address] [place], [place] [address]    Report Status PENDING  Incomplete  Culture, blood (Routine X 2) w Reflex to ID Panel     Status: None (Preliminary result)   Collection Time: 10/05/24  3:17 PM   Specimen: BLOOD  Result Value Ref Range Status   Specimen Description BLOOD BLOOD LEFT HAND  Final   Special Requests   Final    BOTTLES DRAWN AEROBIC ONLY Blood Culture adequate volume   Culture   Final    NO GROWTH 2 DAYS Performed at [facility], [address] [place], [place] [address]    Report Status PENDING  Incomplete  MRSA Next Gen by PCR, Nasal     Status: None   Collection Time: 10/05/24  4:11 PM   Specimen: Nasal Mucosa; Nasal Swab  Result Value Ref Range Status   MRSA by PCR Next Gen NOT DETECTED NOT DETECTED Final    Comment: (NOTE) The GeneXpert MRSA Assay (FDA approved for NASAL specimens only), is one component of a comprehensive MRSA colonization surveillance program. It is not intended to diagnose MRSA infection nor to guide or monitor treatment for MRSA infections. Test performance is not FDA approved in patients less than 31 years old. Performed at [facility]: [facility], [address] [place], [place] [address]   Body fluid culture w Gram Stain     Status: None (Preliminary result)   Collection Time: 10/05/24  6:17 PM   Specimen: Synovium; Body Fluid  Result Value Ref Range Status   Specimen Description   Final    SYNOVIAL Performed at [facility], [address] [place],  [place]  [address]    Special Requests   Final    NONE Performed at [facility], [address] [place], [place] [address]    Gram Stain   Final    MODERATE WBCs FEW GRAM POSITIVE COCCI GRAM STAIN REPORT CALLED TO BELVA NOVEMBER MD @ 10/05/2024 1936 AB Performed at [facility], [address], [place], [place] [address]    Culture   Final    FEW STAPHYLOCOCCUS AUREUS SUSCEPTIBILITIES TO FOLLOW Performed at [facility], [address] [place], [place] [address]    Report Status PENDING  Incomplete  Aerobic/Anaerobic Culture w Gram Stain (surgical/deep wound)     Status: None (Preliminary result)   Collection Time: 10/06/24  6:28 PM   Specimen: Joint, Other; Body Fluid  Result Value Ref Range Status   Specimen Description   Final    JOINT FLUID Performed at [facility], [address], [place], [place] [address]    Special Requests   Final    NONE Performed at [facility], [address] [place], [place] [address]    Gram Stain   Final    FEW WBC PRESENT, PREDOMINANTLY PMN FEW GRAM POSITIVE COCCI Performed at [facility], [address] [place], [place] [address]    Culture PENDING  Incomplete   Report Status PENDING  Incomplete  Aerobic/Anaerobic Culture w Gram Stain (surgical/deep wound)     Status: None (Preliminary result)   Collection Time: 10/06/24  6:31 PM   Specimen: Bursa/Synovial Cyst; Tissue  Result Value Ref Range Status   Specimen Description   Final    SYNOVIAL Performed at [facility], [address], [place], [place] [address]    Special Requests   Final    [facility] BURSA Performed at [facility], [address] [place], [place] [address]    Gram Stain   Final    RARE WBC PRESENT, PREDOMINANTLY PMN NO ORGANISMS SEEN Performed at [facility], [address], [place], [place] [address]    Culture PENDING  Incomplete   Report Status PENDING  Incomplete  Aerobic/Anaerobic  Culture w Gram Stain (surgical/deep wound)     Status: None (Preliminary result)   Collection Time: 10/06/24  6:32 PM   Specimen: Bursa/Synovial Cyst; Tissue  Result Value Ref Range Status   Specimen Description   Final    SYNOVIAL Performed at [facility], [address] [place], [place] [address]    Special Requests   Final    INFERIOR [facility] BURSA Performed at [facility], [address], [place], [place] [address]    Gram Stain   Final    RARE WBC PRESENT, PREDOMINANTLY PMN RARE GRAM POSITIVE COCCI Performed at [facility], [address], [place], [place] [address]    Culture PENDING  Incomplete   Report Status PENDING  Incomplete    Lab Basic Metabolic Panel: Recent Labs  Lab 10/02/24 0411 10/03/24 9394 10/04/24 0317 10/05/24 0612 10/05/24 1517 10/06/24 0312 2024-10-23 0425 2024/10/23 0716  NA 139 136 133* 132*  --  130*  --  131*  K 3.2* 4.2 4.4 4.2  --  5.0  --  5.4*  CL 110 110 107 107  --  101  --  94*  CO2 19* 18* 18* 16*  --  14*  --  8*  GLUCOSE 206* 145* 185* 146*  --  242*  --  266*  BUN 14  14 15 18   --  20  --  25*  CREATININE 1.20* 1.15* 1.23* 1.66*  --  2.50*  --  3.79*  CALCIUM  7.9* 8.0* 7.9* 7.7*  --  7.8*  --  7.1*  MG 2.3 2.2  --   --  2.1 2.2 2.0  --    Liver Function Tests: Recent Labs  Lab 10/05/24 1517 10/06/24 0312 October 18, 2024 0425 18-Oct-2024 0716  AST 665* 870* 691* 942*  ALT 168* 222* 175* 232*  ALKPHOS 190* 239* 237* 423*  BILITOT 2.9* 3.9* 3.7* 4.7*  PROT 5.6* 5.9* 5.0* 6.2*  ALBUMIN 2.4* 2.4* 2.0* 2.5*   No results for input(s): LIPASE, AMYLASE in the last 168 hours. Recent Labs  Lab 10-18-24 0716  AMMONIA 89*   CBC: Recent Labs  Lab 10/03/24 0605 10/04/24 0317 10/05/24 0612 10/06/24 0640 10-18-2024 0425  WBC 11.7* 10.6* 10.3 13.3* 15.3*  HGB 11.3* 11.1* 10.7* 10.9* 8.8*  HCT 32.9* 32.5* 30.6* 31.2* 29.2*  MCV 89.2 89.0 86.4 86.2 101.0*  PLT 58* 66* 77* 90* 102*   Cardiac  Enzymes: No results for input(s): CKTOTAL, CKMB, CKMBINDEX, TROPONINI in the last 168 hours. Sepsis Labs: Recent Labs  Lab 10/04/24 0317 10/05/24 0612 10/05/24 1250 10/06/24 0640 10/06/24 1053 10/06/24 1248 10/18/24 0425 18-Oct-2024 0716  WBC 10.6* 10.3  --  13.3*  --   --  15.3*  --   LATICACIDVEN  --   --    < >  --  3.6* 4.1* >9.0* >9.0*   < > = values in this interval not displayed.    Procedures/Operations  Incision and Debridement of Infected Left Prepatellar/Infrapatellar Bursa  Mechanical Intubation  Right Brachial PICC Line Placement  Left Radial Arterial Line Placement   Lonell Moose, AGNP  Pulmonary/Critical Care Pager [phone] (please enter 7 digits) PCCM Consult Pager [phone] (please enter 7 digits)

## 2024-10-21 NOTE — Progress Notes (Signed)
Pt. Extubated to room air. 

## 2024-10-21 NOTE — Progress Notes (Signed)
   02-Nov-2024 1100  Spiritual Encounters  Type of Visit Follow up  Care provided to: Pt and family (Many family mbrs changing places from the waiting room to the Pt's room.)  Referral source Chaplain team  Reason for visit Routine spiritual support  OnCall Visit Yes  Spiritual Framework  Presenting Themes Meaning/purpose/sources of inspiration;Values and beliefs;Coping tools;Impactful experiences and emotions;Other (comment) (for Family)  Family Stress Factors Loss  Interventions  Spiritual Care Interventions Made Established relationship of care and support;Compassionate presence;Reflective listening;Explored values/beliefs/practices/strengths;Meaning making;Supported grief process;Other (comment) (for Family; Family is quoting healing scriptures from the Bible today.)  Intervention Outcomes  Outcomes Connection to spiritual care;Awareness around self/spiritual resourses;Awareness of support;Other (comment) (for Family)

## 2024-10-21 NOTE — Progress Notes (Signed)
 PT Cancellation Note  Patient Details Name: Yolanda Harris MRN: [id number] DOB: 1956/09/21   Cancelled Treatment:    Reason Eval/Treat Not Completed: Patient not medically ready (Patient was encephalopathic, hypotensive, with respiratory failure and transfer to ICU and is now intubated. Please re-consult when appropriate.)  Randine Essex, PT, MPT   Randine LULLA Essex 2024/10/20, 8:28 AM

## 2024-10-21 NOTE — Progress Notes (Addendum)
 eLink Physician-Brief Progress Note Patient Name: Yolanda Harris DOB: [birth year] MRN: [id number]   Date of Service  2024-10-13  HPI/Events of Note  68 year old female admitted with septic shock secondary to cellulitis and septic arthritis with GPC in chains.  Status post knee washout.  Over the course of the evening, the patient became progressively more encephalopathic and is now Chrismon breathing with limited response to painful stimulus.  Results show severe metabolic acidemia with limited lactic acidosis, no significant azotemia, and worsening LFTs.  eICU Interventions  Will check an array of labs-INR, acetaminophen level, ammonia, beta-hydroxybutyrate.  Glucoses within normal limits.  Given her reduced level of consciousness, I would be reasonable to proceed with intubation for airway protection and hyperventilation.   Push 2 ampoules of bicarb prior to intubation, initiate bicarb infusion   0629 -recurrent episode of hypotension, seems to correlate with severe acidemia.  This episode, the patient had discordant arterial line pressures to the cough.  Dropped to 50s over 20s.  This improved with power flushing the line.  1 ampoule of bicarb pushed.  Initiation of epinephrine infusion due to suboptimal maps.  Intervention Category Intermediate Interventions: Respiratory distress - evaluation and management  Cote Mayabb October 13, 2024, 5:03 AM

## 2024-10-21 NOTE — IPAL (Signed)
 INTERDISCIPLINARY GOALS OF CARE FAMILY MEETING    Patient Name: Yolanda Harris   MRN: [id number]   Date of Birth/ Sex: 1956-01-26 , female      Admission Date: 10/01/2024  Attending Provider: Isadora Hose, MD  Primary Diagnosis: Cellulitis of left lower extremity     Date carried out: November 05, 2024 Location of the meeting: Bedside   Member's involved: NP and Family Member or next of kin   Durable Power of Attorney or acting medical decision maker: Daughters and Husband    Discussion:  Advance Care Planning/Goals of Care discussion was performed during the course of treatment to decide on type of care right for this patient following change in clinical status.   I met with patient's family listed above to discuss goals of care in details following  change in patient's current status. Reviewed patient's worsening lab, ABGs, vital signs including unstable HR and blood pressure requiring multiple pressors  and overall poor prognosis with the family at the bedside and answered all their question.   Discussed prognosis, expected outcome with or without ongoing aggressive treatments and the options for de-escalation of care.   Diagnosis(es): Acute hypoxic hypercapnic respiratory failure secondary Severe Sepsis with multiorgan Failure, severe Metabolic acidosis with Lactic acidosis  Prognosis: Poor Code Status: DNR Disposition: ICU Next Steps:  Family understands the situation. They have consented and agreed to DNR/DNI and would not wish to pursue any aggressive treatment.  Patient's family would consider  transitioning to comfort care if her condition does not improve with current measures.    Family are satisfied with Plan of action and management. All questions answered     Total Time Spent Face to Face addressing advance care planning in the presence of the Patient: 35 minutes         Almarie Nose DNP, CCRN, FNP-C, AGACNP-BC Acute Care & Family Nurse Practitioner [place]  Pulmonary & Critical Care Medicine PCCM on call pager [phone]

## 2024-10-21 NOTE — Progress Notes (Signed)
 Pt's time of death: 10/27/2024 at 1339   Charlie RN and Jon RN auscultated heart and lung sound x2 min. No lungs or heart sounds were auscultated.    No visible chest rise seen   Notified eLink  Printed asystole strip and placed in chart

## 2024-10-21 NOTE — Progress Notes (Signed)
   Subjective: 1 Day Post-Op Procedure(s) (LRB): IRRIGATION AND DEBRIDEMENT KNEE (Left) Patient became encephalopathic, hypotensive, with respiratory failure last night. Patient was intubated and a-line placed.  Objective: Vital signs in last 24 hours: Temp:  [97.8 F (36.6 C)-98.8 F (37.1 C)] 98.2 F (36.8 C) October 23, 2024 0000) Pulse Rate:  [99-150] 117 10/23/24 0715) Resp:  [13-37] 37 10/23/2024 0730) BP: (68-119)/(28-91) 84/39 (11/17 0730) SpO2:  [92 %-100 %] 100 % 10/23/2024 0715) Arterial Line BP: (58-63)/(32-34) 63/34 (11/17 0730) FiO2 (%):  [100 %] 100 % October 23, 2024 0530)  Intake/Output from previous day: 11/16 0701 - 2024-10-23 0700 In: 2791.9 [I.V.:1891.6; IV Piggyback:900.3] Out: 1005 [Urine:975; Blood:30] Intake/Output this shift: No intake/output data recorded.  Recent Labs    10/05/24 0612 10/06/24 0640 2024/10/23 0425  HGB 10.7* 10.9* 8.8*   Recent Labs    10/06/24 0640 Oct 23, 2024 0425  WBC 13.3* 15.3*  RBC 3.62* 2.89*  HCT 31.2* 29.2*  PLT 90* 102*   Recent Labs    10/05/24 0612 10/06/24 0312  NA 132* 130*  K 4.2 5.0  CL 107 101  CO2 16* 14*  BUN 18 20  CREATININE 1.66* 2.50*  GLUCOSE 146* 242*  CALCIUM  7.7* 7.8*   Recent Labs    10/05/24 2020  INR 1.9*    EXAM General - Patient is sedated, intubated Extremity - Compartment soft Dressing - dressing C/D/I and no drainage, provena negative pressure dressing intact with scant bloody drainage in canister.   Past Medical History:  Diagnosis Date   Diabetes mellitus without complication (HCC)    HLD (hyperlipidemia)    Hypertension    PAF (paroxysmal atrial fibrillation) (HCC)    Sciatic leg pain    Thrombocytopenia     Assessment/Plan:   1 Day Post-Op Procedure(s) (LRB): IRRIGATION AND DEBRIDEMENT KNEE (Left) Principal Problem:   Cellulitis of left lower extremity Active Problems:   Diabetes mellitus without complication (HCC)   HTN (hypertension)   HLD (hyperlipidemia)   Depression    Thrombocytopenia   PAF (paroxysmal atrial fibrillation) (HCC)   Obesity (BMI 30-39.9)   AKI (acute kidney injury)   Sepsis with acute renal failure (HCC)   Atrial fibrillation with rapid ventricular response (HCC)  Estimated body mass index is 37.37 kg/m as calculated from the following:   Height as of 09/16/24: 5' 6 (1.676 m).   Weight as of 09/27/24: 105 kg.  Continue with IV abx per ID Cultures showing rare gram positive cocci Sensitivities pending Continue with provena negative pressure dressing  DVT Prophylaxis - Aspirin  T. Medford Amber, PA-C [facility] 10-23-24, 7:54 AM

## 2024-10-21 NOTE — Progress Notes (Signed)
   10-12-24 1256  Spiritual Encounters  Type of Visit Follow up  Care provided to: Pt and family  Referral source Nurse (RN/NT/LPN)  Reason for visit End-of-life  OnCall Visit Yes  Spiritual Framework  Presenting Themes Values and beliefs;Impactful experiences and emotions;Other (comment) (for Family)  Interventions  Spiritual Care Interventions Made Compassionate presence;Reflective listening;Normalization of emotions;Narrative/life review;Meaning making;Bereavement/grief support;Other (comment) (for Family)  Intervention Outcomes  Outcomes Connection to spiritual care;Awareness of health;Awareness of support;Other (comment) (Compassionate presence w/ family)

## 2024-10-21 NOTE — Progress Notes (Signed)
   10-11-2024 0700  Spiritual Encounters  Type of Visit Initial  Care provided to: Family  Referral source Nurse (RN/NT/LPN)  Reason for visit Urgent spiritual support  OnCall Visit Yes  Spiritual Framework  Presenting Themes Coping tools  Family Stress Factors Loss  Interventions  Spiritual Care Interventions Made Compassionate presence;Prayer   Chaplain responded to call to provide care for family of patient who is in critical condition. Chaplain interceded through compassionate presence and was asked to intervene through prayer for the family.

## 2024-10-21 NOTE — Anesthesia Postprocedure Evaluation (Signed)
 Anesthesia Post Note  Patient: Yolanda Harris  Procedure(s) Performed: IRRIGATION AND DEBRIDEMENT KNEE (Left: Knee)  Patient location during evaluation: ICU Anesthesia Type: General Level of consciousness: awake and alert Pain management: pain level controlled Vital Signs Assessment: vitals unstable Respiratory status: respiratory function stable and patient on ventilator - see flowsheet for VS Cardiovascular status: tachycardic Postop Assessment: no apparent nausea or vomiting Anesthetic complications: no Comments: Patient being managed by the ICU team   No notable events documented.   Last Vitals:  Vitals:   11/02/2024 0715 2024/11/02 0730  BP: (!) 83/28 (!) 84/39  Pulse: (!) 117   Resp: (!) 26 (!) 37  Temp:    SpO2: 100%     Last Pain:  Vitals:   02-Nov-2024 0000  TempSrc: Axillary  PainSc:                  [place] [facility]

## 2024-10-21 NOTE — Progress Notes (Signed)
   2024/10/23 1120  Spiritual Encounters  Type of Visit Follow up  Care provided to: Family (Many family members in the waiting room asked me to pray!)  Referral source Family  Reason for visit Urgent spiritual support  OnCall Visit Yes  Spiritual Framework  Presenting Themes Coping tools;Impactful experiences and emotions;Other (comment) (for Family)  Interventions  Spiritual Care Interventions Made Established relationship of care and support;Compassionate presence;Reflective listening;Prayer;Other (comment) (for Family)  Intervention Outcomes  Outcomes Connection to spiritual care;Awareness of support;Other (comment) (for Family)

## 2024-10-21 NOTE — Progress Notes (Signed)
 Yolanda Harris                                          MRN: [id number]   10-10-24   The [facility]BCI Quality Team Specialist reviewed this patient medical record for the purposes of chart review for care gap closure. The following were reviewed: abstraction for care gap closure-kidney health evaluation for diabetes:eGFR  and uACR.    VBCI Quality Team

## 2024-10-21 NOTE — IPAL (Signed)
  Interdisciplinary Goals of Care Family Meeting   Date carried out: 2024-10-23  Location of the meeting: Unit  Member's involved: Nurse Practitioner, Bedside Registered Nurse, and Family Member or next of kin  Durable Power of Attorney or acting medical decision maker: Pt spouse, pts daughter Darrian Goodwill, and daughter Zada Reasoner     Discussion: We discussed goals of care for [employer] .  Pt with multiorgan failure despite aggressive treatment.  She is currently on 4 vasopressors, and remains hypotensive map 36.  She is High Risk For Cardiac Arrest and Sudden Death.  Discussed pt would likely need dialysis if they would like to continue aggressive treatment.  Following discussions pts family have decided they DO NOT want to proceed with any form of dialysis.  They have decided they don't want to prolong pts suffering, and would like to transition pt to Comfort Measures Only once family has had time to visit pt.  They have decided if pt declines prior to terminal extubation they DO NOT WANT TO ESCALATE treatment   Code status:   Code Status: Limited: Do not attempt resuscitation (DNR) -DNR-LIMITED -Do Not Intubate/DNI    Disposition: In-patient comfort care once family have had time to visit pt   Time spent for the meeting: 20 minutes     Lonell Moose, AGNP  Pulmonary/Critical Care Pager [phone] (please enter 7 digits) PCCM Consult Pager [phone] (please enter 7 digits)

## 2024-10-21 NOTE — Procedures (Signed)
 INTUBATION PROCEDURE NOTE  EVIANA SIBILIA  [id number]  [birth year]  Date:2024/11/06  Time:6:30 AM   Provider Performing:Ivanell Deshotel A Maclovio Henson   Procedure: Intubation (31500)  Indication(s) Respiratory Failure  Consent Unable to obtain consent due to emergent nature of procedure.  Anesthesia Etomidate and Rocuronium  Time Out Verified patient identification, verified procedure, site/side was marked, verified correct patient position, special equipment/implants available, medications/allergies/relevant history reviewed, required imaging and test results available.  Sterile Technique Usual hand hygeine, masks, and gloves were used  Procedure Description Patient positioned in bed supine.  Sedation given as noted above.  Patient was intubated with endotracheal tube using Glidescope.  View was Grade 1 full glottis .  Number of attempts was 1. ETT size 7.5cm taped 25cm @ Lip Colorimetric CO2 detector was consistent with tracheal placement.  Complications/Tolerance None; patient tolerated the procedure well. Chest X-ray is ordered to verify placement.  EBL Minimal  Specimen(s) None     Almarie Nose DNP, CCRN, FNP-C, AGACNP-BC Acute Care & Family Nurse Practitioner [place] Pulmonary & Critical Care Medicine PCCM on call pager [phone]

## 2024-10-21 NOTE — Progress Notes (Signed)
 OT Cancellation Note  Patient Details Name: Yolanda Harris MRN: [id number] DOB: May 03, 1956   Cancelled Treatment:    Reason Eval/Treat Not Completed: Medical issues which prohibited therapy(Patient was encephalopathic, hypotensive, with respiratory failure and transfer to ICU and is now intubated. Please re-consult when appropriate   Izetta Claude, MS, OTR/L , CBIS ascom [phone]  10-27-24, 8:42 AM

## 2024-10-21 NOTE — Progress Notes (Signed)
 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 She was referred as a direct admit for IV abx (rocephin + vancomycin -> linezolid) and imaging. ID consulted, guiding antibiotic therapy. US  LLE venous negative for DVT. Upon admission, she was febrile and had blood cultures drawn (11/11; NGTD). Orthopedics consulted, low suspicion of septic arthritis, risk of bacterial translocation outweighs benefit of arthrocentesis.   PCCM consulted 11/15 to evaluate for transfer iso hypotension (MAPs 50s). Found patient to be in Afib with RVR (off home beta blocker), receiving fluid boluses (2.5L total), transiently responsive. Lactate 3.5-3.9, non-anion gap metabolic acidosis. Electrolyte abnormalities include mild hyponatremia, hypocalcemia iso AKI (sCr baseline <1.0).   Pertinent  Medical History  HTN HLD IDDM PAF Peripheral venous HTN Obesity Altered mobility Thrombocytopenia Lymphadenopathy at level of thoracic inlet concerning for lymphoproliferative disease  Micro Data:  Blood x2 11/11>>negative  MRSA PCR 11/15>>negative  Left joint fluid 11/16>>rare gram positive cocci  Blood x2 11/15>>NGTD Tissue from bursa/synovial cyst 11/16>>no organisms seen  Left inferior pre patella bursa 11/16>>rare wbc present,  rare gram positive cocci   Anti-infectives (From admission, onward)    Start     Dose/Rate Route Frequency Ordered Stop   10/06/24 1905  vancomycin (VANCOCIN) powder  Status:  Discontinued          As needed 10/06/24 1905 10/06/24 2023   10/04/24 2200  linezolid (ZYVOX) IVPB 600 mg        600 mg 300 mL/hr over 60 Minutes Intravenous Every 12 hours 10/04/24 1700     10/02/24 2230  vancomycin (VANCOCIN) IVPB 1000 mg/200 mL premix  Status:  Discontinued        1,000 mg 200 mL/hr over 60 Minutes Intravenous Every 24 hours 10/02/24 0144 10/04/24 1700   10/02/24 2200  cefTRIAXone (ROCEPHIN) 2 g in sodium chloride 0.9 % 100 mL IVPB        2 g 200 mL/hr over 30 Minutes Intravenous Every 24 hours 10/01/24 2336     10/01/24 2200  vancomycin (VANCOREADY) IVPB 2000 mg/400 mL        2,000 mg 200 mL/hr over 120 Minutes Intravenous  Once 10/01/24 2147 10/02/24 0115   10/01/24 2145  cefTRIAXone (ROCEPHIN) 2 g in sodium chloride 0.9 % 100 mL IVPB        2 g 200 mL/hr over 30 Minutes Intravenous  Once 10/01/24 2138 10/01/24 2231       Significant Hospital Events: Including procedures, antibiotic start and stop dates in addition to other pertinent events   11/15: admit from wound clinic for worsening cellulitis LLE. Transfer to ICU for septic shock, afib with RVR 11/16: Pt remains on amiodarone  gtt @30  mg/hr, however remains in atrial fibrillation hr 130 to 140's.  Remains on levophed gtt @8  mcg/min to maintain map >65.  Orthopedic consulted pt pending I&D of the left knee  11/04/24: Pt declined overnight  developed acute respiratory failure secondary to severe metabolic and lactic acidosis, acute kidney injury, and worsening septic shock.  She is requiring vasopressin/levophed/neo-synephrine/epinephrine gtts, however remains hypotensive map 34.  Pts family have decided NOT to escalate care and will transition to Comfort Measures Only once additional family members visits the pt.    Interim History /  Subjective:  As outlined above under significant events   Objective    Blood pressure (!) 84/39, pulse (!) 117, temperature 98.2 F (36.8 C), temperature source Axillary, resp. rate (!) 37, SpO2 100%.    Vent Mode: PRVC FiO2 (%):  [100 %] 100 % Set Rate:  [18 bmp] 18 bmp Vt Set:  [500 mL] 500 mL PEEP:  [5 cmH20] 5 cmH20 Plateau Pressure:  [16 cmH20] 16 cmH20   Intake/Output Summary (Last 24 hours) at 10/09/2024 0748 Last data filed at 10/09/2024 0700 Gross per 24 hour  Intake 2791.94 ml  Output 1005 ml  Net 1786.94 ml   There were no vitals filed for this visit.  Examination: General: Acutely-ill appearing female, NAD mechanically intubated  HENT: Supple, difficult to assess for JVD due to body habitus  Lungs: Rhonchi throughout, even, non labored  Cardiovascular: Irregular irregular, no m/r/g, 1+ palpable radial/1+ distal pulses via doppler, 2+ bilateral lower extremity edema  Abdomen: +BS x4, soft, obese, non distended  Extremities: Left LE surgical site unable to visualize dressing dry/intact  Neuro: Not following commands, not withdrawing from painful stimulation, gag/corneal reflexes present, PERRL  GU: Indwelling foley catheter draining   Resolved problem list   Assessment and Plan   #Acute toxic metabolic encephalopathy  #Mechanical intubation pain/discomfort - Correct metabolic derangements  - Maintain RASS goal 0 to -1 - Currently not requiring sedating medications  - WUA daily   #Distributive shock~multifactorial: sepsis and severe metabolic/lactic acidosis  #PAF with RVR~improving  #Mildly elevated troponin likely secondary to demand ischemia Hx: HTN and HLD  - Continuous telemetry monitoring  - Continue vasopressin/levophed/epinephrine/neo-synephrine gtts to maintain map >65 - Hold outpatient antihypertensives and diuretic for now   #Acute respiratory failure  #Severe metabolic acidosis  #Mechanical ventilation  - Full vent support for now: vent  settings reviewed and established - Continue lung protective strategies  - Maintain plateau pressures less than 30 cm H2O  - VAP bundle implemented  - SBT once all parameters met  - Prn CXR and ABG's   #Acute kidney injury secondary to ATN~worsening   #Hyperkalemia  #Severe anion gap metabolic acidosis  #Severe lactic acidosis  #Hyponatremia  - Trend BMP, ABG, and lactic acid - Replace electrolytes as indicated  - Strict I&O's - Avoid nephrotoxic agents as able  - Discussed consulting nephrology due to likelihood of pt requiring dialysis due to worsening acute kidney injury.  Pts family decided they DO NOT WANT to proceed with dialysis and would like to transition to Comfort Measures Only once additional family members have had time to visit pt   #Transaminitis~worsening  #Elevated INR~worsening  #Elevate alk phos~worsening  US  Liver Doppler 11/16: Fatty appearance of the liver with possible underlying cirrhosis. Hepatofugal portal vein flow. - Trend hepatic function panel and coags  - Trend hepatotoxic agents as able   #Sepsis~worsening   #Left lower extremity cellulitis  #Left knee effusion s/p I&D 11/17~gram stain positive with gram positive cocci in pairs  - Trend WBC and monitor fever curve  - Follow cultures - Continue abx as outline above pending culture results and sensitivities  - Ortho consulted appreciate input  #  Anemia without obvious signs of bleeding  #Thrombocytopenia  - Trend CBC  - Monitor for s/sx of bleeding  - Transfuse for hgb <7  #Type II diabetes mellitus  - CBG's q4hrs  - Follow hypo/hyperglycemic protocol  - Target CBG readings 140 to 180  Pt with multiorgan failure despite aggressive treatment.  She is currently on 4 vasopressors, and remains hypotensive.  She is High Risk For Cardiac Arrest and Sudden Death.  Discussed pt would likely need dialysis if they would like to continue aggressive treatment.  Following discussions pts family have  decided they DO NOT want to proceed with any form of dialysis.  They have decided they don't want to prolong pts suffering, and would like to transition pt to Comfort Measures Only once family has had time to visit pt.  They have decided if pt declines prior to terminal extubation they DO NOT WANT TO ESCALATE treatment  Labs   CBC: Recent Labs  Lab 10/03/24 0605 10/04/24 0317 10/05/24 0612 10/06/24 0640 2024-10-23 0425  WBC 11.7* 10.6* 10.3 13.3* 15.3*  HGB 11.3* 11.1* 10.7* 10.9* 8.8*  HCT 32.9* 32.5* 30.6* 31.2* 29.2*  MCV 89.2 89.0 86.4 86.2 101.0*  PLT 58* 66* 77* 90* 102*    Basic Metabolic Panel: Recent Labs  Lab 10/02/24 0411 10/03/24 0605 10/04/24 0317 10/05/24 0612 10/05/24 1517 10/06/24 0312 2024/10/23 0425  NA 139 136 133* 132*  --  130*  --   K 3.2* 4.2 4.4 4.2  --  5.0  --   CL 110 110 107 107  --  101  --   CO2 19* 18* 18* 16*  --  14*  --   GLUCOSE 206* 145* 185* 146*  --  242*  --   BUN 14 14 15 18   --  20  --   CREATININE 1.20* 1.15* 1.23* 1.66*  --  2.50*  --   CALCIUM  7.9* 8.0* 7.9* 7.7*  --  7.8*  --   MG 2.3 2.2  --   --  2.1 2.2 2.0   GFR: Estimated Creatinine Clearance: 26 mL/min (A) (by C-G formula based on SCr of 2.5 mg/dL (H)). Recent Labs  Lab 10/04/24 0317 10/05/24 0612 10/05/24 1250 10/05/24 1517 10/06/24 0640 10/06/24 1053 10/06/24 1248 Oct 23, 2024 0425  WBC 10.6* 10.3  --   --  13.3*  --   --  15.3*  LATICACIDVEN  --   --    < > 3.5*  --  3.6* 4.1* >9.0*   < > = values in this interval not displayed.    Liver Function Tests: Recent Labs  Lab 10/05/24 1517 10/06/24 0312 10/23/24 0425  AST 665* 870* 691*  ALT 168* 222* 175*  ALKPHOS 190* 239* 237*  BILITOT 2.9* 3.9* 3.7*  PROT 5.6* 5.9* 5.0*  ALBUMIN 2.4* 2.4* 2.0*   No results for input(s): LIPASE, AMYLASE in the last 168 hours. No results for input(s): AMMONIA in the last 168 hours.  ABG    Component Value Date/Time   PHART 7.07 (LL) 23-Oct-2024 0425   PCO2ART 27  (L) 2024-10-23 0425   PO2ART 87 10/23/24 0425   HCO3 7.8 (L) 10-23-24 0425   ACIDBASEDEF 21.0 (H) 2024-10-23 0425   O2SAT 97.8 2024/10/23 0425     Coagulation Profile: Recent Labs  Lab 10/02/24 0224 10/05/24 2020  INR 1.7* 1.9*    Cardiac Enzymes: No results for input(s): CKTOTAL, CKMB, CKMBINDEX, TROPONINI in the last 168 hours.  HbA1C: Hgb A1c MFr Bld  Date/Time Value Ref Range Status  07/19/2024 02:58 PM 7.1 (H) 4.8 - 5.6 % Final    Comment:             Prediabetes: 5.7 - 6.4          Diabetes: >6.4          Glycemic control for adults with diabetes: <7.0   04/18/2024 02:58 PM 7.8 (H) 4.8 - 5.6 % Final    Comment:             Prediabetes: 5.7 - 6.4          Diabetes: >6.4          Glycemic control for adults with diabetes: <7.0     CBG: Recent Labs  Lab 10/06/24 1204 10/06/24 1554 10/06/24 2025 10/06/24 2148 2024-10-17 0742  GLUCAP 220* 164* 175* 183* 221*    Review of Systems: Positives in BOLD   Unable to assess pt mechanically intubated   Past Medical History:  She,  has a past medical history of Diabetes mellitus without complication (HCC), HLD (hyperlipidemia), Hypertension, PAF (paroxysmal atrial fibrillation) (HCC), Sciatic leg pain, and Thrombocytopenia.   Surgical History:   Past Surgical History:  Procedure Laterality Date   ABDOMINAL HYSTERECTOMY     HTN       Social History:   reports that she has quit smoking. Her smoking use included cigarettes. She has never used smokeless tobacco. She reports that she does not currently use alcohol. She reports that she does not use drugs.   Family History:  Her family history is negative for Breast cancer.   Allergies Allergies  Allergen Reactions   Amiodarone  Shortness Of Breath   Sulfa Antibiotics Rash     Home Medications  Prior to Admission medications   Medication Sig Start Date End Date Taking? Authorizing Provider  amitriptyline (ELAVIL) 25 MG tablet TAKE 1 TABLET EVERY  MORNING 01/09/24  Yes Tejan-Sie, S Ahmed, MD  amLODipine -benazepril  (LOTREL) 5-20 MG capsule Take 1 capsule by mouth daily. 07/30/24  Yes Albina GORMAN Dine, MD  atorvastatin  (LIPITOR) 10 MG tablet TAKE 1 TABLET EVERY EVENING 03/25/24  Yes Tejan-Sie, S Ahmed, MD  cyclobenzaprine  (FLEXERIL ) 10 MG tablet Take 1 tablet (10 mg total) by mouth 3 (three) times daily. 07/30/24  Yes Albina GORMAN Dine, MD  empagliflozin (JARDIANCE) 25 MG TABS tablet Take 25 mg by mouth daily. 09/16/24  Yes [provider]  furosemide  (LASIX ) 40 MG tablet Take 1 tablet (40 mg total) by mouth daily. 07/30/24 07/30/25 Yes Tejan-Sie, GORMAN Dine, MD  meloxicam  (MOBIC ) 15 MG tablet TAKE 1 TABLET EVERY DAY 03/25/24  Yes Tejan-Sie, S Ahmed, MD  metoprolol  tartrate (LOPRESSOR ) 25 MG tablet TAKE 1 TABLET TWICE DAILY 06/03/24  Yes Tejan-Sie, S Ahmed, MD  olmesartan (BENICAR) 40 MG tablet Take 1 tablet (40 mg total) by mouth daily. 09/06/24 10/06/24 Yes Albina GORMAN Dine, MD  pregabalin  (LYRICA ) 300 MG capsule Take 1 capsule (300 mg total) by mouth 2 (two) times daily. 07/29/24 01/25/25 Yes Albina GORMAN Dine, MD  rosuvastatin (CRESTOR) 20 MG tablet Take 20 mg by mouth daily.   Yes [provider]  Semaglutide , 2 MG/DOSE, (OZEMPIC , 2 MG/DOSE,) 8 MG/3ML SOPN Inject 2 mg into the skin once a week. 07/19/24 10/11/24 Yes Albina GORMAN Dine, MD  triamcinolone cream (KENALOG) 0.1 % Apply 1 Application topically 2 (two) times daily.   Yes [provider]  Alcohol Swabs (DROPSAFE ALCOHOL PREP) 70 % PADS USE TO CLEAN FINGER BEFORE  STICKING TO CHECK SUGAR AS DIRECTED 06/03/24   Albina GORMAN Dine, MD  amiodarone  (PACERONE ) 200 MG tablet Take 2 tablets by mouth 2 (two) times daily. Patient not taking: Reported on 09/27/2024    [provider]  cetirizine  (ZYRTEC  ALLERGY) 10 MG tablet Take 1 tablet (10 mg total) by mouth daily. Patient not taking: Reported on 10/02/2024 07/29/24 10/27/24  Albina GORMAN Dine, MD  fluticasone  (FLONASE ) 50  MCG/ACT nasal spray Place 1 spray into both nostrils daily. Patient not taking: Reported on 10/02/2024 07/08/24 10/06/24  Albina GORMAN Dine, MD  furosemide  (LASIX ) 40 MG tablet Take 1 tablet (40 mg total) by mouth 2 (two) times daily for 14 days. 09/06/24 09/27/24  Albina GORMAN Dine, MD  glucose blood (RELION TRUE METRIX TEST STRIPS) test strip Use with device to check sugars  up to three times daily 01/05/24   Albina GORMAN Dine, MD  pioglitazone  (ACTOS ) 45 MG tablet Take 1 tablet (45 mg total) by mouth every morning. Patient not taking: Reported on 10/02/2024 07/19/24   Albina GORMAN Dine, MD  TRESIBA  FLEXTOUCH 200 UNIT/ML FlexTouch Pen INJECT 96 UNITS SUBCUTANEOUSLY ONCE DAILY Patient not taking: Reported on 10/02/2024 08/16/24   Albina GORMAN Dine, MD  TRUEplus Lancets 33G MISC TEST BLOOD SUGAR UP TO THREE TIMES DAILY 06/03/24   Tejan-Sie, S Ahmed, MD  Zinc  Oxide 15 % CREA Apply to left leg wound bid Patient not taking: No sig reported 02/13/23   Albina GORMAN Dine, MD     Critical care time: 70 min    Lonell Moose, AGNP  Pulmonary/Critical Care Pager [phone] (please enter 7 digits) PCCM Consult Pager [phone] (please enter 7 digits)

## 2024-10-21 NOTE — Progress Notes (Signed)
 Left leg cellulitis/left knee prepatellar bursitis On 11/15 pt was transferred to ICU for sepsis with multiorgan involvement Dr.Dgayli intensivist  communicated with oncall ID and Oncall ortho He aspirated the knee area abscess and gram stain was gram positive cocci- linezolid and ceftriaxone was continued. I had communicated with  Dr.Dgayli by secure chat on 11/16 -( I was not on call on 11/15 or 11/16 weekend) . And patient was waiting to go to OR  She had I/D of the left knee prepatellar bursa on 11/16 night PT was intubated this morning at 6.30am for acute hypoxic resp failure/ Multi organ failure She was made comfort care around 10 am  and she is deceased

## 2024-10-21 DEATH — deceased

## 2024-10-22 ENCOUNTER — Ambulatory Visit: Admitting: Internal Medicine

## 2024-10-23 ENCOUNTER — Inpatient Hospital Stay: Admitting: Oncology

## 2024-10-30 ENCOUNTER — Other Ambulatory Visit
